# Patient Record
Sex: Female | Born: 1940 | Race: White | Hispanic: No | Smoking: Never smoker
Health system: Southern US, Community
[De-identification: ages and names within clinical notes are randomized; demographics above are authoritative.]

## PROBLEM LIST (undated history)

## (undated) DIAGNOSIS — R569 Unspecified convulsions: Secondary | ICD-10-CM

## (undated) DIAGNOSIS — R4701 Aphasia: Secondary | ICD-10-CM

## (undated) DIAGNOSIS — K219 Gastro-esophageal reflux disease without esophagitis: Secondary | ICD-10-CM

## (undated) DIAGNOSIS — I10 Essential (primary) hypertension: Secondary | ICD-10-CM

## (undated) DIAGNOSIS — I5032 Chronic diastolic (congestive) heart failure: Secondary | ICD-10-CM

## (undated) DIAGNOSIS — R7881 Bacteremia: Secondary | ICD-10-CM

## (undated) DIAGNOSIS — E785 Hyperlipidemia, unspecified: Secondary | ICD-10-CM

## (undated) DIAGNOSIS — H3500 Unspecified background retinopathy: Secondary | ICD-10-CM

## (undated) DIAGNOSIS — Z8489 Family history of other specified conditions: Secondary | ICD-10-CM

## (undated) DIAGNOSIS — K625 Hemorrhage of anus and rectum: Secondary | ICD-10-CM

## (undated) DIAGNOSIS — G40901 Epilepsy, unspecified, not intractable, with status epilepticus: Secondary | ICD-10-CM

## (undated) DIAGNOSIS — I639 Cerebral infarction, unspecified: Secondary | ICD-10-CM

## (undated) HISTORY — DX: Cerebral infarction, unspecified: I63.9

## (undated) HISTORY — PX: OTHER SURGICAL HISTORY: SHX169

## (undated) HISTORY — PX: TONSILLECTOMY: SUR1361

## (undated) HISTORY — DX: Unspecified background retinopathy: H35.00

## (undated) HISTORY — DX: Hemorrhage of anus and rectum: K62.5

---

## 1970-08-18 HISTORY — PX: ABDOMINAL HYSTERECTOMY: SHX81

## 1970-08-18 HISTORY — PX: APPENDECTOMY: SHX54

## 1998-09-13 ENCOUNTER — Encounter: Admission: RE | Admit: 1998-09-13 | Discharge: 1998-09-13 | Payer: Self-pay | Admitting: Family Medicine

## 1998-09-13 ENCOUNTER — Encounter (INDEPENDENT_AMBULATORY_CARE_PROVIDER_SITE_OTHER): Payer: Self-pay | Admitting: *Deleted

## 1998-09-14 ENCOUNTER — Encounter: Admission: RE | Admit: 1998-09-14 | Discharge: 1998-09-14 | Payer: Self-pay | Admitting: Family Medicine

## 1998-11-09 ENCOUNTER — Ambulatory Visit (HOSPITAL_COMMUNITY): Admission: RE | Admit: 1998-11-09 | Discharge: 1998-11-09 | Payer: Self-pay

## 1998-12-31 ENCOUNTER — Encounter: Admission: RE | Admit: 1998-12-31 | Discharge: 1998-12-31 | Payer: Self-pay | Admitting: Family Medicine

## 1999-01-04 ENCOUNTER — Encounter: Admission: RE | Admit: 1999-01-04 | Discharge: 1999-01-04 | Payer: Self-pay | Admitting: Family Medicine

## 1999-01-08 ENCOUNTER — Encounter: Admission: RE | Admit: 1999-01-08 | Discharge: 1999-01-08 | Payer: Self-pay | Admitting: Sports Medicine

## 1999-02-08 ENCOUNTER — Encounter: Admission: RE | Admit: 1999-02-08 | Discharge: 1999-02-08 | Payer: Self-pay | Admitting: Family Medicine

## 1999-02-15 ENCOUNTER — Encounter: Admission: RE | Admit: 1999-02-15 | Discharge: 1999-02-15 | Payer: Self-pay | Admitting: Family Medicine

## 1999-02-22 ENCOUNTER — Encounter: Admission: RE | Admit: 1999-02-22 | Discharge: 1999-02-22 | Payer: Self-pay | Admitting: Family Medicine

## 1999-03-08 ENCOUNTER — Encounter: Admission: RE | Admit: 1999-03-08 | Discharge: 1999-03-08 | Payer: Self-pay | Admitting: Family Medicine

## 1999-03-20 ENCOUNTER — Encounter: Admission: RE | Admit: 1999-03-20 | Discharge: 1999-06-18 | Payer: Self-pay | Admitting: *Deleted

## 1999-03-25 ENCOUNTER — Encounter: Admission: RE | Admit: 1999-03-25 | Discharge: 1999-03-25 | Payer: Self-pay | Admitting: Family Medicine

## 1999-04-15 ENCOUNTER — Encounter: Admission: RE | Admit: 1999-04-15 | Discharge: 1999-04-15 | Payer: Self-pay | Admitting: Family Medicine

## 1999-05-15 ENCOUNTER — Encounter: Admission: RE | Admit: 1999-05-15 | Discharge: 1999-05-15 | Payer: Self-pay | Admitting: Family Medicine

## 1999-06-13 ENCOUNTER — Encounter: Admission: RE | Admit: 1999-06-13 | Discharge: 1999-06-13 | Payer: Self-pay | Admitting: Family Medicine

## 1999-07-24 ENCOUNTER — Encounter: Admission: RE | Admit: 1999-07-24 | Discharge: 1999-07-24 | Payer: Self-pay | Admitting: Family Medicine

## 1999-08-21 ENCOUNTER — Encounter: Admission: RE | Admit: 1999-08-21 | Discharge: 1999-08-21 | Payer: Self-pay | Admitting: Family Medicine

## 1999-10-09 ENCOUNTER — Encounter: Admission: RE | Admit: 1999-10-09 | Discharge: 1999-10-09 | Payer: Self-pay | Admitting: Family Medicine

## 1999-11-13 ENCOUNTER — Encounter: Admission: RE | Admit: 1999-11-13 | Discharge: 1999-11-13 | Payer: Self-pay | Admitting: Family Medicine

## 1999-12-17 ENCOUNTER — Encounter: Admission: RE | Admit: 1999-12-17 | Discharge: 1999-12-17 | Payer: Self-pay | Admitting: Sports Medicine

## 2000-03-16 ENCOUNTER — Encounter: Admission: RE | Admit: 2000-03-16 | Discharge: 2000-03-16 | Payer: Self-pay | Admitting: Family Medicine

## 2000-05-11 ENCOUNTER — Encounter: Admission: RE | Admit: 2000-05-11 | Discharge: 2000-05-11 | Payer: Self-pay | Admitting: *Deleted

## 2000-05-14 ENCOUNTER — Encounter: Admission: RE | Admit: 2000-05-14 | Discharge: 2000-05-14 | Payer: Self-pay | Admitting: *Deleted

## 2000-06-16 ENCOUNTER — Encounter: Admission: RE | Admit: 2000-06-16 | Discharge: 2000-06-16 | Payer: Self-pay | Admitting: Family Medicine

## 2000-06-26 ENCOUNTER — Encounter: Admission: RE | Admit: 2000-06-26 | Discharge: 2000-06-26 | Payer: Self-pay | Admitting: Family Medicine

## 2000-07-02 ENCOUNTER — Encounter: Admission: RE | Admit: 2000-07-02 | Discharge: 2000-07-02 | Payer: Self-pay | Admitting: Family Medicine

## 2000-07-16 ENCOUNTER — Encounter: Admission: RE | Admit: 2000-07-16 | Discharge: 2000-07-16 | Payer: Self-pay | Admitting: Sports Medicine

## 2000-09-16 ENCOUNTER — Encounter: Admission: RE | Admit: 2000-09-16 | Discharge: 2000-09-16 | Payer: Self-pay | Admitting: Family Medicine

## 2000-12-14 ENCOUNTER — Encounter: Admission: RE | Admit: 2000-12-14 | Discharge: 2000-12-14 | Payer: Self-pay | Admitting: Family Medicine

## 2001-03-17 ENCOUNTER — Encounter: Admission: RE | Admit: 2001-03-17 | Discharge: 2001-03-17 | Payer: Self-pay | Admitting: Family Medicine

## 2001-05-03 ENCOUNTER — Encounter: Admission: RE | Admit: 2001-05-03 | Discharge: 2001-05-03 | Payer: Self-pay | Admitting: Sports Medicine

## 2001-05-03 ENCOUNTER — Encounter: Payer: Self-pay | Admitting: Sports Medicine

## 2001-06-21 ENCOUNTER — Encounter: Admission: RE | Admit: 2001-06-21 | Discharge: 2001-06-21 | Payer: Self-pay | Admitting: Family Medicine

## 2001-07-01 ENCOUNTER — Encounter: Admission: RE | Admit: 2001-07-01 | Discharge: 2001-07-01 | Payer: Self-pay | Admitting: Family Medicine

## 2001-10-13 ENCOUNTER — Encounter: Admission: RE | Admit: 2001-10-13 | Discharge: 2001-10-13 | Payer: Self-pay | Admitting: Family Medicine

## 2002-02-04 ENCOUNTER — Encounter: Admission: RE | Admit: 2002-02-04 | Discharge: 2002-02-04 | Payer: Self-pay | Admitting: Family Medicine

## 2002-04-25 ENCOUNTER — Encounter: Admission: RE | Admit: 2002-04-25 | Discharge: 2002-04-25 | Payer: Self-pay | Admitting: Family Medicine

## 2002-05-04 ENCOUNTER — Ambulatory Visit (HOSPITAL_COMMUNITY): Admission: RE | Admit: 2002-05-04 | Discharge: 2002-05-04 | Payer: Self-pay | Admitting: Family Medicine

## 2002-07-25 ENCOUNTER — Encounter: Admission: RE | Admit: 2002-07-25 | Discharge: 2002-07-25 | Payer: Self-pay | Admitting: Family Medicine

## 2002-07-28 ENCOUNTER — Encounter: Admission: RE | Admit: 2002-07-28 | Discharge: 2002-07-28 | Payer: Self-pay | Admitting: Family Medicine

## 2002-08-19 ENCOUNTER — Encounter: Admission: RE | Admit: 2002-08-19 | Discharge: 2002-08-19 | Payer: Self-pay | Admitting: Family Medicine

## 2002-09-01 ENCOUNTER — Encounter: Admission: RE | Admit: 2002-09-01 | Discharge: 2002-09-01 | Payer: Self-pay | Admitting: Family Medicine

## 2003-01-17 ENCOUNTER — Encounter: Admission: RE | Admit: 2003-01-17 | Discharge: 2003-01-17 | Payer: Self-pay | Admitting: Sports Medicine

## 2003-12-21 ENCOUNTER — Ambulatory Visit (HOSPITAL_COMMUNITY): Admission: RE | Admit: 2003-12-21 | Discharge: 2003-12-21 | Payer: Self-pay | Admitting: Sports Medicine

## 2004-01-02 ENCOUNTER — Encounter: Admission: RE | Admit: 2004-01-02 | Discharge: 2004-01-02 | Payer: Self-pay | Admitting: Sports Medicine

## 2004-01-31 ENCOUNTER — Encounter: Admission: RE | Admit: 2004-01-31 | Discharge: 2004-01-31 | Payer: Self-pay | Admitting: Family Medicine

## 2004-03-19 ENCOUNTER — Encounter: Admission: RE | Admit: 2004-03-19 | Discharge: 2004-03-19 | Payer: Self-pay | Admitting: Family Medicine

## 2004-05-15 ENCOUNTER — Ambulatory Visit: Payer: Self-pay | Admitting: Sports Medicine

## 2004-06-26 ENCOUNTER — Ambulatory Visit: Payer: Self-pay | Admitting: Family Medicine

## 2004-07-16 ENCOUNTER — Ambulatory Visit: Payer: Self-pay | Admitting: Sports Medicine

## 2004-09-11 ENCOUNTER — Ambulatory Visit: Payer: Self-pay | Admitting: Sports Medicine

## 2004-11-26 ENCOUNTER — Ambulatory Visit: Payer: Self-pay | Admitting: Sports Medicine

## 2005-01-21 ENCOUNTER — Ambulatory Visit: Payer: Self-pay | Admitting: Sports Medicine

## 2005-02-05 ENCOUNTER — Ambulatory Visit (HOSPITAL_COMMUNITY): Admission: RE | Admit: 2005-02-05 | Discharge: 2005-02-05 | Payer: Self-pay

## 2005-04-25 ENCOUNTER — Ambulatory Visit (HOSPITAL_COMMUNITY): Admission: RE | Admit: 2005-04-25 | Discharge: 2005-04-25 | Payer: Self-pay | Admitting: Internal Medicine

## 2005-05-20 ENCOUNTER — Ambulatory Visit: Payer: Self-pay | Admitting: Sports Medicine

## 2005-09-30 ENCOUNTER — Ambulatory Visit: Payer: Self-pay | Admitting: Sports Medicine

## 2005-10-02 ENCOUNTER — Ambulatory Visit: Payer: Self-pay | Admitting: Family Medicine

## 2005-10-15 ENCOUNTER — Encounter: Admission: RE | Admit: 2005-10-15 | Discharge: 2005-10-15 | Payer: Self-pay | Admitting: Gastroenterology

## 2006-05-26 ENCOUNTER — Ambulatory Visit (HOSPITAL_COMMUNITY): Admission: RE | Admit: 2006-05-26 | Discharge: 2006-05-26 | Payer: Self-pay | Admitting: Sports Medicine

## 2006-10-15 DIAGNOSIS — I10 Essential (primary) hypertension: Secondary | ICD-10-CM | POA: Insufficient documentation

## 2006-10-15 DIAGNOSIS — E1165 Type 2 diabetes mellitus with hyperglycemia: Secondary | ICD-10-CM

## 2006-10-15 DIAGNOSIS — F329 Major depressive disorder, single episode, unspecified: Secondary | ICD-10-CM | POA: Insufficient documentation

## 2006-10-15 DIAGNOSIS — E785 Hyperlipidemia, unspecified: Secondary | ICD-10-CM

## 2006-10-15 DIAGNOSIS — IMO0001 Reserved for inherently not codable concepts without codable children: Secondary | ICD-10-CM | POA: Insufficient documentation

## 2006-10-15 HISTORY — DX: Hyperlipidemia, unspecified: E78.5

## 2006-10-16 ENCOUNTER — Encounter (INDEPENDENT_AMBULATORY_CARE_PROVIDER_SITE_OTHER): Payer: Self-pay | Admitting: *Deleted

## 2007-03-17 ENCOUNTER — Encounter: Payer: Self-pay | Admitting: Family Medicine

## 2007-04-12 ENCOUNTER — Encounter: Payer: Self-pay | Admitting: Family Medicine

## 2007-04-26 ENCOUNTER — Encounter: Payer: Self-pay | Admitting: Family Medicine

## 2007-04-26 ENCOUNTER — Ambulatory Visit: Payer: Self-pay | Admitting: Family Medicine

## 2007-04-26 LAB — CONVERTED CEMR LAB
ALT: 19 units/L (ref 0–35)
AST: 22 units/L (ref 0–37)
CO2: 23 meq/L (ref 19–32)
Calcium: 9.5 mg/dL (ref 8.4–10.5)
Creatinine, Ser: 0.64 mg/dL (ref 0.40–1.20)
Potassium: 4.1 meq/L (ref 3.5–5.3)
Sodium: 137 meq/L (ref 135–145)

## 2007-04-27 ENCOUNTER — Encounter: Payer: Self-pay | Admitting: Family Medicine

## 2007-04-28 ENCOUNTER — Telehealth: Payer: Self-pay | Admitting: Family Medicine

## 2007-05-14 ENCOUNTER — Encounter: Payer: Self-pay | Admitting: Family Medicine

## 2007-05-27 ENCOUNTER — Ambulatory Visit (HOSPITAL_COMMUNITY): Admission: RE | Admit: 2007-05-27 | Discharge: 2007-05-27 | Payer: Self-pay | Admitting: Family Medicine

## 2007-09-27 ENCOUNTER — Encounter: Payer: Self-pay | Admitting: Family Medicine

## 2009-02-26 ENCOUNTER — Ambulatory Visit (HOSPITAL_COMMUNITY): Admission: RE | Admit: 2009-02-26 | Discharge: 2009-02-26 | Payer: Self-pay | Admitting: Internal Medicine

## 2009-05-11 ENCOUNTER — Encounter: Payer: Self-pay | Admitting: Family Medicine

## 2010-03-02 ENCOUNTER — Emergency Department (HOSPITAL_COMMUNITY): Admission: EM | Admit: 2010-03-02 | Discharge: 2010-03-02 | Payer: Self-pay | Admitting: Emergency Medicine

## 2010-03-29 ENCOUNTER — Ambulatory Visit (HOSPITAL_COMMUNITY): Admission: RE | Admit: 2010-03-29 | Discharge: 2010-03-29 | Payer: Self-pay | Admitting: Internal Medicine

## 2010-09-19 NOTE — Miscellaneous (Signed)
Summary: no longer pt here-no refills  Clinical Lists Changes called pt to see what she is taking & who is rxing it. she sees an endocrinologist. she went off avandia years ago when she first heard there were problems with it. she sees another md & will not be coming back here. FYI to pcp.Golden Circle RN  May 11, 2009 1:58 PM

## 2011-09-09 ENCOUNTER — Emergency Department (INDEPENDENT_AMBULATORY_CARE_PROVIDER_SITE_OTHER)
Admission: EM | Admit: 2011-09-09 | Discharge: 2011-09-09 | Disposition: A | Discharge status: Home / Self Care | Payer: Medicare Other | Source: Home / Self Care

## 2011-09-09 ENCOUNTER — Encounter (HOSPITAL_COMMUNITY): Payer: Self-pay | Admitting: Emergency Medicine

## 2011-09-09 ENCOUNTER — Emergency Department (INDEPENDENT_AMBULATORY_CARE_PROVIDER_SITE_OTHER): Payer: Medicare Other

## 2011-09-09 DIAGNOSIS — I10 Essential (primary) hypertension: Secondary | ICD-10-CM

## 2011-09-09 DIAGNOSIS — S42309A Unspecified fracture of shaft of humerus, unspecified arm, initial encounter for closed fracture: Secondary | ICD-10-CM

## 2011-09-09 DIAGNOSIS — S42353A Displaced comminuted fracture of shaft of humerus, unspecified arm, initial encounter for closed fracture: Secondary | ICD-10-CM

## 2011-09-09 HISTORY — DX: Essential (primary) hypertension: I10

## 2011-09-09 MED ORDER — OLMESARTAN MEDOXOMIL 20 MG PO TABS: 20.0000 mg | ORAL_TABLET | Freq: Every day | ORAL | Status: DC

## 2011-09-09 MED ORDER — TRAMADOL HCL 50 MG PO TABS: 50.0000 mg | ORAL_TABLET | Freq: Four times a day (QID) | ORAL | Status: AC | PRN

## 2011-09-09 NOTE — ED Notes (Signed)
Bed:[facility]05<BR> Expected date:<BR> Expected time:<BR> Means of arrival:<BR> Comments:<BR>

## 2011-09-09 NOTE — ED Provider Notes (Signed)
History     CSN: [id number]  Arrival date & time 09/09/11  1656   None     Chief Complaint  Patient presents with  . Fall  . Arm Injury    (Consider location/radiation/quality/duration/timing/severity/associated sxs/prior treatment) HPI Comments: Pt states she slipped and fell outside 2 days ago while chasing her dog. She noticed right away that she was unable to carry the dog with her Lt arm after the fall and since has bruising, swelling and mild pain. She states that most of her discomfort is at night trying to find a comfortable position. She also is unable to move her shoulder due to discomfort. She has been taking Ibuprofen for discomfort. Pt has a hx of DM and HTN. She has not taken her medications in approx 6 mos. She states that she monitors her BP at home and is not this elevated - systolic usually 409W to 140s. She also checks her blood sugars periodically and run 110 to 120s. She wants to find a new PCP, though admits she has not made an effort to do so. She denies chest pain, dyspnea, HA or dizziness.    Past Medical History  Diagnosis Date  . Diabetes mellitus   . Hypertension     History reviewed. No pertinent past surgical history.  No family history on file.  History  Substance Use Topics  . Smoking status: Never Smoker   . Smokeless tobacco: Not on file  . Alcohol Use: No    OB History    Grav Para Term Preterm Abortions TAB SAB Ect Mult Living                  Review of Systems  Musculoskeletal: Positive for joint swelling.  Skin: Negative for wound.  Neurological: Negative for dizziness, numbness and headaches.    Allergies  Codeine  Home Medications   Current Outpatient Rx  Name Route Sig Dispense Refill  . METFORMIN HCL 1000 MG PO TABS Oral Take 1,000 mg by mouth 2 (two) times daily with a meal. HASN'T TAKEN IN 6 MNTHS    . OLMESARTAN MEDOXOMIL 20 MG PO TABS Oral Take 1 tablet (20 mg total) by mouth daily. 14 tablet 0  . TRAMADOL HCL 50  MG PO TABS Oral Take 1 tablet (50 mg total) by mouth every 6 (six) hours as needed for pain. 12 tablet 0    BP 202/102  Pulse 110  Temp(Src) 97.7 F (36.5 C) (Oral)  Resp 16  SpO2 95%  Physical Exam  Nursing note and vitals reviewed. Constitutional: She appears well-developed and well-nourished. No distress.  Cardiovascular: Normal rate, regular rhythm and normal heart sounds.   Pulses:      Radial pulses are 2+ on the right side, and 2+ on the left side.  Pulmonary/Chest: Effort normal and breath sounds normal. No respiratory distress.  Musculoskeletal:       Left shoulder: She exhibits decreased range of motion, bony tenderness (TTP superior humerus and humeral head), swelling and decreased strength. She exhibits no laceration and normal pulse.  Neurological: She is alert.  Skin: Skin is warm and dry. No erythema.  Psychiatric: She has a normal mood and affect.    ED Course  Procedures (including critical care time)  Labs Reviewed - No data to display No results found.   1. Comminuted fracture of humerus   2. Hypertension       MDM  Xray reviewed by myself and radiologist.  Discussed with Dr Luiz Blare.  Advised to sling pt and will f/u in office.  Encouraged pt f/u with current PCP regarding BP and DM until she is able to obtain a new PCP as she desires.        Melody Comas, [place] 09/18/11 0830

## 2011-09-09 NOTE — ED Notes (Signed)
HERE WITH LEFT UPPER ARM BRUISING AND PAIN S/P FALL X 2 DYS AGO.PT STATES SHE WAS RUNNING AFTER DOG IN [place] WHEN SHE SLIPPED ON PATCHY ICE AND FELL STRAIGHT ON LEFT ARM.UNABLE TO FULLY EXTEND ARM.PT TRIED IBUPROFEN AND REPOSITIONING BUT NO RELIEF.WENT TO [facility] AND WAS TOLD TO COME HERE.NO HEAD OR BACK PAIN REPORTED.PT ALSO HAS HX HTN AND DIABETES AND HASN'T TAKEN MEDS IN 6 MNTHS.NO C/O H/A OR CP.BP 191/106

## 2011-09-19 NOTE — ED Provider Notes (Signed)
Medical screening examination/treatment/procedure(s) were performed by non-physician practitioner and as supervising physician I was immediately available for consultation/collaboration.  Raynald Blend, MD 09/19/11 [phone]

## 2013-05-06 ENCOUNTER — Ambulatory Visit (INDEPENDENT_AMBULATORY_CARE_PROVIDER_SITE_OTHER): Payer: Medicare Other | Admitting: Family

## 2013-05-06 ENCOUNTER — Encounter: Payer: Self-pay | Admitting: Family

## 2013-05-06 VITALS — BP 158/86 | HR 95 | Temp 98.5°F | Resp 16 | Ht <= 58 in | Wt 120.0 lb

## 2013-05-06 DIAGNOSIS — K219 Gastro-esophageal reflux disease without esophagitis: Secondary | ICD-10-CM

## 2013-05-06 DIAGNOSIS — Z23 Encounter for immunization: Secondary | ICD-10-CM

## 2013-05-06 DIAGNOSIS — I10 Essential (primary) hypertension: Secondary | ICD-10-CM

## 2013-05-06 DIAGNOSIS — F339 Major depressive disorder, recurrent, unspecified: Secondary | ICD-10-CM

## 2013-05-06 DIAGNOSIS — E785 Hyperlipidemia, unspecified: Secondary | ICD-10-CM

## 2013-05-06 DIAGNOSIS — E119 Type 2 diabetes mellitus without complications: Secondary | ICD-10-CM

## 2013-05-06 HISTORY — DX: Gastro-esophageal reflux disease without esophagitis: K21.9

## 2013-05-06 LAB — HEMOGLOBIN A1C: Mean Plasma Glucose: 260 mg/dL — ABNORMAL HIGH (ref <117)

## 2013-05-06 LAB — BASIC METABOLIC PANEL
BUN: 7 mg/dL (ref 6–23)
CO2: 30 mEq/L (ref 19–32)
Glucose, Bld: 192 mg/dL — ABNORMAL HIGH (ref 70–99)
Sodium: 139 mEq/L (ref 135–145)

## 2013-05-06 MED ORDER — AMLODIPINE BESYLATE 5 MG PO TABS: 5.0000 mg | ORAL_TABLET | Freq: Every day | ORAL | Status: DC

## 2013-05-06 NOTE — Assessment & Plan Note (Signed)
BP high here today and she reports that bp is "always high." Will add amlodipine to hyzaar.

## 2013-05-06 NOTE — Assessment & Plan Note (Signed)
Pt reports depression is currently well controlled and denies significant pmhx of depression.

## 2013-05-06 NOTE — Progress Notes (Signed)
Subjective:    Patient ID: April [name], female    DOB: 06/12/1941, 72 y.o.   MRN: [id number]  HPI  April [name] is a 72 yr old female who presents today to establish care. Her previous provider Dr. Bascom Levels, retired.  1) DM2- Currently maintained on metformin. Reports that she was diagnosed about 15 years ago. Reports that this is well controlled.    2) HTN- She is maintained on hyzaar. She denies cp/sob or swellings. She reports that her blood pressure "runs high."    3) GERD- reports that she uses prilosec otc prn.  4) Depression- reports that she had some issue after retiring, but that this is not an issue.    5) Hyperlipidemia- non fasting, has never been on statin.   Review of Systems  Constitutional: Negative for unexpected weight change.  HENT: Negative for congestion.        Reports that she has some trouble hearing a word or two in the movies- declines audiology referral at this time  Eyes: Negative for visual disturbance.  Respiratory: Negative for shortness of breath.   Cardiovascular: Negative for chest pain.  Gastrointestinal: Negative for nausea and vomiting.  Genitourinary: Negative for dysuria and frequency.  Musculoskeletal: Negative for myalgias and arthralgias.  Skin: Negative for rash.  Neurological: Negative for headaches.  Hematological: Negative for adenopathy.  Psychiatric/Behavioral:       See HPI  Denies anxiety   Past Medical History  Diagnosis Date  . Diabetes mellitus   . Hypertension     History   Social History  . Marital Status: Widowed    Spouse Name: N/A    Number of Children: N/A  . Years of Education: N/A   Occupational History  . Not on file.   Social History Main Topics  . Smoking status: Never Smoker   . Smokeless tobacco: Never Used  . Alcohol Use: No  . Drug Use: No  . Sexual Activity: Not on file   Other Topics Concern  . Not on file   Social History Narrative   Widowed, husband died in 01/21/2002.   1 son age  33- lives in [place]   Enjoys reading, playing with her dogs, walking, movies (likes Technical brewer)   1 year of college   Retired from [employer], [employer]          Past Surgical History  Procedure Laterality Date  . Appendectomy  1972  . Abdominal hysterectomy  1972    Family History  Problem Relation Age of Onset  . Arthritis Maternal Grandmother   . Heart disease Maternal Grandmother 80    Allergies  Allergen Reactions  . Codeine Nausea And Vomiting  . Sulfa Antibiotics Nausea And Vomiting    No current outpatient prescriptions on file prior to visit.   No current facility-administered medications on file prior to visit.    BP 158/86  Pulse 95  Temp(Src) 98.5 F (36.9 C) (Oral)  Resp 16  Ht 4\' 10"  (1.473 m)  Wt 120 lb 0.6 oz (54.45 kg)  BMI 25.1 kg/m2  SpO2 98%  LMP 08/18/1970        Objective:   Physical Exam  Constitutional: She is oriented to person, place, and time. She appears well-developed and well-nourished. No distress.  HENT:  Head: Normocephalic and atraumatic.  Cardiovascular: Normal rate and regular rhythm.   No murmur heard. Pulmonary/Chest: Effort normal and breath sounds normal. No respiratory distress. She has no wheezes. She has no rales. She exhibits  no tenderness.  Musculoskeletal: She exhibits no edema.  Lymphadenopathy:    She has no cervical adenopathy.  Neurological: She is alert and oriented to person, place, and time.  Psychiatric: She has a normal mood and affect. Her behavior is normal. Judgment and thought content normal.          Assessment & Plan:

## 2013-05-06 NOTE — Assessment & Plan Note (Signed)
Not on statin, plan flp next visit.

## 2013-05-06 NOTE — Assessment & Plan Note (Signed)
GERD symptoms stable on PRN prilosec.

## 2013-05-06 NOTE — Patient Instructions (Addendum)
Please complete your lab work prior to leaving. Start amlodipine to help with your blood pressure. Follow up in 1 month for a fasting wellness visit. Welcome to [facility]!

## 2013-05-06 NOTE — Assessment & Plan Note (Signed)
Will refer for dm eye exam, obtain A1C, urine microalbumin, add ASA for cardiac prevention, continue metformin.

## 2013-05-07 LAB — MICROALBUMIN / CREATININE URINE RATIO
Creatinine, Urine: 176.9 mg/dL
Microalb Creat Ratio: 69.1 mg/g — ABNORMAL HIGH (ref 0.0–30.0)
Microalb, Ur: 12.22 mg/dL — ABNORMAL HIGH (ref 0.00–1.89)

## 2013-05-08 ENCOUNTER — Telehealth: Payer: Self-pay | Admitting: Family

## 2013-05-08 DIAGNOSIS — E876 Hypokalemia: Secondary | ICD-10-CM

## 2013-05-08 MED ORDER — SITAGLIPTIN PHOSPHATE 100 MG PO TABS: 100.0000 mg | ORAL_TABLET | Freq: Every day | ORAL | Status: DC

## 2013-05-08 MED ORDER — METFORMIN HCL 500 MG PO TABS: 1000.0000 mg | ORAL_TABLET | Freq: Two times a day (BID) | ORAL | Status: DC

## 2013-05-08 MED ORDER — POTASSIUM CHLORIDE CRYS ER 20 MEQ PO TBCR: 20.0000 meq | EXTENDED_RELEASE_TABLET | Freq: Every day | ORAL | Status: DC

## 2013-05-08 NOTE — Telephone Encounter (Signed)
Please call pt and let her know that her diabetes is very poorly controlled.  I would like for her to increase her metformin from 500 mg bid to 1000mg  bid.  Also, I would like her to start [place] once daily. Potassium is low.  I would like her to add kdur once daily. Repeat bmet in 1 week, dx is hypokalemia.

## 2013-05-09 ENCOUNTER — Other Ambulatory Visit: Payer: Self-pay | Admitting: Family Medicine

## 2013-05-09 NOTE — Telephone Encounter (Signed)
Januvia is 100mg .

## 2013-05-09 NOTE — Telephone Encounter (Signed)
What mg of Januvia?

## 2013-05-10 NOTE — Telephone Encounter (Signed)
Notified pt and she voices understanding. Lab order placed.

## 2013-05-18 ENCOUNTER — Encounter: Payer: Self-pay | Admitting: Family

## 2013-05-18 LAB — BASIC METABOLIC PANEL
CO2: 29 mEq/L (ref 19–32)
Calcium: 9.4 mg/dL (ref 8.4–10.5)
Potassium: 4 mEq/L (ref 3.5–5.3)

## 2013-05-31 ENCOUNTER — Ambulatory Visit (INDEPENDENT_AMBULATORY_CARE_PROVIDER_SITE_OTHER): Payer: Medicare Other | Admitting: Family

## 2013-05-31 ENCOUNTER — Ambulatory Visit: Payer: Medicare Other | Admitting: Family

## 2013-05-31 ENCOUNTER — Encounter: Payer: Self-pay | Admitting: Family

## 2013-05-31 VITALS — BP 142/88 | HR 99 | Temp 97.9°F | Resp 16 | Ht <= 58 in | Wt 120.0 lb

## 2013-05-31 DIAGNOSIS — E785 Hyperlipidemia, unspecified: Secondary | ICD-10-CM

## 2013-05-31 DIAGNOSIS — I10 Essential (primary) hypertension: Secondary | ICD-10-CM

## 2013-05-31 DIAGNOSIS — E119 Type 2 diabetes mellitus without complications: Secondary | ICD-10-CM

## 2013-05-31 LAB — HEPATIC FUNCTION PANEL
ALT: 10 U/L (ref 0–35)
Indirect Bilirubin: 0.3 mg/dL (ref 0.0–0.9)

## 2013-05-31 LAB — LIPID PANEL
Cholesterol: 155 mg/dL (ref 0–200)
Total CHOL/HDL Ratio: 4.2 Ratio
Triglycerides: 335 mg/dL — ABNORMAL HIGH (ref <150)

## 2013-05-31 MED ORDER — AMLODIPINE BESYLATE 10 MG PO TABS: 10.0000 mg | ORAL_TABLET | Freq: Every day | ORAL | Status: DC

## 2013-05-31 NOTE — Patient Instructions (Addendum)
Please schedule a wellness visit at the front desk after 12/19. Increase amlodipine form 5 mg to 10 mg.   Follow up in 1 month for a nurse visit to check your blood pressure.

## 2013-05-31 NOTE — Assessment & Plan Note (Signed)
She is fasting today.  Obtain lipid panel and lft.

## 2013-05-31 NOTE — Assessment & Plan Note (Signed)
Tolerating [place]. Continue same along with metformin. Plan A1C next visit.  Advised pt to start checking sugars and check at least once a day.

## 2013-05-31 NOTE — Assessment & Plan Note (Signed)
Improving but still above goal of 130/80. Increase amlodipine from 5 to 10mg .

## 2013-05-31 NOTE — Progress Notes (Signed)
Subjective:    Patient ID: April [name], female    DOB: 23-Jul-1941, 72 y.o.   MRN: [id number]  HPI  April [name] is a 72 yr old female who presents today for follow up of her HTN. Last visit amlodipine was added to her hyzaar.  Tolerating amlodipine without difficulty.     DM2- Started januvia- not checking sugars. Tolerating without difficulty.   Hyperlipidemia- reports that she is fasting today. Would like cholesterol checked.   Review of Systems  Respiratory: Negative for shortness of breath.   Cardiovascular: Negative for chest pain and leg swelling.   Past Medical History  Diagnosis Date  . Diabetes mellitus   . Hypertension     History   Social History  . Marital Status: Widowed    Spouse Name: N/A    Number of Children: N/A  . Years of Education: N/A   Occupational History  . Not on file.   Social History Main Topics  . Smoking status: Never Smoker   . Smokeless tobacco: Never Used  . Alcohol Use: No  . Drug Use: No  . Sexual Activity: Not on file   Other Topics Concern  . Not on file   Social History Narrative   Widowed, husband died in 01-20-2002.   1 son age 36- lives in [place]   Enjoys reading, playing with her dogs, walking, movies (likes Technical brewer)   1 year of college   Retired from [employer], [employer]          Past Surgical History  Procedure Laterality Date  . Appendectomy  1972  . Abdominal hysterectomy  1972    Family History  Problem Relation Age of Onset  . Arthritis Maternal Grandmother   . Heart disease Maternal Grandmother 80    Allergies  Allergen Reactions  . Codeine Nausea And Vomiting  . Sulfa Antibiotics Nausea And Vomiting    Current Outpatient Prescriptions on File Prior to Visit  Medication Sig Dispense Refill  . aspirin EC 81 MG tablet Take 81 mg by mouth daily.      Marland Kitchen losartan-hydrochlorothiazide (HYZAAR) 50-12.5 MG per tablet Take 1 tablet by mouth daily.      . metFORMIN (GLUCOPHAGE) 500 MG tablet  Take 2 tablets (1,000 mg total) by mouth 2 (two) times daily with a meal.  120 tablet  2  . omeprazole (PRILOSEC OTC) 20 MG tablet Take 20 mg by mouth as needed.      . potassium chloride SA (K-DUR,KLOR-CON) 20 MEQ tablet Take 1 tablet (20 mEq total) by mouth daily.  30 tablet  3  . sitaGLIPtin (JANUVIA) 100 MG tablet Take 1 tablet (100 mg total) by mouth daily.  30 tablet  2   No current facility-administered medications on file prior to visit.    BP 142/88  Pulse 99  Temp(Src) 97.9 F (36.6 C) (Oral)  Resp 16  Ht 4\' 10"  (1.473 m)  Wt 120 lb (54.432 kg)  BMI 25.09 kg/m2  SpO2 99%  LMP 08/18/1970       Objective:   Physical Exam  Constitutional: She is oriented to person, place, and time. She appears well-developed and well-nourished. No distress.  HENT:  Head: Normocephalic and atraumatic.  Cardiovascular: Normal rate and regular rhythm.   No murmur heard. Pulmonary/Chest: Effort normal and breath sounds normal. No respiratory distress. She has no wheezes. She has no rales. She exhibits no tenderness.  Musculoskeletal: She exhibits no edema.  Neurological: She is alert and oriented to  person, place, and time.  Psychiatric: She has a normal mood and affect. Her behavior is normal. Judgment and thought content normal.          Assessment & Plan:   BP Readings from Last 3 Encounters:  05/31/13 142/88  05/06/13 158/86  09/09/11 202/102

## 2013-06-02 ENCOUNTER — Telehealth: Payer: Self-pay | Admitting: Family

## 2013-06-02 DIAGNOSIS — E781 Pure hyperglyceridemia: Secondary | ICD-10-CM

## 2013-06-02 NOTE — Telephone Encounter (Signed)
Triglycerides are high.  Avoid concentrated sweets, white fluffy carbs. Add fish oil 2000mg  twice daily. Repeat flp in 3 months, dx hypertriglyceridemia.

## 2013-06-03 NOTE — Telephone Encounter (Signed)
Left message on voicemail to return my call.  

## 2013-06-03 NOTE — Telephone Encounter (Signed)
Notified pt and she voices understanding. Lab order entered. 

## 2013-06-06 ENCOUNTER — Encounter: Payer: Self-pay | Admitting: Family

## 2013-06-06 DIAGNOSIS — H3500 Unspecified background retinopathy: Secondary | ICD-10-CM

## 2013-06-06 HISTORY — DX: Unspecified background retinopathy: H35.00

## 2013-07-01 ENCOUNTER — Ambulatory Visit: Payer: Medicare Other

## 2013-07-27 ENCOUNTER — Telehealth: Payer: Self-pay | Admitting: Family

## 2013-07-27 MED ORDER — LOSARTAN POTASSIUM-HCTZ 50-12.5 MG PO TABS: 1.0000 | ORAL_TABLET | Freq: Every day | ORAL | Status: DC

## 2013-07-27 NOTE — Telephone Encounter (Signed)
Losartan hctz 50-12.5 mg tab take 1 tablet by mouth every day qty 30

## 2013-07-27 NOTE — Telephone Encounter (Signed)
Reill sent to pharmacy.

## 2013-08-08 ENCOUNTER — Ambulatory Visit: Payer: Medicare Other | Admitting: Family

## 2013-08-15 ENCOUNTER — Other Ambulatory Visit: Payer: Self-pay | Admitting: Family

## 2013-08-15 DIAGNOSIS — I10 Essential (primary) hypertension: Secondary | ICD-10-CM

## 2014-02-28 ENCOUNTER — Telehealth: Payer: Self-pay

## 2014-02-28 DIAGNOSIS — E785 Hyperlipidemia, unspecified: Secondary | ICD-10-CM

## 2014-02-28 DIAGNOSIS — E119 Type 2 diabetes mellitus without complications: Secondary | ICD-10-CM

## 2014-02-28 NOTE — Telephone Encounter (Signed)
Diabetic Bundle- Left a detailed message on patients vm asking her to give [place] a call.  Pt needs an appt with melissa and to get ldl and a1c checked.  Labs ordered

## 2014-04-17 ENCOUNTER — Telehealth: Payer: Self-pay | Admitting: Family

## 2014-04-17 NOTE — Telephone Encounter (Signed)
Tried to contact patient to schedule cpe.  Looks like last cpe was 05/06/2013.  Phone rang no answer or answering service.

## 2014-08-18 HISTORY — PX: EYE SURGERY: SHX253

## 2014-10-21 ENCOUNTER — Inpatient Hospital Stay (HOSPITAL_COMMUNITY)
Admission: EM | Admit: 2014-10-21 | Discharge: 2014-10-25 | DRG: 066 | Disposition: A | Discharge status: Skilled Nursing Facility | Payer: Medicare HMO | Attending: Internal Medicine | Admitting: Internal Medicine

## 2014-10-21 ENCOUNTER — Emergency Department (HOSPITAL_COMMUNITY): Payer: Medicare HMO

## 2014-10-21 ENCOUNTER — Encounter (HOSPITAL_COMMUNITY): Payer: Self-pay | Admitting: Physical Medicine and Rehabilitation

## 2014-10-21 ENCOUNTER — Inpatient Hospital Stay (HOSPITAL_COMMUNITY): Payer: Medicare HMO

## 2014-10-21 DIAGNOSIS — K219 Gastro-esophageal reflux disease without esophagitis: Secondary | ICD-10-CM | POA: Diagnosis present

## 2014-10-21 DIAGNOSIS — E1165 Type 2 diabetes mellitus with hyperglycemia: Secondary | ICD-10-CM | POA: Diagnosis present

## 2014-10-21 DIAGNOSIS — I639 Cerebral infarction, unspecified: Secondary | ICD-10-CM | POA: Diagnosis not present

## 2014-10-21 DIAGNOSIS — E785 Hyperlipidemia, unspecified: Secondary | ICD-10-CM | POA: Insufficient documentation

## 2014-10-21 DIAGNOSIS — Z7951 Long term (current) use of inhaled steroids: Secondary | ICD-10-CM | POA: Diagnosis not present

## 2014-10-21 DIAGNOSIS — J40 Bronchitis, not specified as acute or chronic: Secondary | ICD-10-CM | POA: Diagnosis present

## 2014-10-21 DIAGNOSIS — I10 Essential (primary) hypertension: Secondary | ICD-10-CM | POA: Diagnosis present

## 2014-10-21 DIAGNOSIS — R7881 Bacteremia: Secondary | ICD-10-CM | POA: Diagnosis not present

## 2014-10-21 DIAGNOSIS — I6340 Cerebral infarction due to embolism of unspecified cerebral artery: Principal | ICD-10-CM | POA: Diagnosis present

## 2014-10-21 DIAGNOSIS — F802 Mixed receptive-expressive language disorder: Secondary | ICD-10-CM | POA: Diagnosis present

## 2014-10-21 DIAGNOSIS — R4701 Aphasia: Secondary | ICD-10-CM | POA: Diagnosis present

## 2014-10-21 DIAGNOSIS — R1310 Dysphagia, unspecified: Secondary | ICD-10-CM | POA: Diagnosis present

## 2014-10-21 DIAGNOSIS — R339 Retention of urine, unspecified: Secondary | ICD-10-CM | POA: Diagnosis present

## 2014-10-21 DIAGNOSIS — E876 Hypokalemia: Secondary | ICD-10-CM | POA: Diagnosis present

## 2014-10-21 DIAGNOSIS — I739 Peripheral vascular disease, unspecified: Secondary | ICD-10-CM | POA: Diagnosis present

## 2014-10-21 DIAGNOSIS — R569 Unspecified convulsions: Secondary | ICD-10-CM | POA: Insufficient documentation

## 2014-10-21 DIAGNOSIS — Z7982 Long term (current) use of aspirin: Secondary | ICD-10-CM

## 2014-10-21 DIAGNOSIS — IMO0001 Reserved for inherently not codable concepts without codable children: Secondary | ICD-10-CM | POA: Diagnosis present

## 2014-10-21 DIAGNOSIS — E11319 Type 2 diabetes mellitus with unspecified diabetic retinopathy without macular edema: Secondary | ICD-10-CM | POA: Diagnosis present

## 2014-10-21 DIAGNOSIS — R509 Fever, unspecified: Secondary | ICD-10-CM

## 2014-10-21 DIAGNOSIS — R4182 Altered mental status, unspecified: Secondary | ICD-10-CM

## 2014-10-21 DIAGNOSIS — G40901 Epilepsy, unspecified, not intractable, with status epilepticus: Secondary | ICD-10-CM | POA: Diagnosis present

## 2014-10-21 DIAGNOSIS — G9340 Encephalopathy, unspecified: Secondary | ICD-10-CM | POA: Insufficient documentation

## 2014-10-21 DIAGNOSIS — I340 Nonrheumatic mitral (valve) insufficiency: Secondary | ICD-10-CM | POA: Diagnosis not present

## 2014-10-21 DIAGNOSIS — I6330 Cerebral infarction due to thrombosis of unspecified cerebral artery: Secondary | ICD-10-CM | POA: Insufficient documentation

## 2014-10-21 DIAGNOSIS — I38 Endocarditis, valve unspecified: Secondary | ICD-10-CM

## 2014-10-21 HISTORY — DX: Cerebral infarction, unspecified: I63.9

## 2014-10-21 LAB — COMPREHENSIVE METABOLIC PANEL
ALK PHOS: 68 U/L (ref 39–117)
ALT: 14 U/L (ref 0–35)
AST: 17 U/L (ref 0–37)
Albumin: 3.7 g/dL (ref 3.5–5.2)
Anion gap: 13 (ref 5–15)
BUN: 14 mg/dL (ref 6–23)
CALCIUM: 9.5 mg/dL (ref 8.4–10.5)
CO2: 34 mmol/L — ABNORMAL HIGH (ref 19–32)
Chloride: 90 mmol/L — ABNORMAL LOW (ref 96–112)
Creatinine, Ser: 0.64 mg/dL (ref 0.50–1.10)
GFR, EST NON AFRICAN AMERICAN: 86 mL/min — AB (ref >90)
Glucose, Bld: 364 mg/dL — ABNORMAL HIGH (ref 70–99)
POTASSIUM: 2.8 mmol/L — AB (ref 3.5–5.1)
Sodium: 137 mmol/L (ref 135–145)
Total Bilirubin: 1 mg/dL (ref 0.3–1.2)
Total Protein: 7.9 g/dL (ref 6.0–8.3)

## 2014-10-21 LAB — PROTIME-INR
INR: 1.03 (ref 0.00–1.49)
PROTHROMBIN TIME: 13.6 s (ref 11.6–15.2)

## 2014-10-21 LAB — CBC WITH DIFFERENTIAL/PLATELET
BASOS ABS: 0 10*3/uL (ref 0.0–0.1)
BASOS PCT: 0 % (ref 0–1)
EOS ABS: 0 10*3/uL (ref 0.0–0.7)
Eosinophils Relative: 0 % (ref 0–5)
HCT: 44.1 % (ref 36.0–46.0)
Hemoglobin: 15.6 g/dL — ABNORMAL HIGH (ref 12.0–15.0)
Lymphocytes Relative: 29 % (ref 12–46)
Lymphs Abs: 3.7 10*3/uL (ref 0.7–4.0)
MCH: 32 pg (ref 26.0–34.0)
MCHC: 35.4 g/dL (ref 30.0–36.0)
MCV: 90.4 fL (ref 78.0–100.0)
MONO ABS: 1.5 10*3/uL — AB (ref 0.1–1.0)
MONOS PCT: 12 % (ref 3–12)
NEUTROS PCT: 59 % (ref 43–77)
Neutro Abs: 7.2 10*3/uL (ref 1.7–7.7)
Platelets: 439 10*3/uL — ABNORMAL HIGH (ref 150–400)
RBC: 4.88 MIL/uL (ref 3.87–5.11)
RDW: 13.1 % (ref 11.5–15.5)
WBC: 12.4 10*3/uL — AB (ref 4.0–10.5)

## 2014-10-21 LAB — CBG MONITORING, ED: Glucose-Capillary: 327 mg/dL — ABNORMAL HIGH (ref 70–99)

## 2014-10-21 LAB — APTT: aPTT: 29 seconds (ref 24–37)

## 2014-10-21 MED ORDER — INSULIN ASPART 100 UNIT/ML ~~LOC~~ SOLN
0.0000 [IU] | SUBCUTANEOUS | Status: DC
  Administered 2014-10-21: 9 [IU] via SUBCUTANEOUS
  Administered 2014-10-22 (×2): 2 [IU] via SUBCUTANEOUS
  Administered 2014-10-22: 7 [IU] via SUBCUTANEOUS
  Administered 2014-10-22: 2 [IU] via SUBCUTANEOUS
  Filled 2014-10-21: qty 1

## 2014-10-21 MED ORDER — STROKE: EARLY STAGES OF RECOVERY BOOK
Freq: Once | Status: AC
  Administered 2014-10-22: 05:00:00
  Filled 2014-10-21: qty 1

## 2014-10-21 MED ORDER — ASPIRIN 325 MG PO TABS
325.0000 mg | ORAL_TABLET | Freq: Every day | ORAL | Status: DC
  Administered 2014-10-23 – 2014-10-25 (×3): 325 mg via ORAL
  Filled 2014-10-21 (×3): qty 1

## 2014-10-21 MED ORDER — LABETALOL HCL 5 MG/ML IV SOLN
10.0000 mg | INTRAVENOUS | Status: DC | PRN
  Administered 2014-10-21: 20 mg via INTRAVENOUS
  Administered 2014-10-23: 10 mg via INTRAVENOUS
  Filled 2014-10-21 (×3): qty 4

## 2014-10-21 MED ORDER — HEPARIN SODIUM (PORCINE) 5000 UNIT/ML IJ SOLN
5000.0000 [IU] | Freq: Three times a day (TID) | INTRAMUSCULAR | Status: DC
  Administered 2014-10-21 – 2014-10-25 (×12): 5000 [IU] via SUBCUTANEOUS
  Filled 2014-10-21 (×12): qty 1

## 2014-10-21 MED ORDER — ASPIRIN 300 MG RE SUPP
300.0000 mg | Freq: Every day | RECTAL | Status: DC
Start: 2014-10-21 — End: 2014-10-25
  Administered 2014-10-21 – 2014-10-22 (×2): 300 mg via RECTAL
  Filled 2014-10-21 (×2): qty 1

## 2014-10-21 NOTE — ED Notes (Signed)
CBG: 327 

## 2014-10-21 NOTE — ED Provider Notes (Signed)
CSN: [id number]     Arrival date & time 10/21/14  1614 History   First MD Initiated Contact with Patient 10/21/14 1657     Chief Complaint  Patient presents with  . Cerebrovascular Accident     (Consider location/radiation/quality/duration/timing/severity/associated sxs/prior Treatment) HPI Comments: Patient presents to the ER for evaluation of altered mental status. Patient is brought to the emergency department by a neighbor. Neighbor reports that she became suspicious when the patient's car had not been removed for several days. Neighbor went and checked on her today and found that she could not speak. Patient cannot get any words out upon arrival to the ER, is nonverbal. Level V Caveat due to inability to speak.   Past Medical History  Diagnosis Date  . Diabetes mellitus   . Hypertension   . Retinopathy 06/06/2013    Per eye exam 05/31/13 eye exam, Moderate retinopathy    Past Surgical History  Procedure Laterality Date  . Appendectomy  1972  . Abdominal hysterectomy  1972   Family History  Problem Relation Age of Onset  . Arthritis Maternal Grandmother   . Heart disease Maternal Grandmother 80   History  Substance Use Topics  . Smoking status: Never Smoker   . Smokeless tobacco: Never Used  . Alcohol Use: No   OB History    No data available     Review of Systems  Unable to perform ROS: Patient nonverbal      Allergies  Codeine and Sulfa antibiotics  Home Medications   Prior to Admission medications   Medication Sig Start Date End Date Taking? Authorizing Provider  amLODipine (NORVASC) 10 MG tablet TAKE 1 TABLET (10 MG TOTAL) BY MOUTH DAILY. 08/15/13  Yes Brunetta Jeans, PA-C  aspirin EC 81 MG tablet Take 81 mg by mouth daily.    Historical Provider, MD  fish oil-omega-3 fatty acids 1000 MG capsule Take 2 g by mouth 2 (two) times daily.    Historical Provider, MD  losartan-hydrochlorothiazide (HYZAAR) 50-12.5 MG per tablet Take 1 tablet by mouth daily.  07/27/13  Yes Debbrah Alar, NP  metFORMIN (GLUCOPHAGE) 500 MG tablet Take 2 tablets (1,000 mg total) by mouth 2 (two) times daily with a meal. 05/08/13  Yes Debbrah Alar, NP  omeprazole (PRILOSEC OTC) 20 MG tablet Take 20 mg by mouth as needed.    Historical Provider, MD  potassium chloride SA (K-DUR,KLOR-CON) 20 MEQ tablet Take 1 tablet (20 mEq total) by mouth daily. 05/08/13  Yes Debbrah Alar, NP  sitaGLIPtin (JANUVIA) 100 MG tablet Take 1 tablet (100 mg total) by mouth daily. 05/08/13  Yes Debbrah Alar, NP   BP 205/86 mmHg  Pulse 88  Temp(Src) 99.2 F (37.3 C) (Oral)  Resp 15  SpO2 98%  LMP 08/18/1970 Physical Exam  Constitutional: She appears well-developed and well-nourished. No distress.  HENT:  Head: Normocephalic and atraumatic.  Right Ear: Hearing normal.  Left Ear: Hearing normal.  Nose: Nose normal.  Mouth/Throat: Oropharynx is clear and moist and mucous membranes are normal.  Eyes: Conjunctivae and EOM are normal. Pupils are equal, round, and reactive to light.  Neck: Normal range of motion. Neck supple.  Cardiovascular: Regular rhythm, S1 normal and S2 normal.  Exam reveals no gallop and no friction rub.   No murmur heard. Pulmonary/Chest: Effort normal and breath sounds normal. No respiratory distress. She exhibits no tenderness.  Abdominal: Soft. Normal appearance and bowel sounds are normal. There is no hepatosplenomegaly. There is no tenderness. There is no  rebound, no guarding, no tenderness at McBurney's point and negative Murphy's sign. No hernia.  Musculoskeletal: Normal range of motion.  Neurological: She is alert. She has normal strength. No cranial nerve deficit or sensory deficit. Coordination normal. GCS eye subscore is 4. GCS verbal subscore is 2. GCS motor subscore is 6.  Formal neurologic exam is difficult because patient has expressive and receptive aphasia, is having difficulty following commands. She is moving all 4 extremities without  difficulty.  Skin: Skin is warm, dry and intact. No rash noted. No cyanosis.  Psychiatric: She has a normal mood and affect. Her speech is normal and behavior is normal. Thought content normal.  Nursing note and vitals reviewed.   ED Course  Procedures (including critical care time) Labs Review Labs Reviewed  CBC WITH DIFFERENTIAL/PLATELET - Abnormal; Notable for the following:    WBC 12.4 (*)    Hemoglobin 15.6 (*)    Platelets 439 (*)    Monocytes Absolute 1.5 (*)    All other components within normal limits  COMPREHENSIVE METABOLIC PANEL - Abnormal; Notable for the following:    Potassium 2.8 (*)    Chloride 90 (*)    CO2 34 (*)    Glucose, Bld 364 (*)    GFR calc non Af Amer 86 (*)    All other components within normal limits  CBG MONITORING, ED - Abnormal; Notable for the following:    Glucose-Capillary 327 (*)    All other components within normal limits  APTT  PROTIME-INR  URINALYSIS, ROUTINE W REFLEX MICROSCOPIC    Imaging Review Ct Head Wo Contrast  10/21/2014   CLINICAL DATA:  Acute aphasia, confusion and altered mental status.  EXAM: CT HEAD WITHOUT CONTRAST  TECHNIQUE: Contiguous axial images were obtained from the base of the skull through the vertex without intravenous contrast.  COMPARISON:  03/03/2010 MRI and 03/02/2010 CT  FINDINGS: Moderate to severe chronic small-vessel white matter ischemic changes are again identified.  No acute intracranial abnormalities are identified, including mass lesion or mass effect, hydrocephalus, extra-axial fluid collection, midline shift, hemorrhage, or acute infarction.  The visualized bony calvarium is unremarkable.  IMPRESSION: No evidence of acute intracranial abnormality.  Moderate to severe chronic small-vessel white matter ischemic changes.   Electronically Signed   By: Margarette Canada M.D.   On: 10/21/2014 19:53     EKG Interpretation None        Date: 10/21/2014  Rate: 88  Rhythm: normal sinus rhythm  QRS Axis: normal   Intervals: normal  ST/T Wave abnormalities: nonspecific ST/T changes  Conduction Disutrbances:none  Narrative Interpretation:   Old EKG Reviewed: none available    MDM   Final diagnoses:  Cerebral infarction due to unspecified mechanism    Patient presents to the ER for evaluation of mental status changes. Examination reveals complete expressive aphasia in this patient. Timing of onset of symptoms is unknown, she has not been seen in nearly a week. She cannot provide any further information because she cannot speak. She can answer yes and no questions, but has difficulty following commands, likely has some receptive aphasia as well. No focal findings on examination. CT scan of head did not show any acute abnormality. Patient has been hypertensive here in the ER, she has improved somewhat to the 180s over 90s, will treat if she starts to increase again into the 710G systolic range as previous.    Orpah Greek, MD 10/21/14 2006

## 2014-10-21 NOTE — ED Notes (Signed)
CT called to check when pt will go to CT, states pt next for CT.

## 2014-10-21 NOTE — H&P (Signed)
[facility] History and Physical  ALIA PARSLEY HEN:[id number] DOB: [birth year] DOA: 10/21/2014  Referring physician: EDP PCP: Nance Pear., NP   Chief Complaint: Aphasia   HPI: April [name] is a 74 y.o. female h/o HTN, DM2, patient presents to ED for eval of AMS.  Patient is brought in by neighbor who noted patient was acting differently today.  Patient appeared to have difficulty walking, difficulty with speech and garbled speech.  Car hadnt been moved in 3 days neighbor notes.  Review of Systems: Systems reviewed.  As above, otherwise negative  Past Medical History  Diagnosis Date  . Diabetes mellitus   . Hypertension   . Retinopathy 06/06/2013    Per eye exam 05/31/13 eye exam, Moderate retinopathy    Past Surgical History  Procedure Laterality Date  . Appendectomy  1972  . Abdominal hysterectomy  1972   Social History:  reports that she has never smoked. She has never used smokeless tobacco. She reports that she does not drink alcohol or use illicit drugs.  Allergies  Allergen Reactions  . Codeine Nausea And Vomiting  . Sulfa Antibiotics Nausea And Vomiting    Family History  Problem Relation Age of Onset  . Arthritis Maternal Grandmother   . Heart disease Maternal Grandmother 80     Prior to Admission medications   Medication Sig Start Date End Date Taking? Authorizing Provider  amLODipine (NORVASC) 10 MG tablet TAKE 1 TABLET (10 MG TOTAL) BY MOUTH DAILY. 08/15/13  Yes Brunetta Jeans, PA-C  aspirin EC 81 MG tablet Take 81 mg by mouth daily.    Historical Provider, MD  fish oil-omega-3 fatty acids 1000 MG capsule Take 2 g by mouth 2 (two) times daily.    Historical Provider, MD  losartan-hydrochlorothiazide (HYZAAR) 50-12.5 MG per tablet Take 1 tablet by mouth daily. 07/27/13  Yes Debbrah Alar, NP  metFORMIN (GLUCOPHAGE) 500 MG tablet Take 2 tablets (1,000 mg total) by mouth 2 (two) times daily with a meal. 05/08/13  Yes Debbrah Alar, NP  omeprazole (PRILOSEC OTC) 20 MG tablet Take 20 mg by mouth as needed.    Historical Provider, MD  potassium chloride SA (K-DUR,KLOR-CON) 20 MEQ tablet Take 1 tablet (20 mEq total) by mouth daily. 05/08/13  Yes Debbrah Alar, NP  sitaGLIPtin (JANUVIA) 100 MG tablet Take 1 tablet (100 mg total) by mouth daily. 05/08/13  Yes Debbrah Alar, NP   Physical Exam: Filed Vitals:   10/21/14 2030  BP: 190/88  Pulse: 84  Temp:   Resp: 16    BP 190/88 mmHg  Pulse 84  Temp(Src) 99.2 F (37.3 C) (Oral)  Resp 16  SpO2 94%  LMP 08/18/1970  General Appearance:    Alert, oriented, no distress, appears stated age  Head:    Normocephalic, atraumatic  Eyes:    PERRL, EOMI, sclera non-icteric        Nose:   Nares without drainage or epistaxis. Mucosa, turbinates normal  Throat:   Moist mucous membranes. Oropharynx without erythema or exudate.  Neck:   Supple. No carotid bruits.  No thyromegaly.  No lymphadenopathy.   Back:     No CVA tenderness, no spinal tenderness  Lungs:     Clear to auscultation bilaterally, without wheezes, rhonchi or rales  Chest wall:    No tenderness to palpitation  Heart:    Regular rate and rhythm without murmurs, gallops, rubs  Abdomen:     Soft, non-tender, nondistended, normal bowel sounds, no organomegaly  Genitalia:  deferred  Rectal:    deferred  Extremities:   No clubbing, cyanosis or edema.  Pulses:   2+ and symmetric all extremities  Skin:   Skin color, texture, turgor normal, no rashes or lesions  Lymph nodes:   Cervical, supraclavicular, and axillary nodes normal  Neurologic:   Patient appears to have Expressive and receptive aphasia both.  Intermittently follows simple commands, not following multistep commands.    Labs on Admission:  Basic Metabolic Panel:  Recent Labs Lab 10/21/14 1625  NA 137  K 2.8*  CL 90*  CO2 34*  GLUCOSE 364*  BUN 14  CREATININE 0.64  CALCIUM 9.5   Liver Function Tests:  Recent Labs Lab  10/21/14 1625  AST 17  ALT 14  ALKPHOS 68  BILITOT 1.0  PROT 7.9  ALBUMIN 3.7   No results for input(s): LIPASE, AMYLASE in the last 168 hours. No results for input(s): AMMONIA in the last 168 hours. CBC:  Recent Labs Lab 10/21/14 1625  WBC 12.4*  NEUTROABS 7.2  HGB 15.6*  HCT 44.1  MCV 90.4  PLT 439*   Cardiac Enzymes: No results for input(s): CKTOTAL, CKMB, CKMBINDEX, TROPONINI in the last 168 hours.  BNP (last 3 results) No results for input(s): PROBNP in the last 8760 hours. CBG:  Recent Labs Lab 10/21/14 1654  GLUCAP 327*    Radiological Exams on Admission: Ct Head Wo Contrast  10/21/2014   CLINICAL DATA:  Acute aphasia, confusion and altered mental status.  EXAM: CT HEAD WITHOUT CONTRAST  TECHNIQUE: Contiguous axial images were obtained from the base of the skull through the vertex without intravenous contrast.  COMPARISON:  03/03/2010 MRI and 03/02/2010 CT  FINDINGS: Moderate to severe chronic small-vessel white matter ischemic changes are again identified.  No acute intracranial abnormalities are identified, including mass lesion or mass effect, hydrocephalus, extra-axial fluid collection, midline shift, hemorrhage, or acute infarction.  The visualized bony calvarium is unremarkable.  IMPRESSION: No evidence of acute intracranial abnormality.  Moderate to severe chronic small-vessel white matter ischemic changes.   Electronically Signed   By: Margarette Canada M.D.   On: 10/21/2014 19:53    EKG: Independently reviewed.  Assessment/Plan Principal Problem:   Aphasia Active Problems:   Uncontrolled diabetes mellitus type 2 without complications   HYPERTENSION, BENIGN SYSTEMIC   1. Aphasia - ischemic stroke vs PRES 1. MRI brain being done right now 2. Stroke pathway and workup ordered 3. ASA ordered 4. NPO as she failed swallow 5. PT/OT/SLP 6. Start IVF tomorrow AM if she remains NPO, but will hold off for the moment due to very high BP 7. See Dr. Hazle Quant  note 2. HTN - Labetalol PRN to treat BP > 180 is ordered, awaiting MRI, if not acute stroke then will treat more aggressively, if it is an acute stroke then will hold off on treating unless it gets into the 200s. 3. DM2 - low dose SSI Q4H    Code Status: Full Code  Family Communication: Neighbor at bedside Disposition Plan: Admit to inpatient   Time spent: 70 min  Erynn Vaca M. [facility] Pager [phone]  If 7AM-7PM, please contact the day team taking care of the patient [email or web address] Password [email or web address] 10/21/2014, 9:07 PM

## 2014-10-21 NOTE — ED Notes (Signed)
Pt presents to department for evaluation of possible CVA. Last seen normal unknown. Neighbor went to check on her today, noticed dogs were outside and car has been in parking spot x1 week, states patient is usually very active and this isn't like her. Reports she went to house this afternoon and noticed she was slurring words and unable to form sentences. Pt is alert, but confused upon arrival to ED, speech garbled and difficult to understand.

## 2014-10-21 NOTE — Consult Note (Addendum)
Stroke Consult    Chief Complaint: altered mental status HPI: April [name] is an 74 y.o. female history of HTN, DM presenting for evaluation of altered mental status. Patient brought in by neighbor who noted patient was acting differently today. Having trouble getting her words out, speech didn't always make sense and she appeared to be having difficulty walking. She notes that her car had not been moved in 3 days.   CT head imaging reviewed. Shows no acute process. Patient with leukocytosis of 12.4 in ED. BP elevated with SBP in the 180s adn 190s.   Date last known well: unclear Time last known well: unclear tPA Given: no, outside tPA window  Past Medical History  Diagnosis Date  . Diabetes mellitus   . Hypertension   . Retinopathy 06/06/2013    Per eye exam 05/31/13 eye exam, Moderate retinopathy     Past Surgical History  Procedure Laterality Date  . Appendectomy  1972  . Abdominal hysterectomy  1972    Family History  Problem Relation Age of Onset  . Arthritis Maternal Grandmother   . Heart disease Maternal Grandmother 80   Social History:  reports that she has never smoked. She has never used smokeless tobacco. She reports that she does not drink alcohol or use illicit drugs.  Allergies:  Allergies  Allergen Reactions  . Codeine Nausea And Vomiting  . Sulfa Antibiotics Nausea And Vomiting     (Not in a hospital admission)  ROS: Out of a complete 14 system review, the patient complains of only the following symptoms, and all other reviewed systems are negative. Unable to obtain   Physical Examination: Filed Vitals:   10/21/14 2000  BP: 205/86  Pulse: 88  Temp:   Resp: 15   Physical Exam  Constitutional: He appears well-developed and well-nourished.  Psych: Affect appropriate to situation Eyes: No scleral injection HENT: No OP obstrucion Head: Normocephalic.  Cardiovascular: Normal rate and regular rhythm.  Respiratory: Effort normal and breath  sounds normal.  GI: Soft. Bowel sounds are normal. No distension. There is no tenderness.  Skin: WDI  Neurologic Examination: Mental Status: Alert, makes eye contact, oriented x 0. Expressive > receptive aphasia. Unable to repeat.  Intermittently follows simple commands (close eyes). Not following multi-step commands Cranial Nerves: II: unable to visualize fundi, visual fields grossly normal, pupils equal, round, reactive to light  III,IV, VI: ptosis not present, extra-ocular motions intact bilaterally V,VII: face grossly symmetric, facial light touch sensation normal bilaterally VIII: hearing normal bilaterally IX,X: gag reflex present XI: unable to test XII: unable to test Motor: Unable to formally test due to mental status but appears to move all extremities symmetrically and against light resistance Tone and bulk:normal tone throughout; no atrophy noted Sensory: Pinprick and light touch intact throughout, bilaterally Deep Tendon Reflexes: 2+ and symmetric throughout Plantars: Right: downgoing   Left: downgoing Cerebellar: Unable to test Gait: unable to test  Laboratory Studies:   Basic Metabolic Panel:  Recent Labs Lab 10/21/14 1625  NA 137  K 2.8*  CL 90*  CO2 34*  GLUCOSE 364*  BUN 14  CREATININE 0.64  CALCIUM 9.5    Liver Function Tests:  Recent Labs Lab 10/21/14 1625  AST 17  ALT 14  ALKPHOS 68  BILITOT 1.0  PROT 7.9  ALBUMIN 3.7   No results for input(s): LIPASE, AMYLASE in the last 168 hours. No results for input(s): AMMONIA in the last 168 hours.  CBC:  Recent Labs Lab 10/21/14 1625  WBC 12.4*  NEUTROABS 7.2  HGB 15.6*  HCT 44.1  MCV 90.4  PLT 439*    Cardiac Enzymes: No results for input(s): CKTOTAL, CKMB, CKMBINDEX, TROPONINI in the last 168 hours.  BNP: Invalid input(s): POCBNP  CBG:  Recent Labs Lab 10/21/14 6378  HYIFOY 774*    Microbiology: No results found for this or any previous visit.  Coagulation  Studies:  Recent Labs  10/21/14 1625  LABPROT 13.6  INR 1.03    Urinalysis: No results for input(s): COLORURINE, LABSPEC, PHURINE, GLUCOSEU, HGBUR, BILIRUBINUR, KETONESUR, PROTEINUR, UROBILINOGEN, NITRITE, LEUKOCYTESUR in the last 168 hours.  Invalid input(s): APPERANCEUR  Lipid Panel:     Component Value Date/Time   CHOL 155 05/31/2013 1014   TRIG 335* 05/31/2013 1014   HDL 37* 05/31/2013 1014   CHOLHDL 4.2 05/31/2013 1014   VLDL 67* 05/31/2013 1014   LDLCALC 51 05/31/2013 1014    HgbA1C:  Lab Results  Component Value Date   HGBA1C 10.7* 05/06/2013    Urine Drug Screen:  No results found for: LABOPIA, COCAINSCRNUR, LABBENZ, AMPHETMU, THCU, LABBARB  Alcohol Level: No results for input(s): ETH in the last 168 hours.   Imaging: Ct Head Wo Contrast  10/21/2014   CLINICAL DATA:  Acute aphasia, confusion and altered mental status.  EXAM: CT HEAD WITHOUT CONTRAST  TECHNIQUE: Contiguous axial images were obtained from the base of the skull through the vertex without intravenous contrast.  COMPARISON:  03/03/2010 MRI and 03/02/2010 CT  FINDINGS: Moderate to severe chronic small-vessel white matter ischemic changes are again identified.  No acute intracranial abnormalities are identified, including mass lesion or mass effect, hydrocephalus, extra-axial fluid collection, midline shift, hemorrhage, or acute infarction.  The visualized bony calvarium is unremarkable.  IMPRESSION: No evidence of acute intracranial abnormality.  Moderate to severe chronic small-vessel white matter ischemic changes.   Electronically Signed   By: Margarette Canada M.D.   On: 10/21/2014 19:53    Assessment: 74 y.o. female hx of DM, HTN presenting with altered mental status. CT head unremarkable.  Suspect distal L MCA infarct. With elevated BP differential would also include hypertensive encephalopathy/PRES.   Stroke Risk Factors - HTN, DM  Plan: 1. HgbA1c, fasting lipid panel 2. MRI, MRA  of the brain without  contrast 3. PT consult, OT consult, Speech consult 4. Echocardiogram 5. Carotid dopplers 6. Prophylactic therapy-increase ASA to 325mg .  7. Risk factor modification 8. Telemetry monitoring 9. Frequent neuro checks 10. NPO until RN stroke swallow screen 11. If MRI brain negative for acute stroke would favor more aggressive treatment of elevated blood pressure 12. Check UA   Jim Like, DO [facility] [phone]  If 7pm- 7am, please page neurology on call as listed in AMION. 10/21/2014, 8:11 PM

## 2014-10-22 ENCOUNTER — Inpatient Hospital Stay (HOSPITAL_COMMUNITY): Payer: Medicare HMO

## 2014-10-22 ENCOUNTER — Encounter (HOSPITAL_COMMUNITY): Payer: Self-pay | Admitting: Neurology

## 2014-10-22 DIAGNOSIS — I639 Cerebral infarction, unspecified: Secondary | ICD-10-CM

## 2014-10-22 DIAGNOSIS — I6330 Cerebral infarction due to thrombosis of unspecified cerebral artery: Secondary | ICD-10-CM | POA: Insufficient documentation

## 2014-10-22 LAB — URINE MICROSCOPIC-ADD ON

## 2014-10-22 LAB — URINALYSIS, ROUTINE W REFLEX MICROSCOPIC
Hgb urine dipstick: NEGATIVE
KETONES UR: 40 mg/dL — AB
Leukocytes, UA: NEGATIVE
Nitrite: NEGATIVE
PH: 6 (ref 5.0–8.0)
Protein, ur: 30 mg/dL — AB
SPECIFIC GRAVITY, URINE: 1.026 (ref 1.005–1.030)
Urobilinogen, UA: 1 mg/dL (ref 0.0–1.0)

## 2014-10-22 LAB — COMPREHENSIVE METABOLIC PANEL
ALBUMIN: 3.4 g/dL — AB (ref 3.5–5.2)
ALT: 13 U/L (ref 0–35)
AST: 18 U/L (ref 0–37)
Alkaline Phosphatase: 67 U/L (ref 39–117)
Anion gap: 9 (ref 5–15)
BILIRUBIN TOTAL: 0.9 mg/dL (ref 0.3–1.2)
BUN: 14 mg/dL (ref 6–23)
CHLORIDE: 94 mmol/L — AB (ref 96–112)
CO2: 36 mmol/L — AB (ref 19–32)
Calcium: 8.9 mg/dL (ref 8.4–10.5)
Creatinine, Ser: 0.58 mg/dL (ref 0.50–1.10)
GFR, EST NON AFRICAN AMERICAN: 89 mL/min — AB (ref >90)
Glucose, Bld: 177 mg/dL — ABNORMAL HIGH (ref 70–99)
Potassium: 2.6 mmol/L — CL (ref 3.5–5.1)
SODIUM: 139 mmol/L (ref 135–145)
TOTAL PROTEIN: 7.2 g/dL (ref 6.0–8.3)

## 2014-10-22 LAB — RAPID URINE DRUG SCREEN, HOSP PERFORMED
Amphetamines: NOT DETECTED
BARBITURATES: NOT DETECTED
Benzodiazepines: NOT DETECTED
COCAINE: NOT DETECTED
Opiates: NOT DETECTED
TETRAHYDROCANNABINOL: NOT DETECTED

## 2014-10-22 LAB — TSH: TSH: 1.533 u[IU]/mL (ref 0.350–4.500)

## 2014-10-22 LAB — GLUCOSE, CAPILLARY
GLUCOSE-CAPILLARY: 176 mg/dL — AB (ref 70–99)
GLUCOSE-CAPILLARY: 173 mg/dL — AB (ref 70–99)
Glucose-Capillary: 164 mg/dL — ABNORMAL HIGH (ref 70–99)
Glucose-Capillary: 342 mg/dL — ABNORMAL HIGH (ref 70–99)
Glucose-Capillary: 203 mg/dL — ABNORMAL HIGH (ref 70–99)

## 2014-10-22 LAB — LIPID PANEL
CHOL/HDL RATIO: 6 ratio
Cholesterol: 169 mg/dL (ref 0–200)
HDL: 28 mg/dL — AB (ref >39)
LDL Cholesterol: 105 mg/dL — ABNORMAL HIGH (ref 0–99)
Triglycerides: 178 mg/dL — ABNORMAL HIGH (ref <150)
VLDL: 36 mg/dL (ref 0–40)

## 2014-10-22 LAB — TROPONIN I
Troponin I: <0.03 ng/mL (ref <0.031)
Troponin I: <0.03 ng/mL (ref <0.031)

## 2014-10-22 LAB — LACTIC ACID, PLASMA: LACTIC ACID, VENOUS: 0.9 mmol/L (ref 0.5–2.0)

## 2014-10-22 LAB — MAGNESIUM: Magnesium: 1.9 mg/dL (ref 1.5–2.5)

## 2014-10-22 MED ORDER — SODIUM CHLORIDE 0.9 % IV SOLN
INTRAVENOUS | Status: DC
  Administered 2014-10-22 – 2014-10-23 (×3): via INTRAVENOUS
  Filled 2014-10-22 (×7): qty 1000

## 2014-10-22 MED ORDER — ATORVASTATIN CALCIUM 10 MG PO TABS
20.0000 mg | ORAL_TABLET | Freq: Every day | ORAL | Status: DC
  Administered 2014-10-23 – 2014-10-25 (×3): 20 mg via ORAL
  Filled 2014-10-22 (×4): qty 2

## 2014-10-22 MED ORDER — PNEUMOCOCCAL VAC POLYVALENT 25 MCG/0.5ML IJ INJ
0.5000 mL | INJECTION | INTRAMUSCULAR | Status: AC
  Administered 2014-10-25: 0.5 mL via INTRAMUSCULAR
  Filled 2014-10-22: qty 0.5

## 2014-10-22 MED ORDER — SODIUM CHLORIDE 0.9 % IV SOLN
INTRAVENOUS | Status: DC
  Administered 2014-10-22: 11:00:00 via INTRAVENOUS

## 2014-10-22 MED ORDER — INSULIN ASPART 100 UNIT/ML ~~LOC~~ SOLN: 0.0000 [IU] | Freq: Every day | SUBCUTANEOUS | Status: DC

## 2014-10-22 MED ORDER — INSULIN ASPART 100 UNIT/ML ~~LOC~~ SOLN
0.0000 [IU] | Freq: Three times a day (TID) | SUBCUTANEOUS | Status: DC
  Administered 2014-10-23: 3 [IU] via SUBCUTANEOUS
  Administered 2014-10-23: 5 [IU] via SUBCUTANEOUS
  Administered 2014-10-23: 7 [IU] via SUBCUTANEOUS
  Administered 2014-10-24 (×2): 3 [IU] via SUBCUTANEOUS
  Administered 2014-10-25 (×2): 2 [IU] via SUBCUTANEOUS
  Administered 2014-10-25: 3 [IU] via SUBCUTANEOUS

## 2014-10-22 MED ORDER — INFLUENZA VAC SPLIT QUAD 0.5 ML IM SUSY
0.5000 mL | PREFILLED_SYRINGE | INTRAMUSCULAR | Status: DC
Start: 2014-10-23 — End: 2014-10-25
  Filled 2014-10-22: qty 0.5

## 2014-10-22 MED ORDER — INSULIN ASPART 100 UNIT/ML ~~LOC~~ SOLN
0.0000 [IU] | Freq: Every day | SUBCUTANEOUS | Status: DC
  Administered 2014-10-22: 2 [IU] via SUBCUTANEOUS
  Administered 2014-10-23: 3 [IU] via SUBCUTANEOUS

## 2014-10-22 MED ORDER — INSULIN ASPART 100 UNIT/ML ~~LOC~~ SOLN: 0.0000 [IU] | Freq: Three times a day (TID) | SUBCUTANEOUS | Status: DC

## 2014-10-22 NOTE — Progress Notes (Signed)
[facility] PROGRESS NOTE  April [name] RFF:[id number] DOB: Nov 25, 1940 DOA: 10/21/2014 PCP: April [name]., NP  Assessment/Plan: Principal Problem:   Aphasia Active Problems:   Uncontrolled diabetes mellitus type 2 without complications   HYPERTENSION, BENIGN SYSTEMIC   Cerebral thrombosis with cerebral infarction    Acute CVA MRI/MRA of the brain shows 5 mm focus of restricted diffusion within the inferior right thalamus as above, highly suspicious for a small acute ischemic infarct. ,Remote lacunar infarcts within the right thalamus and pons. Carotid Doppler 1-39% ICA stenosis. Vertebral artery flow is antegrade.  EEG pending PT/OT/speech pending 2-D echo pending Discussed with neurology Dr.Sethi, encephalopathy and physical exam suggestive of other acute processes Rule out infectious etiology Cycle cardiac enzymes LDL 105, HDL 28, triglycerides 178   Toxic encephalopathy? Related to dehydration Replete potassium Check UA, chest x-ray, blood culture, lactic acid Vitamin B-12, TSH, repeat CMP today  Diabetes mellitus Hemoglobin A1c pending, continue SSI No home medications listed    Code Status: full Family Communication: family updated about patient's clinical progress Disposition Plan:  As above    Brief narrative: April [name] is an 74 y.o. female history of HTN, DM presenting for evaluation of altered mental status. Patient brought in by neighbor who noted patient was acting differently today. Having trouble getting her words out, speech didn't always make sense and she appeared to be having difficulty walking. She notes that her car had not been moved in 3 days.   Consultants:  Neurology  Procedures:  None  Antibiotics: None  HPI/Subjective: Confused, disoriented, awake, dysarthria  Objective: Filed Vitals:   10/22/14 0400 10/22/14 0600 10/22/14 0800 10/22/14 1018  BP: 137/62 140/61 119/58 136/67  Pulse: 64 81 84 81  Temp:  99.5 F (37.5 C) 99.5 F (37.5 C) 99.5 F (37.5 C) 99.1 F (37.3 C)  TempSrc: Oral Oral Oral Oral  Resp: 16 17 20 20   Height:      Weight:      SpO2: 95% 97% 96% 94%   No intake or output data in the 24 hours ending 10/22/14 1154  Exam:  General: Patient confused and unable to speak in full sentences Lungs: Clear to auscultation bilaterally without wheezes or crackles Cardiovascular: Regular rate and rhythm without murmur gallop or rub normal S1 and S2 Abdomen: Nontender, nondistended, soft, bowel sounds positive, no rebound, no ascites, no appreciable mass Extremities: No significant cyanosis, clubbing, or edema bilateral lower extremities      Data Reviewed: Basic Metabolic Panel:  Recent Labs Lab 10/21/14 1625  NA 137  K 2.8*  CL 90*  CO2 34*  GLUCOSE 364*  BUN 14  CREATININE 0.64  CALCIUM 9.5    Liver Function Tests:  Recent Labs Lab 10/21/14 1625  AST 17  ALT 14  ALKPHOS 68  BILITOT 1.0  PROT 7.9  ALBUMIN 3.7   No results for input(s): LIPASE, AMYLASE in the last 168 hours. No results for input(s): AMMONIA in the last 168 hours.  CBC:  Recent Labs Lab 10/21/14 1625  WBC 12.4*  NEUTROABS 7.2  HGB 15.6*  HCT 44.1  MCV 90.4  PLT 439*    Cardiac Enzymes: No results for input(s): CKTOTAL, CKMB, CKMBINDEX, TROPONINI in the last 168 hours. BNP (last 3 results) No results for input(s): BNP in the last 8760 hours.  ProBNP (last 3 results) No results for input(s): PROBNP in the last 8760 hours.    CBG:  Recent Labs Lab 10/21/14 1654 10/22/14 0424 10/22/14 3570  GLUCAP 327* 176* 164*    No results found for this or any previous visit (from the past 240 hour(s)).   Studies: Ct Head Wo Contrast  10/21/2014   CLINICAL DATA:  Acute aphasia, confusion and altered mental status.  EXAM: CT HEAD WITHOUT CONTRAST  TECHNIQUE: Contiguous axial images were obtained from the base of the skull through the vertex without intravenous contrast.   COMPARISON:  03/03/2010 MRI and 03/02/2010 CT  FINDINGS: Moderate to severe chronic small-vessel white matter ischemic changes are again identified.  No acute intracranial abnormalities are identified, including mass lesion or mass effect, hydrocephalus, extra-axial fluid collection, midline shift, hemorrhage, or acute infarction.  The visualized bony calvarium is unremarkable.  IMPRESSION: No evidence of acute intracranial abnormality.  Moderate to severe chronic small-vessel white matter ischemic changes.   Electronically Signed   By: Margarette Canada M.D.   On: 10/21/2014 19:53   Mr Brain Wo Contrast  10/21/2014   CLINICAL DATA:  Initial evaluation for acute altered mental status. History of hypertension, diabetes.  EXAM: MRI HEAD WITHOUT CONTRAST  MRA HEAD WITHOUT CONTRAST  TECHNIQUE: Multiplanar, multiecho pulse sequences of the brain and surrounding structures were obtained without intravenous contrast. Angiographic images of the head were obtained using MRA technique without contrast.  COMPARISON:  Prior head CT from earlier the same day.  FINDINGS: MRI HEAD FINDINGS  Diffuse prominence of the CSF containing spaces is compatible with generalized cerebral atrophy. Patchy and confluent T2/FLAIR hyperintensity within the periventricular and deep white matter both cerebral hemispheres noted, most compatible with moderate chronic small vessel ischemic disease.  No mass lesion or midline shift. Ventricular prominence roller global parenchymal volume loss present without hydrocephalus. No extra-axial fluid collection.  No findings to suggest posterior reversible encephalopathy syndrome.  Small remote lacunar infarct present within the right pons. Additional small remote lacunar infarcts within the right thalamus.  There is a small 5 mm focus of high signal intensity on DWI signal in the inferolateral right thalamus, extending towards the right middle cerebral peduncle, suspicious for a small acute ischemic infarct  (series 4, image 20). No findings on T2 weighted sequence or ADC map to suggest that this is T2 shine through. No associated hemorrhage or significant mass effect. No other acute ischemic infarct.  Craniocervical junction within normal limits. Pituitary gland normal. No acute abnormality seen about the orbits. Paranasal sinuses clear. Mastoid no mastoid effusion.  Bone marrow signal intensity normal. Mild degenerative changes noted within the upper cervical spine. Scalp soft tissues within normal limits.  MRA HEAD FINDINGS  ANTERIOR CIRCULATION:  Visualized distal cervical segments of the internal carotid arteries are widely patent with antegrade flow. The petrous segments are widely patent. There is mild multi focal atherosclerotic irregularity within the cavernous carotid arteries bilaterally without flow-limiting stenosis. These changes are slightly worse on the left. Supraclinoid segments widely patent. A1 segments, anterior communicating artery common anterior cerebral artery is well opacified.  M1 segments widely patent without occlusion or stenosis. MCA bifurcation is normal. Atheromatous irregularity present within the distal MCA branches bilaterally.  POSTERIOR CIRCULATION:  Vertebral arteries somewhat diminutive but patent bilaterally to the vertebrobasilar junction. Posterior inferior cerebellar arteries are patent proximal Ing, not well evaluated distally. Mild multi focal atherosclerotic irregularity present throughout the basilar artery with superimposed mild stenosis within the mid basilar artery (series 505, image 12). Basilar artery itself is diminutive. Anterior inferior cerebral arteries are patent proximally. Superior cerebellar arteries patent. There is fetal origin of the posterior cerebral arteries  bilaterally with widely patent posterior communicating arteries. Mild distal branch irregularity present within the PCA arteries bilaterally. Distal right PCA not well visualized, and may be  partially occluded.  No aneurysm or vascular malformation.  IMPRESSION: MRI HEAD IMPRESSION:  1. 5 mm focus of restricted diffusion within the inferior right thalamus as above, highly suspicious for a small acute ischemic infarct. No associated hemorrhage or mass effect. 2. No other acute intracranial process. 3. Remote lacunar infarcts within the right thalamus and pons. 4. Generalized cerebral atrophy with moderate chronic microvascular ischemic disease.  MRA HEAD IMPRESSION:  1. No proximal branch occlusion or hemodynamically significant stenosis within the intracranial circulation. 2. Fetal origin of the PCAs bilaterally with diminutive vertebrobasilar system. There is mild atherosclerotic stenosis within the mid basilar artery. 3. Distal branch atheromatous irregularity within the MCA and PCA branches bilaterally.   Electronically Signed   By: Jeannine Boga M.D.   On: 10/21/2014 22:48   Mr Jodene Nam Head/brain Wo Cm  10/21/2014   CLINICAL DATA:  Initial evaluation for acute altered mental status. History of hypertension, diabetes.  EXAM: MRI HEAD WITHOUT CONTRAST  MRA HEAD WITHOUT CONTRAST  TECHNIQUE: Multiplanar, multiecho pulse sequences of the brain and surrounding structures were obtained without intravenous contrast. Angiographic images of the head were obtained using MRA technique without contrast.  COMPARISON:  Prior head CT from earlier the same day.  FINDINGS: MRI HEAD FINDINGS  Diffuse prominence of the CSF containing spaces is compatible with generalized cerebral atrophy. Patchy and confluent T2/FLAIR hyperintensity within the periventricular and deep white matter both cerebral hemispheres noted, most compatible with moderate chronic small vessel ischemic disease.  No mass lesion or midline shift. Ventricular prominence roller global parenchymal volume loss present without hydrocephalus. No extra-axial fluid collection.  No findings to suggest posterior reversible encephalopathy syndrome.  Small  remote lacunar infarct present within the right pons. Additional small remote lacunar infarcts within the right thalamus.  There is a small 5 mm focus of high signal intensity on DWI signal in the inferolateral right thalamus, extending towards the right middle cerebral peduncle, suspicious for a small acute ischemic infarct (series 4, image 20). No findings on T2 weighted sequence or ADC map to suggest that this is T2 shine through. No associated hemorrhage or significant mass effect. No other acute ischemic infarct.  Craniocervical junction within normal limits. Pituitary gland normal. No acute abnormality seen about the orbits. Paranasal sinuses clear. Mastoid no mastoid effusion.  Bone marrow signal intensity normal. Mild degenerative changes noted within the upper cervical spine. Scalp soft tissues within normal limits.  MRA HEAD FINDINGS  ANTERIOR CIRCULATION:  Visualized distal cervical segments of the internal carotid arteries are widely patent with antegrade flow. The petrous segments are widely patent. There is mild multi focal atherosclerotic irregularity within the cavernous carotid arteries bilaterally without flow-limiting stenosis. These changes are slightly worse on the left. Supraclinoid segments widely patent. A1 segments, anterior communicating artery common anterior cerebral artery is well opacified.  M1 segments widely patent without occlusion or stenosis. MCA bifurcation is normal. Atheromatous irregularity present within the distal MCA branches bilaterally.  POSTERIOR CIRCULATION:  Vertebral arteries somewhat diminutive but patent bilaterally to the vertebrobasilar junction. Posterior inferior cerebellar arteries are patent proximal Ing, not well evaluated distally. Mild multi focal atherosclerotic irregularity present throughout the basilar artery with superimposed mild stenosis within the mid basilar artery (series 505, image 12). Basilar artery itself is diminutive. Anterior inferior  cerebral arteries are patent proximally. Superior cerebellar arteries  patent. There is fetal origin of the posterior cerebral arteries bilaterally with widely patent posterior communicating arteries. Mild distal branch irregularity present within the PCA arteries bilaterally. Distal right PCA not well visualized, and may be partially occluded.  No aneurysm or vascular malformation.  IMPRESSION: MRI HEAD IMPRESSION:  1. 5 mm focus of restricted diffusion within the inferior right thalamus as above, highly suspicious for a small acute ischemic infarct. No associated hemorrhage or mass effect. 2. No other acute intracranial process. 3. Remote lacunar infarcts within the right thalamus and pons. 4. Generalized cerebral atrophy with moderate chronic microvascular ischemic disease.  MRA HEAD IMPRESSION:  1. No proximal branch occlusion or hemodynamically significant stenosis within the intracranial circulation. 2. Fetal origin of the PCAs bilaterally with diminutive vertebrobasilar system. There is mild atherosclerotic stenosis within the mid basilar artery. 3. Distal branch atheromatous irregularity within the MCA and PCA branches bilaterally.   Electronically Signed   By: Jeannine Boga M.D.   On: 10/21/2014 22:48    Scheduled Meds: . aspirin  300 mg Rectal Daily   Or  . aspirin  325 mg Oral Daily  . heparin  5,000 Units Subcutaneous 3 times per day  . [START ON 10/23/2014] Influenza vac split quadrivalent PF  0.5 mL Intramuscular Tomorrow-1000  . insulin aspart  0-9 Units Subcutaneous 6 times per day  . [START ON 10/23/2014] pneumococcal 23 valent vaccine  0.5 mL Intramuscular Tomorrow-1000   Continuous Infusions: . 0.9 % sodium chloride with kcl      Principal Problem:   Aphasia Active Problems:   Uncontrolled diabetes mellitus type 2 without complications   HYPERTENSION, BENIGN SYSTEMIC   Cerebral thrombosis with cerebral infarction    Time spent: 40 minutes   [facility] Pager [phone]. If 7PM-7AM, please contact night-coverage at [email or web address], password [facility] 10/22/2014, 11:54 AM  LOS: 1 day

## 2014-10-22 NOTE — Progress Notes (Signed)
Arrived on unit from ED via stretcher.  No valuables at bedside, sent home with Mariann Laster [phone].  Neighbor will contact family tomorrow, unable to reach today.  Patient cooperative, tele applied, skin intact, expressive aphasia.  NPO, assured order for bedside eval per ST.  MRI completed prior to admit to 4N10.

## 2014-10-22 NOTE — Progress Notes (Signed)
April [name] called Critical K+ at 2.6. Patient already on NS c 60K @ 75. MD notified, no new orders at this time.

## 2014-10-22 NOTE — Evaluation (Signed)
Physical Therapy Evaluation Patient Details Name: April [name] MRN: [id number] DOB: [birth year] Today's Date: 10/22/2014   History of Present Illness  April [name] is an 74 y.o. female history of HTN, DM presenting for evaluation of altered mental status. Patient brought in by neighbor who noted patient was acting differently today. Having trouble getting her words out, speech didn't always make sense and she appeared to be having difficulty walking.  Clinical Impression  Pt admitted with above diagnosis. Pt currently with functional limitations due to the deficits listed below (see PT Problem List).  Pt will benefit from skilled PT to increase their independence and safety with mobility to allow discharge to the venue listed below.       Follow Up Recommendations CIR    Equipment Recommendations  Other (comment) (TBD)    Recommendations for Other Services       Precautions / Restrictions Precautions Precautions: Fall      Mobility  Bed Mobility Overal bed mobility: Needs Assistance Bed Mobility: Supine to Sit     Supine to sit: Min assist     General bed mobility comments: assist for movement initiation and sequencing due to receptive language deficits  Transfers Overall transfer level: Needs assistance Equipment used: None Transfers: Sit to/from [facility] Sit to Stand: Min assist Stand pivot transfers: Min assist       General transfer comment: assist for sequencing, technique and safety due to receptive language deficits  Ambulation/Gait Ambulation/Gait assistance: Min assist Ambulation Distance (Feet): 10 Feet Assistive device: None Gait Pattern/deviations: Step-through pattern;Ataxic Gait velocity: mildly decreased   General Gait Details: Distance limited by IV needing to remain plugged in (dead battery).  Stairs            Wheelchair Mobility    Modified Rankin (Stroke Patients Only) Modified Rankin (Stroke Patients  Only) Pre-Morbid Rankin Score: No symptoms Modified Rankin: Moderately severe disability     Balance Overall balance assessment: Needs assistance Sitting-balance support: No upper extremity supported;Feet unsupported Sitting balance-Leahy Scale: Good     Standing balance support: No upper extremity supported;During functional activity Standing balance-Leahy Scale: Fair                               Pertinent Vitals/Pain Pain Assessment: No/denies pain    Home Living Family/patient expects to be discharged to:: Assisted living Living Arrangements: Alone             Home Equipment: None      Prior Function Level of Independence: Independent               Hand Dominance        Extremity/Trunk Assessment   Upper Extremity Assessment: Defer to OT evaluation           Lower Extremity Assessment: Overall WFL for tasks assessed      Cervical / Trunk Assessment: Normal  Communication   Communication: Expressive difficulties;Receptive difficulties  Cognition Arousal/Alertness: Awake/alert Behavior During Therapy: Flat affect Overall Cognitive Status: Difficult to assess                      General Comments      Exercises        Assessment/Plan    PT Assessment Patient needs continued PT services  PT Diagnosis Abnormality of gait;Altered mental status   PT Problem List Decreased activity tolerance;Decreased balance;Decreased mobility;Decreased knowledge of precautions;Decreased safety awareness;Decreased knowledge  of use of DME;Decreased cognition  PT Treatment Interventions DME instruction;Gait training;Stair training;Functional mobility training;Therapeutic activities;Therapeutic exercise;Patient/family education;Cognitive remediation;Balance training;Neuromuscular re-education   PT Goals (Current goals can be found in the Care Plan section) Acute Rehab PT Goals Patient Stated Goal: unable to state PT Goal Formulation:  Patient unable to participate in goal setting Time For Goal Achievement: 11/05/14 Potential to Achieve Goals: Good    Frequency Min 4X/week   Barriers to discharge Decreased caregiver support      Co-evaluation               End of Session Equipment Utilized During Treatment: Gait belt Activity Tolerance: Patient tolerated treatment well Patient left: in chair;with call bell/phone within reach;with nursing/sitter in room Nurse Communication: Mobility status         Time: 7371-0626 PT Time Calculation (min) (ACUTE ONLY): 16 min   Charges:   PT Evaluation $Initial PT Evaluation Tier I: 1 Procedure     PT G CodesLorriane [name] 10/22/2014, 12:56 PM

## 2014-10-22 NOTE — Evaluation (Signed)
Clinical/Bedside Swallow Evaluation Patient Details  Name: RUDINE RIEGER MRN: [id number] Date of Birth: 13-Feb-1941  Today's Date: 10/22/2014 Time: SLP Start Time (ACUTE ONLY): 1400 SLP Stop Time (ACUTE ONLY): 1420 SLP Time Calculation (min) (ACUTE ONLY): 20 min  Past Medical History:  Past Medical History  Diagnosis Date  . Diabetes mellitus   . Hypertension   . Retinopathy 06/06/2013    Per eye exam 05/31/13 eye exam, Moderate retinopathy    Past Surgical History:  Past Surgical History  Procedure Laterality Date  . Appendectomy  1972  . Abdominal hysterectomy  1972   HPI:  74 y.o. female hx of DM, HTN presenting with altered mental status. MRI shows 5 mm focus of restricted diffusion within the inferior right thalamus as above, highly suspicious for a small acute ischemic infarct. Family reports history of pt choking on meats and sometimes having to cough them up. They do not know if she has seen an MD regarding this issue.   Assessment / Plan / Recommendation Clinical Impression  Pt demonstrates normal ability to masticate and swallow without evidence of aspiration. She does need assist for feeding due to visual deficits and discoordination of the right hand. Will initiate dys 3 mechanical soft diet and thin liquids with full supervision for assist with self feeding, given history of possible esophageal dysphagia. Will f/u tomorrow for cognitive linguistic assessment    Aspiration Risk  Mild    Diet Recommendation Regular;Thin liquid   Liquid Administration via: Cup;Straw Medication Administration: Whole meds with liquid Supervision: Patient able to self feed;Full supervision/cueing for compensatory strategies Compensations: Slow rate;Small sips/bites Postural Changes and/or Swallow Maneuvers: Seated upright 90 degrees    Other  Recommendations Oral Care Recommendations: Oral care BID   Follow Up Recommendations  Inpatient Rehab    Frequency and Duration min 2x/week   2 weeks   Pertinent Vitals/Pain NA    SLP Swallow Goals     Swallow Study Prior Functional Status       General HPI: 74 y.o. female hx of DM, HTN presenting with altered mental status. MRI shows 5 mm focus of restricted diffusion within the inferior right thalamus as above, highly suspicious for a small acute ischemic infarct. Type of Study: Bedside swallow evaluation Diet Prior to this Study: Dysphagia 1 (puree) Temperature Spikes Noted: No Respiratory Status: Room air History of Recent Intubation: No Behavior/Cognition: Alert;Requires cueing;Doesn't follow directions Oral Cavity - Dentition: Edentulous;Dentures, not available Self-Feeding Abilities: Able to feed self;Needs assist Patient Positioning: Upright in bed Baseline Vocal Quality: Clear Volitional Cough: Strong Volitional Swallow: Able to elicit    Oral/Motor/Sensory Function Overall Oral Motor/Sensory Function: Appears within functional limits for tasks assessed (cannot follow commands even with visual cues)   Ice Chips     Thin Liquid Thin Liquid: Within functional limits Presentation: Self Fed;Straw;Cup    Nectar Thick Nectar Thick Liquid: Not tested   Honey Thick Honey Thick Liquid: Not tested   Puree Puree: Within functional limits   Solid   GO    Solid: Within functional limits      Herbie Baltimore, MA CCC-SLP [phone]  Akeylah Hendel, Katherene Ponto 10/22/2014,2:37 PM

## 2014-10-22 NOTE — Progress Notes (Signed)
VASCULAR LAB PRELIMINARY  PRELIMINARY  PRELIMINARY  PRELIMINARY  Carotid Dopplers completed.    Preliminary report:  1-39% ICA stenosis.  Vertebral artery flow is antegrade.   Nevia Henkin, RVT 10/22/2014, 11:16 AM

## 2014-10-22 NOTE — Progress Notes (Signed)
Patient is unable to void, bladder scan is 350cc. MD notified and order recd to place [facility].

## 2014-10-22 NOTE — Progress Notes (Signed)
STROKE TEAM PROGRESS NOTE   HISTORY April [name] is a 74 y.o. female history of HTN, DM presenting for evaluation of altered mental status. Patient brought in by neighbor who noted patient was acting differently today. Having trouble getting her words out, speech didn't always make sense and she appeared to be having difficulty walking. She noted that her car had not been moved in 3 days.   CT head imaging reviewed. Shows no acute process. Patient with leukocytosis of 12.4 in ED. BP elevated with SBP in the 180s adn 190s.   Date last known well: unclear Time last known well: unclear tPA Given: no, outside tPA window   SUBJECTIVE (INTERVAL HISTORY) 2 family members are present. They report that the patient is still confused. She is usually very mentally sharp.   OBJECTIVE Temp:  [98 F (36.7 C)-99.7 F (37.6 C)] 99.1 F (37.3 C) (03/06 1018) Pulse Rate:  [63-91] 81 (03/06 1018) Cardiac Rhythm:  [-]  Resp:  [13-23] 20 (03/06 1018) BP: (109-211)/(50-100) 136/67 mmHg (03/06 1018) SpO2:  [94 %-98 %] 94 % (03/06 1018) Weight:  [48.217 kg (106 lb 4.8 oz)] 48.217 kg (106 lb 4.8 oz) (03/05 2218)   Recent Labs Lab 10/21/14 1654 10/22/14 0424 10/22/14 0810 10/22/14 1148  GLUCAP 327* 176* 164* 173*    Recent Labs Lab 10/21/14 1625  NA 137  K 2.8*  CL 90*  CO2 34*  GLUCOSE 364*  BUN 14  CREATININE 0.64  CALCIUM 9.5    Recent Labs Lab 10/21/14 1625  AST 17  ALT 14  ALKPHOS 68  BILITOT 1.0  PROT 7.9  ALBUMIN 3.7    Recent Labs Lab 10/21/14 1625  WBC 12.4*  NEUTROABS 7.2  HGB 15.6*  HCT 44.1  MCV 90.4  PLT 439*   No results for input(s): CKTOTAL, CKMB, CKMBINDEX, TROPONINI in the last 168 hours.  Recent Labs  10/21/14 1625  LABPROT 13.6  INR 1.03   No results for input(s): COLORURINE, LABSPEC, PHURINE, GLUCOSEU, HGBUR, BILIRUBINUR, KETONESUR, PROTEINUR, UROBILINOGEN, NITRITE, LEUKOCYTESUR in the last 72 hours.  Invalid input(s): APPERANCEUR      Component Value Date/Time   CHOL 169 10/22/2014 0549   TRIG 178* 10/22/2014 0549   HDL 28* 10/22/2014 0549   CHOLHDL 6.0 10/22/2014 0549   VLDL 36 10/22/2014 0549   LDLCALC 105* 10/22/2014 0549   Lab Results  Component Value Date   HGBA1C 10.7* 05/06/2013   No results found for: LABOPIA, COCAINSCRNUR, LABBENZ, AMPHETMU, THCU, LABBARB  No results for input(s): ETH in the last 168 hours.  Ct Head Wo Contrast 10/21/2014    No evidence of acute intracranial abnormality.  Moderate to severe chronic small-vessel white matter ischemic changes.      Mr Brain Wo Contrast 10/21/2014     MRI HEAD    1. 5 mm focus of restricted diffusion within the inferior right thalamus as above, highly suspicious for a small acute ischemic infarct. No associated hemorrhage or mass effect.  2. No other acute intracranial process.  3. Remote lacunar infarcts within the right thalamus and pons.  4. Generalized cerebral atrophy with moderate chronic microvascular ischemic disease.    MRA HEAD   1. No proximal branch occlusion or hemodynamically significant stenosis within the intracranial circulation.  2. Fetal origin of the PCAs bilaterally with diminutive vertebrobasilar system. There is mild atherosclerotic stenosis within the mid basilar artery.  3. Distal branch atheromatous irregularity within the MCA and PCA branches bilaterally.  PHYSICAL EXAM Frail elderly Caucasian lady not in distress. . Afebrile. Head is nontraumatic. Neck is supple without bruit.    Cardiac exam no murmur or gallop. Lungs are clear to auscultation. Distal pulses are well felt. Neurological Exam : Awake alert disoriented speech is nonfluent but clear. Follows only simple midline and one-step commands. Diminished attention, registration and recall. Blinks to threat bilaterally. Fundi could not be visualized. Face is symmetric without weakness. Tongue is midline. Motor system exam reveals no focal weakness and is also extremity  well against gravity. Deep tendon pulses are symmetric. Plantars are downgoing. Gait was not tested. ASSESSMENT/PLAN [name] is a 74 y.o. female with a history of diabetes mellitus and hypertension presenting with altered mental status, speech difficulties, and gait disturbance. She did not receive IV t-PA due to late presentation.  Stroke:  Non-dominant  infarct of the  inferior right thalamus likely secondary to small vessel disease.   Resultant  Altered mental status,confusion, delirium  MRI  As above  MRA  As above  Carotid Doppler  1-39% ICA stenosis. Vertebral artery flow is antegrade.  2D Echo  pending  LDL 105  HgbA1c pending  Subcutaneous heparin for VTE prophylaxis  Diet regular with thin liquids  no antithrombotic prior to admission, now on aspirin 325 mg orally every day  Ongoing aggressive stroke risk factor management  Therapy recommendations: CIR  Disposition:  pending  Hypertension  Home meds:   none  Stable  Hyperlipidemia  Home meds:  none   LDL 105, goal < 70  Now on Lipitor 20 mg daily  Continue statin at discharge  Diabetes  HgbA1c pending, goal < 7.0  Uncontrolled  Other Stroke Risk Factors  Advanced age   Other Active Problems  Hypokalemia 2.8  Low grade fever  Altered mental status out of proportion to patient's infarct.  Mild leukocytosis  Other Pertinent History   PLAN  Check UA and culture  Check chest x-ray  Supplement potassium  Check EEG  Social worker consult  [facility] day # [place] PA-C [facility] Pager [phone] 10/22/2014, 2:36 PM I have personally examined this patient, reviewed notes, independently viewed imaging studies, participated in medical decision making and plan of care. I have made any additions or clarifications directly to the above note. Agree with note above. Limited with sudden onset of confusion, altered .mental status and  speech difficulties but without focal findings. MRI scan does  show  a tiny right thalamic/middle cerebral peduncle lacune which cannot explain her profound confusion. We will look for other causes of confusion and continue ongoing stroke workup.  Antony Contras, MD Medical Director [facility] Pager: [phone] 10/22/2014 5:18 PM     To contact Stroke Continuity provider, please refer to [email or web address] After hours, contact General Neurology

## 2014-10-23 ENCOUNTER — Inpatient Hospital Stay (HOSPITAL_COMMUNITY): Payer: Medicare HMO

## 2014-10-23 DIAGNOSIS — G9340 Encephalopathy, unspecified: Secondary | ICD-10-CM | POA: Insufficient documentation

## 2014-10-23 DIAGNOSIS — G3189 Other specified degenerative diseases of nervous system: Secondary | ICD-10-CM

## 2014-10-23 DIAGNOSIS — I639 Cerebral infarction, unspecified: Secondary | ICD-10-CM | POA: Insufficient documentation

## 2014-10-23 DIAGNOSIS — I998 Other disorder of circulatory system: Secondary | ICD-10-CM

## 2014-10-23 DIAGNOSIS — R7881 Bacteremia: Secondary | ICD-10-CM | POA: Insufficient documentation

## 2014-10-23 DIAGNOSIS — R4701 Aphasia: Secondary | ICD-10-CM

## 2014-10-23 DIAGNOSIS — R569 Unspecified convulsions: Secondary | ICD-10-CM | POA: Insufficient documentation

## 2014-10-23 DIAGNOSIS — I6330 Cerebral infarction due to thrombosis of unspecified cerebral artery: Secondary | ICD-10-CM

## 2014-10-23 LAB — URINE CULTURE
Colony Count: NO GROWTH
Culture: NO GROWTH

## 2014-10-23 LAB — CBC
HCT: 39.1 % (ref 36.0–46.0)
Hemoglobin: 13.7 g/dL (ref 12.0–15.0)
MCH: 31.9 pg (ref 26.0–34.0)
MCHC: 35 g/dL (ref 30.0–36.0)
MCV: 91.1 fL (ref 78.0–100.0)
PLATELETS: 406 10*3/uL — AB (ref 150–400)
RBC: 4.29 MIL/uL (ref 3.87–5.11)
RDW: 13.4 % (ref 11.5–15.5)
WBC: 7.8 10*3/uL (ref 4.0–10.5)

## 2014-10-23 LAB — COMPREHENSIVE METABOLIC PANEL
ALBUMIN: 2.8 g/dL — AB (ref 3.5–5.2)
ALT: 14 U/L (ref 0–35)
AST: 17 U/L (ref 0–37)
Alkaline Phosphatase: 61 U/L (ref 39–117)
Anion gap: 7 (ref 5–15)
BILIRUBIN TOTAL: 0.7 mg/dL (ref 0.3–1.2)
BUN: 9 mg/dL (ref 6–23)
CHLORIDE: 101 mmol/L (ref 96–112)
CO2: 32 mmol/L (ref 19–32)
CREATININE: 0.46 mg/dL — AB (ref 0.50–1.10)
Calcium: 8.4 mg/dL (ref 8.4–10.5)
GFR calc Af Amer: >90 mL/min (ref >90)
GFR calc non Af Amer: >90 mL/min (ref >90)
Glucose, Bld: 236 mg/dL — ABNORMAL HIGH (ref 70–99)
Potassium: 2.9 mmol/L — ABNORMAL LOW (ref 3.5–5.1)
SODIUM: 140 mmol/L (ref 135–145)
Total Protein: 6.2 g/dL (ref 6.0–8.3)

## 2014-10-23 LAB — TROPONIN I: Troponin I: <0.03 ng/mL (ref <0.031)

## 2014-10-23 LAB — GLUCOSE, CAPILLARY
GLUCOSE-CAPILLARY: 207 mg/dL — AB (ref 70–99)
GLUCOSE-CAPILLARY: 264 mg/dL — AB (ref 70–99)
Glucose-Capillary: 320 mg/dL — ABNORMAL HIGH (ref 70–99)
Glucose-Capillary: 294 mg/dL — ABNORMAL HIGH (ref 70–99)

## 2014-10-23 LAB — HEMOGLOBIN A1C
Hgb A1c MFr Bld: 10.1 % — ABNORMAL HIGH (ref 4.8–5.6)
Mean Plasma Glucose: 243 mg/dL

## 2014-10-23 LAB — VITAMIN B12: Vitamin B-12: 605 pg/mL (ref 211–911)

## 2014-10-23 MED ORDER — POTASSIUM CHLORIDE CRYS ER 20 MEQ PO TBCR: 40.0000 meq | EXTENDED_RELEASE_TABLET | Freq: Once | ORAL | Status: DC

## 2014-10-23 MED ORDER — SODIUM CHLORIDE 0.9 % IV SOLN
INTRAVENOUS | Status: DC
  Filled 2014-10-23 (×7): qty 1000

## 2014-10-23 MED ORDER — LEVETIRACETAM 250 MG PO TABS
250.0000 mg | ORAL_TABLET | Freq: Two times a day (BID) | ORAL | Status: DC
  Administered 2014-10-23 – 2014-10-24 (×2): 250 mg via ORAL
  Filled 2014-10-23: qty 1

## 2014-10-23 MED ORDER — INSULIN DETEMIR 100 UNIT/ML ~~LOC~~ SOLN
10.0000 [IU] | Freq: Every day | SUBCUTANEOUS | Status: DC
  Filled 2014-10-23: qty 0.1

## 2014-10-23 MED ORDER — LEVETIRACETAM IN NACL 500 MG/100ML IV SOLN
500.0000 mg | Freq: Once | INTRAVENOUS | Status: AC
  Administered 2014-10-23: 500 mg via INTRAVENOUS
  Filled 2014-10-23: qty 100

## 2014-10-23 MED ORDER — MAGNESIUM SULFATE IN D5W 10-5 MG/ML-% IV SOLN
1.0000 g | Freq: Once | INTRAVENOUS | Status: AC
  Administered 2014-10-23: 1 g via INTRAVENOUS
  Filled 2014-10-23: qty 100

## 2014-10-23 MED ORDER — VANCOMYCIN HCL IN DEXTROSE 1-5 GM/200ML-% IV SOLN
1000.0000 mg | INTRAVENOUS | Status: DC
  Administered 2014-10-23: 1000 mg via INTRAVENOUS
  Filled 2014-10-23 (×2): qty 200

## 2014-10-23 MED ORDER — POTASSIUM CHLORIDE CRYS ER 20 MEQ PO TBCR
40.0000 meq | EXTENDED_RELEASE_TABLET | Freq: Two times a day (BID) | ORAL | Status: DC
  Administered 2014-10-23 – 2014-10-25 (×4): 40 meq via ORAL
  Filled 2014-10-23 (×5): qty 2

## 2014-10-23 MED ORDER — METOPROLOL TARTRATE 12.5 MG HALF TABLET
12.5000 mg | ORAL_TABLET | Freq: Two times a day (BID) | ORAL | Status: DC
  Administered 2014-10-23 (×2): 12.5 mg via ORAL
  Filled 2014-10-23 (×2): qty 1

## 2014-10-23 MED ORDER — POTASSIUM CHLORIDE 10 MEQ/100ML IV SOLN
10.0000 meq | INTRAVENOUS | Status: AC
  Administered 2014-10-23 (×4): 10 meq via INTRAVENOUS
  Filled 2014-10-23 (×2): qty 100

## 2014-10-23 MED ORDER — INSULIN DETEMIR 100 UNIT/ML ~~LOC~~ SOLN
15.0000 [IU] | Freq: Every day | SUBCUTANEOUS | Status: DC
  Administered 2014-10-23: 15 [IU] via SUBCUTANEOUS
  Filled 2014-10-23 (×2): qty 0.15

## 2014-10-23 MED ORDER — AMLODIPINE BESYLATE 5 MG PO TABS
5.0000 mg | ORAL_TABLET | Freq: Every day | ORAL | Status: DC
  Administered 2014-10-23: 5 mg via ORAL
  Filled 2014-10-23: qty 1

## 2014-10-23 NOTE — Progress Notes (Signed)
   Patient is scheduled for TEE 10/24/14 with Dr. Meda Coffee to rule out vegetation/ LAA thrombus in the setting of acute CVA. I explained purpose as well as procedural details including possible risk. All questions and concerns were addressed. The patient is in agreement. RN will obtain written consent. She will be NPO at midnight. TEE/consent orders have been placed.   SIMMONS, BRITTAINY 10/23/2014

## 2014-10-23 NOTE — Progress Notes (Signed)
CRITICAL VALUE ALERT  Critical value received:  Blood Cultures, Gram + cocci in clusters  Date of notification:  10/23/14  Time of notification:  8984  Critical value read back:Yes.    Nurse who received alert:  Shara Blazing  MD notified (1st page):  Abrol   Time of first page:  36  MD notified (2nd page):  Time of second page:  Responding MD:    Time MD responded:

## 2014-10-23 NOTE — Progress Notes (Signed)
Echocardiogram 2D Echocardiogram has been performed.  Darlina Sicilian M 10/23/2014, 2:42 PM

## 2014-10-23 NOTE — Progress Notes (Signed)
STROKE TEAM PROGRESS NOTE   HISTORY April [name] is a 74 y.o. female history of HTN, DM presenting for evaluation of altered mental status. Patient brought in by neighbor who noted patient was acting differently today. Having trouble getting her words out, speech didn't always make sense and she appeared to be having difficulty walking. She noted that her car had not been moved in 3 days. LKW unknown.  CT head imaging reviewed. Shows no acute process. Patient with leukocytosis of 12.4 in ED. BP elevated with SBP in the 180s adn 190s.   tPA Given: no, outside tPA window   SUBJECTIVE (INTERVAL HISTORY) Her daughter is at the bedside. EEG show underlying seizure activity. She is still confused with difficulty understanding and getting her words out.    OBJECTIVE Temp:  [97.3 F (36.3 C)-99.1 F (37.3 C)] 99.1 F (37.3 C) (03/07 1008) Pulse Rate:  [80-93] 93 (03/07 1008) Cardiac Rhythm:  [-]  Resp:  [14-20] 18 (03/07 1008) BP: (137-196)/(64-92) 181/84 mmHg (03/07 1008) SpO2:  [92 %-100 %] 100 % (03/07 1008)   Recent Labs Lab 10/22/14 1148 10/22/14 1649 10/22/14 2154 10/23/14 0651 10/23/14 1034  GLUCAP 173* 342* 203* 207* 320*    Recent Labs Lab 10/21/14 1625 10/22/14 1337 10/23/14 0737  NA 137 139 140  K 2.8* 2.6* 2.9*  CL 90* 94* 101  CO2 34* 36* 32  GLUCOSE 364* 177* 236*  BUN 14 14 9   CREATININE 0.64 0.58 0.46*  CALCIUM 9.5 8.9 8.4  MG  --  1.9  --     Recent Labs Lab 10/21/14 1625 10/22/14 1337 10/23/14 0737  AST 17 18 17   ALT 14 13 14   ALKPHOS 68 67 61  BILITOT 1.0 0.9 0.7  PROT 7.9 7.2 6.2  ALBUMIN 3.7 3.4* 2.8*    Recent Labs Lab 10/21/14 1625 10/23/14 0737  WBC 12.4* 7.8  NEUTROABS 7.2  --   HGB 15.6* 13.7  HCT 44.1 39.1  MCV 90.4 91.1  PLT 439* 406*    Recent Labs Lab 10/22/14 1337 10/22/14 1810 10/23/14 0059  TROPONINI <0.03 <0.03 <0.03    Recent Labs  10/21/14 1625  LABPROT 13.6  INR 1.03    Recent Labs   10/22/14 1311  COLORURINE AMBER*  LABSPEC 1.026  PHURINE 6.0  GLUCOSEU >1000*  HGBUR NEGATIVE  BILIRUBINUR SMALL*  KETONESUR 40*  PROTEINUR 30*  UROBILINOGEN 1.0  NITRITE NEGATIVE  LEUKOCYTESUR NEGATIVE       Component Value Date/Time   CHOL 169 10/22/2014 0549   TRIG 178* 10/22/2014 0549   HDL 28* 10/22/2014 0549   CHOLHDL 6.0 10/22/2014 0549   VLDL 36 10/22/2014 0549   LDLCALC 105* 10/22/2014 0549   Lab Results  Component Value Date   HGBA1C 10.1* 10/22/2014      Component Value Date/Time   LABOPIA NONE DETECTED 10/22/2014 1311   COCAINSCRNUR NONE DETECTED 10/22/2014 1311   LABBENZ NONE DETECTED 10/22/2014 1311   AMPHETMU NONE DETECTED 10/22/2014 1311   THCU NONE DETECTED 10/22/2014 1311   LABBARB NONE DETECTED 10/22/2014 1311    No results for input(s): ETH in the last 168 hours.  Ct Head Wo Contrast 10/21/2014    No evidence of acute intracranial abnormality.  Moderate to severe chronic small-vessel white matter ischemic changes.     MRI HEAD    10/21/2014    1. 5 mm focus of restricted diffusion within the inferior right thalamus as above, highly suspicious for a small acute ischemic infarct. No  associated hemorrhage or mass effect.  2. No other acute intracranial process.  3. Remote lacunar infarcts within the right thalamus and pons.  4. Generalized cerebral atrophy with moderate chronic microvascular ischemic disease.    MRA HEAD   10/21/2014    1. No proximal branch occlusion or hemodynamically significant stenosis within the intracranial circulation.  2. Fetal origin of the PCAs bilaterally with diminutive vertebrobasilar system. There is mild atherosclerotic stenosis within the mid basilar artery.  3. Distal branch atheromatous irregularity within the MCA and PCA branches bilaterally.     Carotid Doppler  There is 1-39% bilateral ICA stenosis. Vertebral artery flow is antegrade.     PHYSICAL EXAM Frail elderly Caucasian lady not in distress. .  Afebrile. Head is nontraumatic. Neck is supple without bruit.    Cardiac exam no murmur or gallop. Lungs are clear to auscultation. Distal pulses are well felt. Neurological Exam : Awake alert disoriented speech is nonfluent but clear. Able to name herself and her family member at the bedside Follows only simple midline and one-step commands. Diminished attention, registration and recall. Blinks to threat bilaterally. Fundi could not be visualized. Face is symmetric without weakness. Tongue is midline. Motor system exam reveals no focal weakness and is also extremity well against gravity. Deep tendon pulses are symmetric. Plantars are downgoing. Gait was not tested.  ASSESSMENT/PLAN Ms. April [name] is a 74 y.o. female with a history of diabetes mellitus and hypertension presenting with altered mental status, speech difficulties, and gait disturbance. She did not receive IV t-PA due to late presentation.  Gram positive sepsis  Low grade fever  Mild leukocytosis  UA unrevealing. Final culture pending   chest x-ray with bronchitis  TEE scheduled for tomorrow to rule out endocarditis  Seizure  Altered mental status out of proportion to patient's infarct. Confusion likely secondary to unwitnessed seizure. Should improve once medication starts  EEG with 3 episodes of seizure  Loaded with Keppra 500 mg IV followed by 250 mg bid  Stroke:  Incidental non-dominant  infarct of the  inferior right thalamus likely secondary to small vessel disease. It cannot explain her presenting confusion.  MRI  tiny right thalamic/middle cerebral peduncle lacune which cannot explain her profound confusion.  Resultant  Altered mental status,confusion, delirium  Carotid Doppler  1-39% ICA stenosis. Vertebral artery flow is antegrade.  2D Echo  pending   TEE scheduled for tomorrow  UDS neg  Subcutaneous heparin for VTE prophylaxis  DIET DYS 3 with thin liquids  no antithrombotic prior to  admission, now on aspirin 325 mg orally every day  Ongoing aggressive stroke risk factor management  Therapy recommendations: CIR  Disposition:  pending   Hypertension  Home meds:   none  Stable  Hyperlipidemia  Home meds:  none   LDL 105, goal < 70  Now on Lipitor 20 mg daily  Continue statin at discharge  Diabetes  HgbA1c 10.1, goal < 7.0  Uncontrolled  Other Stroke Risk Factors  Advanced age  Other Active Problems  Hypokalemia 2.8. Replace K  Urinary retention, cath placed  Social worker consult  Hospital day # [place] for Pager information 10/23/2014 1:10 PM  I have personally examined this patient, reviewed notes, independently viewed imaging studies, participated in medical decision making and plan of care. I have made any additions or clarifications directly to the above note. Agree with note above. I had a long discussion with the patient's family  member at the bedside and explained the finding of EEG showing radiographic seizures suggests that it is possible the patient's initial presentation may have been related to postictal confusion versus nonconvulsive status as the MRI scan shows a very tiny infarct which is unlikely to explain her profound change in mental status. We will treat her with IV Keppra 500 mg 1 followed by 250 mg twice daily. I expect ongoing neurological improvement over the next day or so.  Antony Contras, MD Medical Director [facility] Pager: [phone] 10/23/2014 3:21 PM   To contact Stroke Continuity provider, please refer to [email or web address] After hours, contact General Neurology

## 2014-10-23 NOTE — Consult Note (Signed)
  [facility]  Total days of antibiotics 1        Day 1 vanco               Reason for Consult: bacteremia    Referring Physician: abrol  Principal Problem:   Aphasia Active Problems:   Uncontrolled diabetes mellitus type 2 without complications   HYPERTENSION, BENIGN SYSTEMIC   Cerebral thrombosis with cerebral infarction   Convulsion    HPI: April [name] is a 73 y.o. female with past med hx of DM, HTN, with no prior hx of being hospitalized who was found to have word finding difficulties possibly for > 1 day brought into the ED for evaluation. She lives by herself, has 2 dogs. Neighbors reported that she didn't move her car for 3 days. She denies any fever, chills, but still has word finding difficulties. She was admitted on 3/05 for stroke work up.s he had mild leukocytosis of 12K, initial Head Ct was nonrevealing. She did undergo MRI revealed  5 mm focus of restricted diffusion within the inferior right thalamus as above, highly suspicious for a small acute ischemic infarct.  Remote lacunar infarcts within the right thalamus and pons.   Generalized cerebral atrophy with moderate chronic microvascular ischemic disease. 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 She has been placed on anti-epileptics this afternoon, and she has started to have improvement with both expressive and receptive speech. She remains afebrile. 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  Past Medical History  Diagnosis Date  . Diabetes mellitus   . Hypertension   . Retinopathy 06/06/2013    Per eye exam 05/31/13 eye exam, Moderate retinopathy     Allergies:  Allergies  Allergen Reactions  . Codeine Nausea And Vomiting  . Sulfa Antibiotics Nausea And Vomiting    MEDICATIONS: . amLODipine  5 mg Oral Daily  . aspirin  300 mg Rectal Daily   Or  . aspirin  325 mg Oral Daily  . atorvastatin   20 mg Oral q1800  . heparin  5,000 Units Subcutaneous 3 times per day  . Influenza vac split quadrivalent PF  0.5 mL Intramuscular Tomorrow-1000  . insulin aspart  0-5 Units Subcutaneous QHS  . insulin aspart  0-9 Units Subcutaneous TID WC  . insulin detemir  15 Units Subcutaneous Daily  . levETIRAcetam  250 mg Oral BID  . metoprolol tartrate  12.5 mg Oral BID  . pneumococcal 23 valent vaccine  0.5 mL Intramuscular Tomorrow-1000  . potassium chloride  40 mEq Oral BID  . vancomycin  1,000 mg Intravenous Q24H    History  Substance Use Topics  . Smoking status: Never Smoker   . Smokeless tobacco: Never Used  . Alcohol Use: No    Family History  Problem Relation Age of Onset  . Arthritis Maternal Grandmother   . Heart disease Maternal Grandmother 80     Review of Systems  Constitutional: Negative for fever, chills, diaphoresis, activity change, appetite change, fatigue and unexpected weight change.  HENT: Negative for congestion, sore throat, rhinorrhea, sneezing, trouble swallowing and sinus pressure.  Eyes: Negative for photophobia and visual disturbance.  Respiratory: Negative for cough, chest tightness, shortness of breath, wheezing and stridor.  Cardiovascular: Negative for chest pain, palpitations and leg swelling.  Gastrointestinal: Negative for nausea, vomiting, abdominal pain, diarrhea, constipation, blood in stool, abdominal distention and anal bleeding.    Genitourinary: Negative for dysuria, hematuria, flank pain and difficulty urinating.  Musculoskeletal: Negative for myalgias, back pain, joint swelling, arthralgias and gait problem.  Skin: Negative for color change, pallor, rash and wound.  Neurological: difficulty with word finding Hematological: Negative for adenopathy. Does not bruise/bleed easily.  Psychiatric/Behavioral: Negative for behavioral problems, confusion, sleep disturbance, dysphoric mood, decreased concentration and agitation.     OBJECTIVE: Temp:   [98.1 F (36.7 C)-99.1 F (37.3 C)] 98.8 F (37.1 C) (03/07 1333) Pulse Rate:  [80-93] 86 (03/07 1333) Resp:  [14-20] 18 (03/07 1333) BP: (137-196)/(64-88) 174/83 mmHg (03/07 1333) SpO2:  [92 %-100 %] 96 % (03/07 1333) Physical Exam  Constitutional:  oriented to person, place, and time. appears well-developed and well-nourished. No distress.  HENT:  Mouth/Throat: Oropharynx is clear and moist. No oropharyngeal exudate.  Cardiovascular: Normal rate, regular rhythm and normal heart sounds. Exam reveals no gallop and no friction rub.  No murmur heard.  Pulmonary/Chest: Effort normal and breath sounds normal. No respiratory distress.  has no wheezes.  Abdominal: Soft. Bowel sounds are normal.  exhibits no distension. There is no tenderness.  Lymphadenopathy: no cervical adenopathy.  Neurological: alert and oriented to person, place. She repeats herself in an attempt to find correct words to answer questions. No focal weakness. CN2-12 intact. 5/5 motor in all extremities. Skin: Skin is warm and dry. No rash noted. No erythema.  Psychiatric: a normal mood and affect.  behavior is normal.    LABS: Results for orders placed or performed during the hospital encounter of 10/21/14 (from the past 48 hour(s))  Glucose, capillary     Status: Abnormal   Collection Time: 10/22/14  4:24 AM  Result Value Ref Range   Glucose-Capillary 176 (H) 70 - 99 mg/dL   Comment 1 Notify RN    Comment 2 Documented in Char   Hemoglobin A1c     Status: Abnormal   Collection Time: 10/22/14  5:49 AM  Result Value Ref Range   Hgb A1c MFr Bld 10.1 (H) 4.8 - 5.6 %    Comment: (NOTE)         Pre-diabetes: 5.7 - 6.4         Diabetes: >6.4         Glycemic control for adults with diabetes: <7.0    Mean Plasma Glucose 243 mg/dL    Comment: (NOTE) Performed At: [facility] [place], [place] [id number] Lindon Romp MD WU:[id number]   Lipid panel     Status: Abnormal   Collection Time:  10/22/14  5:49 AM  Result Value Ref Range   Cholesterol 169 0 - 200 mg/dL   Triglycerides 178 (H) <150 mg/dL   HDL 28 (L) >39 mg/dL   Total CHOL/HDL Ratio 6.0 RATIO   VLDL 36 0 - 40 mg/dL   LDL Cholesterol 105 (H) 0 - 99 mg/dL    Comment:        Total Cholesterol/HDL:CHD Risk Coronary Heart Disease Risk Table                     Men   Women  1/2 Average Risk   3.4   3.3  Average Risk       5.0   4.4  2 X Average Risk   9.6   7.1  3 X Average Risk  23.4   11.0        Use the calculated Patient Ratio above and the CHD Risk Table to determine  the patient's CHD Risk.        ATP III CLASSIFICATION (LDL):  <100     mg/dL   Optimal  100-129  mg/dL   Near or Above                    Optimal  130-159  mg/dL   Borderline  160-189  mg/dL   High  >190     mg/dL   Very High   Glucose, capillary     Status: Abnormal   Collection Time: 10/22/14  8:10 AM  Result Value Ref Range   Glucose-Capillary 164 (H) 70 - 99 mg/dL  Glucose, capillary     Status: Abnormal   Collection Time: 10/22/14 11:48 AM  Result Value Ref Range   Glucose-Capillary 173 (H) 70 - 99 mg/dL  Urinalysis, Routine w reflex microscopic     Status: Abnormal   Collection Time: 10/22/14  1:11 PM  Result Value Ref Range   Color, Urine AMBER (A) YELLOW    Comment: BIOCHEMICALS MAY BE AFFECTED BY COLOR   APPearance CLEAR CLEAR   Specific Gravity, Urine 1.026 1.005 - 1.030   pH 6.0 5.0 - 8.0   Glucose, UA >1000 (A) NEGATIVE mg/dL   Hgb urine dipstick NEGATIVE NEGATIVE   Bilirubin Urine SMALL (A) NEGATIVE   Ketones, ur 40 (A) NEGATIVE mg/dL   Protein, ur 30 (A) NEGATIVE mg/dL   Urobilinogen, UA 1.0 0.0 - 1.0 mg/dL   Nitrite NEGATIVE NEGATIVE   Leukocytes, UA NEGATIVE NEGATIVE  Urine rapid drug screen (hosp performed)     Status: None   Collection Time: 10/22/14  1:11 PM  Result Value Ref Range   Opiates NONE DETECTED NONE DETECTED   Cocaine NONE DETECTED NONE DETECTED   Benzodiazepines NONE DETECTED NONE DETECTED    Amphetamines NONE DETECTED NONE DETECTED   Tetrahydrocannabinol NONE DETECTED NONE DETECTED   Barbiturates NONE DETECTED NONE DETECTED    Comment:        DRUG SCREEN FOR MEDICAL PURPOSES ONLY.  IF CONFIRMATION IS NEEDED FOR ANY PURPOSE, NOTIFY LAB WITHIN 5 DAYS.        LOWEST DETECTABLE LIMITS FOR URINE DRUG SCREEN Drug Class       Cutoff (ng/mL) Amphetamine      1000 Barbiturate      200 Benzodiazepine   200 Tricyclics       300 Opiates          300 Cocaine          300 THC              50   Urine microscopic-add on     Status: Abnormal   Collection Time: 10/22/14  1:11 PM  Result Value Ref Range   WBC, UA 0-2 <3 WBC/hpf   RBC / HPF 0-2 <3 RBC/hpf   Casts HYALINE CASTS (A) NEGATIVE  Vitamin B12     Status: None   Collection Time: 10/22/14  1:37 PM  Result Value Ref Range   Vitamin B-12 605 211 - 911 pg/mL    Comment: Performed at [facility]  TSH     Status: None   Collection Time: 10/22/14  1:37 PM  Result Value Ref Range   TSH 1.533 0.350 - 4.500 uIU/mL  Magnesium     Status: None   Collection Time: 10/22/14  1:37 PM  Result Value Ref Range   Magnesium 1.9 1.5 - 2.5 mg/dL  Troponin I     Status:   None   Collection Time: 10/22/14  1:37 PM  Result Value Ref Range   Troponin I <0.03 <0.031 ng/mL    Comment:        NO INDICATION OF MYOCARDIAL INJURY.   Comprehensive metabolic panel     Status: Abnormal   Collection Time: 10/22/14  1:37 PM  Result Value Ref Range   Sodium 139 135 - 145 mmol/L   Potassium 2.6 (LL) 3.5 - 5.1 mmol/L    Comment: REPEATED TO VERIFY CRITICAL RESULT CALLED TO, READ BACK BY AND VERIFIED WITH: C BURKEEN,RN 1456 10/22/14 D BRADLEY    Chloride 94 (L) 96 - 112 mmol/L   CO2 36 (H) 19 - 32 mmol/L   Glucose, Bld 177 (H) 70 - 99 mg/dL   BUN 14 6 - 23 mg/dL   Creatinine, Ser 0.58 0.50 - 1.10 mg/dL   Calcium 8.9 8.4 - 10.5 mg/dL   Total Protein 7.2 6.0 - 8.3 g/dL   Albumin 3.4 (L) 3.5 - 5.2 g/dL   AST 18 0 - 37 U/L   ALT 13 0 - 35  U/L   Alkaline Phosphatase 67 39 - 117 U/L   Total Bilirubin 0.9 0.3 - 1.2 mg/dL   GFR calc non Af Amer 89 (L) >90 mL/min   GFR calc Af Amer >90 >90 mL/min    Comment: (NOTE) The eGFR has been calculated using the CKD EPI equation. This calculation has not been validated in all clinical situations. eGFR's persistently <90 mL/min signify possible Chronic Kidney Disease.    Anion gap 9 5 - 15  Culture, blood (routine x 2)     Status: None (Preliminary result)   Collection Time: 10/22/14  1:47 PM  Result Value Ref Range   Specimen Description BLOOD LEFT ARM    Special Requests BOTTLES DRAWN AEROBIC ONLY 3 CC    Culture      GRAM POSITIVE COCCI IN CLUSTERS Note: Gram Stain Report Called to,Read Back By and Verified With: JAMIE WHITE 10/23/14 1307 BY SMITHERSJ Performed at [facility]    Report Status PENDING   Lactic acid, plasma     Status: None   Collection Time: 10/22/14  1:47 PM  Result Value Ref Range   Lactic Acid, Venous 0.9 0.5 - 2.0 mmol/L  Glucose, capillary     Status: Abnormal   Collection Time: 10/22/14  4:49 PM  Result Value Ref Range   Glucose-Capillary 342 (H) 70 - 99 mg/dL  Troponin I     Status: None   Collection Time: 10/22/14  6:10 PM  Result Value Ref Range   Troponin I <0.03 <0.031 ng/mL    Comment:        NO INDICATION OF MYOCARDIAL INJURY.   Glucose, capillary     Status: Abnormal   Collection Time: 10/22/14  9:54 PM  Result Value Ref Range   Glucose-Capillary 203 (H) 70 - 99 mg/dL   Comment 1 Notify RN    Comment 2 Documented in Char   Troponin I     Status: None   Collection Time: 10/23/14 12:59 AM  Result Value Ref Range   Troponin I <0.03 <0.031 ng/mL    Comment:        NO INDICATION OF MYOCARDIAL INJURY.   Glucose, capillary     Status: Abnormal   Collection Time: 10/23/14  6:51 AM  Result Value Ref Range   Glucose-Capillary 207 (H) 70 - 99 mg/dL   Comment 1 Notify RN      Comment 2 Documented in Char   CBC     Status: Abnormal     Collection Time: 10/23/14  7:37 AM  Result Value Ref Range   WBC 7.8 4.0 - 10.5 K/uL   RBC 4.29 3.87 - 5.11 MIL/uL   Hemoglobin 13.7 12.0 - 15.0 g/dL   HCT 39.1 36.0 - 46.0 %   MCV 91.1 78.0 - 100.0 fL   MCH 31.9 26.0 - 34.0 pg   MCHC 35.0 30.0 - 36.0 g/dL   RDW 13.4 11.5 - 15.5 %   Platelets 406 (H) 150 - 400 K/uL  Comprehensive metabolic panel     Status: Abnormal   Collection Time: 10/23/14  7:37 AM  Result Value Ref Range   Sodium 140 135 - 145 mmol/L   Potassium 2.9 (L) 3.5 - 5.1 mmol/L   Chloride 101 96 - 112 mmol/L   CO2 32 19 - 32 mmol/L   Glucose, Bld 236 (H) 70 - 99 mg/dL   BUN 9 6 - 23 mg/dL   Creatinine, Ser 0.46 (L) 0.50 - 1.10 mg/dL   Calcium 8.4 8.4 - 10.5 mg/dL   Total Protein 6.2 6.0 - 8.3 g/dL   Albumin 2.8 (L) 3.5 - 5.2 g/dL   AST 17 0 - 37 U/L   ALT 14 0 - 35 U/L   Alkaline Phosphatase 61 39 - 117 U/L   Total Bilirubin 0.7 0.3 - 1.2 mg/dL   GFR calc non Af Amer >90 >90 mL/min   GFR calc Af Amer >90 >90 mL/min    Comment: (NOTE) The eGFR has been calculated using the CKD EPI equation. This calculation has not been validated in all clinical situations. eGFR's persistently <90 mL/min signify possible Chronic Kidney Disease.    Anion gap 7 5 - 15  Glucose, capillary     Status: Abnormal   Collection Time: 10/23/14 10:34 AM  Result Value Ref Range   Glucose-Capillary 320 (H) 70 - 99 mg/dL   Comment 1 Notify RN   Glucose, capillary     Status: Abnormal   Collection Time: 10/23/14  4:06 PM  Result Value Ref Range   Glucose-Capillary 294 (H) 70 - 99 mg/dL    MICRO: 3/6 urine cx ngtd 3/6 blood cx 1 of 2 sets gpcc IMAGING: Dg Chest 2 View  10/22/2014   CLINICAL DATA:  Initial evaluation Fever and altered mental status.H/o diabetes and HTN ppm  EXAM: CHEST  2 VIEW  COMPARISON:  None.  FINDINGS: Mild cardiac enlargement. Uncoiling of the aorta. Vascular pattern normal. Mild bilateral bronchitic change. Mild diffuse interstitial prominence. No  consolidation or effusion.  IMPRESSION: Bronchitic change with diffuse interstitial prominence most suggestive of chronic mild interstitial lung disease. Atypical bilateral pneumonia considered less likely.   Electronically Signed   By: Raymond  Rubner M.D.   On: 10/22/2014 18:15   Ct Head Wo Contrast  10/21/2014   CLINICAL DATA:  Acute aphasia, confusion and altered mental status.  EXAM: CT HEAD WITHOUT CONTRAST  TECHNIQUE: Contiguous axial images were obtained from the base of the skull through the vertex without intravenous contrast.  COMPARISON:  03/03/2010 MRI and 03/02/2010 CT  FINDINGS: Moderate to severe chronic small-vessel white matter ischemic changes are again identified.  No acute intracranial abnormalities are identified, including mass lesion or mass effect, hydrocephalus, extra-axial fluid collection, midline shift, hemorrhage, or acute infarction.  The visualized bony calvarium is unremarkable.  IMPRESSION: No evidence of acute intracranial abnormality.  Moderate to severe chronic small-vessel white   matter ischemic changes.   Electronically Signed   By: Margarette Canada M.D.   On: 10/21/2014 19:53   Mr Brain Wo Contrast  10/21/2014   CLINICAL DATA:  Initial evaluation for acute altered mental status. History of hypertension, diabetes.  EXAM: MRI HEAD WITHOUT CONTRAST  MRA HEAD WITHOUT CONTRAST  TECHNIQUE: Multiplanar, multiecho pulse sequences of the brain and surrounding structures were obtained without intravenous contrast. Angiographic images of the head were obtained using MRA technique without contrast.  COMPARISON:  Prior head CT from earlier the same day.  FINDINGS: MRI HEAD FINDINGS  Diffuse prominence of the CSF containing spaces is compatible with generalized cerebral atrophy. Patchy and confluent T2/FLAIR hyperintensity within the periventricular and deep white matter both cerebral hemispheres noted, most compatible with moderate chronic small vessel ischemic disease.  No mass lesion or  midline shift. Ventricular prominence roller global parenchymal volume loss present without hydrocephalus. No extra-axial fluid collection.  No findings to suggest posterior reversible encephalopathy syndrome.  Small remote lacunar infarct present within the right pons. Additional small remote lacunar infarcts within the right thalamus.  There is a small 5 mm focus of high signal intensity on DWI signal in the inferolateral right thalamus, extending towards the right middle cerebral peduncle, suspicious for a small acute ischemic infarct (series 4, image 20). No findings on T2 weighted sequence or ADC map to suggest that this is T2 shine through. No associated hemorrhage or significant mass effect. No other acute ischemic infarct.  Craniocervical junction within normal limits. Pituitary gland normal. No acute abnormality seen about the orbits. Paranasal sinuses clear. Mastoid no mastoid effusion.  Bone marrow signal intensity normal. Mild degenerative changes noted within the upper cervical spine. Scalp soft tissues within normal limits.  MRA HEAD FINDINGS  ANTERIOR CIRCULATION:  Visualized distal cervical segments of the internal carotid arteries are widely patent with antegrade flow. The petrous segments are widely patent. There is mild multi focal atherosclerotic irregularity within the cavernous carotid arteries bilaterally without flow-limiting stenosis. These changes are slightly worse on the left. Supraclinoid segments widely patent. A1 segments, anterior communicating artery common anterior cerebral artery is well opacified.  M1 segments widely patent without occlusion or stenosis. MCA bifurcation is normal. Atheromatous irregularity present within the distal MCA branches bilaterally.  POSTERIOR CIRCULATION:  Vertebral arteries somewhat diminutive but patent bilaterally to the vertebrobasilar junction. Posterior inferior cerebellar arteries are patent proximal Ing, not well evaluated distally. Mild multi  focal atherosclerotic irregularity present throughout the basilar artery with superimposed mild stenosis within the mid basilar artery (series 505, image 12). Basilar artery itself is diminutive. Anterior inferior cerebral arteries are patent proximally. Superior cerebellar arteries patent. There is fetal origin of the posterior cerebral arteries bilaterally with widely patent posterior communicating arteries. Mild distal branch irregularity present within the PCA arteries bilaterally. Distal right PCA not well visualized, and may be partially occluded.  No aneurysm or vascular malformation.  IMPRESSION: MRI HEAD IMPRESSION:  1. 5 mm focus of restricted diffusion within the inferior right thalamus as above, highly suspicious for a small acute ischemic infarct. No associated hemorrhage or mass effect. 2. No other acute intracranial process. 3. Remote lacunar infarcts within the right thalamus and pons. 4. Generalized cerebral atrophy with moderate chronic microvascular ischemic disease.  MRA HEAD IMPRESSION:  1. No proximal branch occlusion or hemodynamically significant stenosis within the intracranial circulation. 2. Fetal origin of the PCAs bilaterally with diminutive vertebrobasilar system. There is mild atherosclerotic stenosis within the mid basilar artery. 3.  Distal branch atheromatous irregularity within the MCA and PCA branches bilaterally.   Electronically Signed   By: Jeannine Boga M.D.   On: 10/21/2014 22:48   Mr Jodene Nam Head/brain Wo Cm  10/21/2014   CLINICAL DATA:  Initial evaluation for acute altered mental status. History of hypertension, diabetes.  EXAM: MRI HEAD WITHOUT CONTRAST  MRA HEAD WITHOUT CONTRAST  TECHNIQUE: Multiplanar, multiecho pulse sequences of the brain and surrounding structures were obtained without intravenous contrast. Angiographic images of the head were obtained using MRA technique without contrast.  COMPARISON:  Prior head CT from earlier the same day.  FINDINGS: MRI HEAD  FINDINGS  Diffuse prominence of the CSF containing spaces is compatible with generalized cerebral atrophy. Patchy and confluent T2/FLAIR hyperintensity within the periventricular and deep white matter both cerebral hemispheres noted, most compatible with moderate chronic small vessel ischemic disease.  No mass lesion or midline shift. Ventricular prominence roller global parenchymal volume loss present without hydrocephalus. No extra-axial fluid collection.  No findings to suggest posterior reversible encephalopathy syndrome.  Small remote lacunar infarct present within the right pons. Additional small remote lacunar infarcts within the right thalamus.  There is a small 5 mm focus of high signal intensity on DWI signal in the inferolateral right thalamus, extending towards the right middle cerebral peduncle, suspicious for a small acute ischemic infarct (series 4, image 20). No findings on T2 weighted sequence or ADC map to suggest that this is T2 shine through. No associated hemorrhage or significant mass effect. No other acute ischemic infarct.  Craniocervical junction within normal limits. Pituitary gland normal. No acute abnormality seen about the orbits. Paranasal sinuses clear. Mastoid no mastoid effusion.  Bone marrow signal intensity normal. Mild degenerative changes noted within the upper cervical spine. Scalp soft tissues within normal limits.  MRA HEAD FINDINGS  ANTERIOR CIRCULATION:  Visualized distal cervical segments of the internal carotid arteries are widely patent with antegrade flow. The petrous segments are widely patent. There is mild multi focal atherosclerotic irregularity within the cavernous carotid arteries bilaterally without flow-limiting stenosis. These changes are slightly worse on the left. Supraclinoid segments widely patent. A1 segments, anterior communicating artery common anterior cerebral artery is well opacified.  M1 segments widely patent without occlusion or stenosis. MCA  bifurcation is normal. Atheromatous irregularity present within the distal MCA branches bilaterally.  POSTERIOR CIRCULATION:  Vertebral arteries somewhat diminutive but patent bilaterally to the vertebrobasilar junction. Posterior inferior cerebellar arteries are patent proximal Ing, not well evaluated distally. Mild multi focal atherosclerotic irregularity present throughout the basilar artery with superimposed mild stenosis within the mid basilar artery (series 505, image 12). Basilar artery itself is diminutive. Anterior inferior cerebral arteries are patent proximally. Superior cerebellar arteries patent. There is fetal origin of the posterior cerebral arteries bilaterally with widely patent posterior communicating arteries. Mild distal branch irregularity present within the PCA arteries bilaterally. Distal right PCA not well visualized, and may be partially occluded.  No aneurysm or vascular malformation.  IMPRESSION: MRI HEAD IMPRESSION:  1. 5 mm focus of restricted diffusion within the inferior right thalamus as above, highly suspicious for a small acute ischemic infarct. No associated hemorrhage or mass effect. 2. No other acute intracranial process. 3. Remote lacunar infarcts within the right thalamus and pons. 4. Generalized cerebral atrophy with moderate chronic microvascular ischemic disease.  MRA HEAD IMPRESSION:  1. No proximal branch occlusion or hemodynamically significant stenosis within the intracranial circulation. 2. Fetal origin of the PCAs bilaterally with diminutive vertebrobasilar system. There is  mild atherosclerotic stenosis within the mid basilar artery. 3. Distal branch atheromatous irregularity within the MCA and PCA branches bilaterally.   Electronically Signed   By: Jeannine Boga M.D.   On: 10/21/2014 22:48    Assessment/Plan:  74yo F who presents with expressive aphasia, altered mental status found to have possilbe embolic stroke as well as occipital strokes, now started on  anti-epileptics with some improvement. Work up for infection showing 1 of 2 sets blood cx having gram positive cocci in clusters.   - continue with vancomycin - await id of blood cx pathogen - favoring contaminant but will wait til further information returns  - agree with getting TEE to see if she is having embolic stroke - AMS appears improving with anti-epileptic for seizure management.  Elzie Rings [place] [facility] [phone]

## 2014-10-23 NOTE — Progress Notes (Signed)
SLP Cancellation Note  Patient Details Name: April [name] MRN: [id number] DOB: 01-31-1941   Cancelled treatment:       Reason Eval/Treat Not Completed: Other (comment). Pts family requested SLP hold cognitive linguistic eval until seizure medication given as they are hopeful for improvement.    Jakie Debow, Katherene Ponto 10/23/2014, 2:15 PM

## 2014-10-23 NOTE — Progress Notes (Signed)
EEG Completed; Results Pending  

## 2014-10-23 NOTE — Progress Notes (Signed)
Noted therapy recommendations for inpatient rehab and rehab consult has already been ordered. We will await completion of rehab consult and an admission coordinator will follow up at that time.   Thanks.  Nanetta Batty, PT Rehabilitation Admissions Coordinator [phone]

## 2014-10-23 NOTE — Consult Note (Signed)
Physical Medicine and Rehabilitation Consult  Reason for Consult: RUE weakness, gait problems and confusion Referring Physician: Dr. Allyson Sabal   HPI: April [name] is a 74 y.o. female with history of DM type2 with retinopathy, HTN who was admitted on 10/21/14  Past being found by neighbor who noted that paitent was acting differently and had difficulty speaking. Patient noted to have leucocytosis with elevated BP in ED as well as hypokalemia with K-2.8.  MRI/MRA brain done revealing 5 mm restricted diffusion right thalamus suspicious for ischemic infarct and generalized cerebral atrophy with moderate SVD, fetal origin of B-PCA with diminutive VBS and no significant stenosis. Carotid dopplers without significant ICA stenosis. 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 Swallow evaluation done yesterday and patient started on dysphagia 3 diet due to family reports of possible esophageal dysphagia.Patient has had low grade fever as well as persistent hypokalemia.   PT evaluation done and patient needs cues for sequencing and safety due to language deficits and has ataxic gait with difficulty walking. CIR recommended by MD and Rehab team.    Review of Systems  Constitutional: Positive for fever.  Neurological: Positive for tremors, sensory change and speech change.      Past Medical History  Diagnosis Date  . Diabetes mellitus   . Hypertension   . Retinopathy 06/06/2013    Per eye exam 05/31/13 eye exam, Moderate retinopathy     Past Surgical History  Procedure Laterality Date  . Appendectomy  1972  . Abdominal hysterectomy  1972    Family History  Problem Relation Age of Onset  . Arthritis Maternal Grandmother   . Heart disease Maternal Grandmother 80    Social History:  Lives alone. reports that she has never smoked. She has never used smokeless tobacco. She reports that she does not drink alcohol or use illicit drugs.     Allergies  Allergen Reactions  . Codeine Nausea And Vomiting  . Sulfa Antibiotics Nausea And Vomiting    No prescriptions prior to admission    Home: Home Living Family/patient expects to be discharged to:: Assisted living Living Arrangements: Alone Home Equipment: None  Functional History: Prior Function Level of Independence: Independent Functional Status:  Mobility: Bed Mobility Overal bed mobility: Needs Assistance Bed Mobility: Supine to Sit Supine to sit: Min assist General bed mobility comments: assist for movement initiation and sequencing due to receptive language deficits Transfers Overall transfer level: Needs assistance Equipment used: None Transfers: Sit to/from Stand, Stand Pivot Transfers Sit to Stand: Min assist Stand pivot transfers: Min assist General transfer comment: assist for sequencing, technique and safety due to receptive language deficits Ambulation/Gait Ambulation/Gait assistance: Min assist Ambulation Distance (Feet): 10 Feet Assistive device: None Gait Pattern/deviations: Step-through pattern, Ataxic Gait velocity: mildly decreased General Gait Details: Distance limited by IV needing to remain plugged in (dead battery).    ADL:    Cognition: Cognition Overall Cognitive Status: Difficult to assess Orientation Level: Oriented to person, Oriented to place, Oriented to time Cognition Arousal/Alertness: Awake/alert Behavior During Therapy: Flat affect Overall Cognitive Status: Difficult to assess Difficult to assess due to: Impaired communication  Blood pressure 196/88, pulse 85, temperature 99 F (37.2 C), temperature source Oral, resp. rate 14, height 4\' 11"  (1.499 m), weight 48.217 kg (106 lb 4.8 oz), last menstrual period 08/18/1970, SpO2 93 %. Physical Exam  Cardiovascular: Normal rate.   Respiratory: No respiratory distress.  GI: She exhibits no distension.  Neurological: She has normal reflexes. No cranial nerve deficit.  Coordination normal.  Confused. Slow to process and communicate. ?visual acuity.  Moves all 4's but inconsistent. Slower with right arm. Poor balance and leg movement.     Results for orders placed or performed during the hospital encounter of 10/21/14 (from the past 24 hour(s))  Glucose, capillary     Status: Abnormal   Collection Time: 10/22/14 11:48 AM  Result Value Ref Range   Glucose-Capillary 173 (H) 70 - 99 mg/dL  Urinalysis, Routine w reflex microscopic     Status: Abnormal   Collection Time: 10/22/14  1:11 PM  Result Value Ref Range   Color, Urine AMBER (A) YELLOW   APPearance CLEAR CLEAR   Specific Gravity, Urine 1.026 1.005 - 1.030   pH 6.0 5.0 - 8.0   Glucose, UA >1000 (A) NEGATIVE mg/dL   Hgb urine dipstick NEGATIVE NEGATIVE   Bilirubin Urine SMALL (A) NEGATIVE   Ketones, ur 40 (A) NEGATIVE mg/dL   Protein, ur 30 (A) NEGATIVE mg/dL   Urobilinogen, UA 1.0 0.0 - 1.0 mg/dL   Nitrite NEGATIVE NEGATIVE   Leukocytes, UA NEGATIVE NEGATIVE  Urine rapid drug screen (hosp performed)     Status: None   Collection Time: 10/22/14  1:11 PM  Result Value Ref Range   Opiates NONE DETECTED NONE DETECTED   Cocaine NONE DETECTED NONE DETECTED   Benzodiazepines NONE DETECTED NONE DETECTED   Amphetamines NONE DETECTED NONE DETECTED   Tetrahydrocannabinol NONE DETECTED NONE DETECTED   Barbiturates NONE DETECTED NONE DETECTED  Urine microscopic-add on     Status: Abnormal   Collection Time: 10/22/14  1:11 PM  Result Value Ref Range   WBC, UA 0-2 <3 WBC/hpf   RBC / HPF 0-2 <3 RBC/hpf   Casts HYALINE CASTS (A) NEGATIVE  TSH     Status: None   Collection Time: 10/22/14  1:37 PM  Result Value Ref Range   TSH 1.533 0.350 - 4.500 uIU/mL  Magnesium     Status: None   Collection Time: 10/22/14  1:37 PM  Result Value Ref Range   Magnesium 1.9 1.5 - 2.5 mg/dL  Troponin I     Status: None   Collection Time: 10/22/14  1:37 PM  Result Value Ref Range   Troponin I <0.03 <0.031 ng/mL    Comprehensive metabolic panel     Status: Abnormal   Collection Time: 10/22/14  1:37 PM  Result Value Ref Range   Sodium 139 135 - 145 mmol/L   Potassium 2.6 (LL) 3.5 - 5.1 mmol/L   Chloride 94 (L) 96 - 112 mmol/L   CO2 36 (H) 19 - 32 mmol/L   Glucose, Bld 177 (H) 70 - 99 mg/dL   BUN 14 6 - 23 mg/dL   Creatinine, Ser 0.58 0.50 - 1.10 mg/dL   Calcium 8.9 8.4 - 10.5 mg/dL   Total Protein 7.2 6.0 - 8.3 g/dL   Albumin 3.4 (L) 3.5 - 5.2 g/dL   AST 18 0 - 37 U/L   ALT 13 0 - 35 U/L   Alkaline Phosphatase 67 39 - 117 U/L   Total Bilirubin 0.9 0.3 - 1.2 mg/dL   GFR calc non Af Amer 89 (L) >90 mL/min   GFR calc Af Amer >90 >90 mL/min   Anion gap 9 5 - 15  Lactic acid, plasma     Status: None   Collection Time: 10/22/14  1:47 PM  Result Value Ref Range   Lactic Acid,  Venous 0.9 0.5 - 2.0 mmol/L  Glucose, capillary     Status: Abnormal   Collection Time: 10/22/14  4:49 PM  Result Value Ref Range   Glucose-Capillary 342 (H) 70 - 99 mg/dL  Troponin I     Status: None   Collection Time: 10/22/14  6:10 PM  Result Value Ref Range   Troponin I <0.03 <0.031 ng/mL  Glucose, capillary     Status: Abnormal   Collection Time: 10/22/14  9:54 PM  Result Value Ref Range   Glucose-Capillary 203 (H) 70 - 99 mg/dL   Comment 1 Notify RN    Comment 2 Documented in Char   Troponin I     Status: None   Collection Time: 10/23/14 12:59 AM  Result Value Ref Range   Troponin I <0.03 <0.031 ng/mL  Glucose, capillary     Status: Abnormal   Collection Time: 10/23/14  6:51 AM  Result Value Ref Range   Glucose-Capillary 207 (H) 70 - 99 mg/dL   Comment 1 Notify RN    Comment 2 Documented in Char    Dg Chest 2 View  10/22/2014   CLINICAL DATA:  Initial evaluation Fever and altered mental status.H/o diabetes and HTN ppm  EXAM: CHEST  2 VIEW  COMPARISON:  None.  FINDINGS: Mild cardiac enlargement. Uncoiling of the aorta. Vascular pattern normal. Mild bilateral bronchitic change. Mild diffuse interstitial  prominence. No consolidation or effusion.  IMPRESSION: Bronchitic change with diffuse interstitial prominence most suggestive of chronic mild interstitial lung disease. Atypical bilateral pneumonia considered less likely.   Electronically Signed   By: Skipper Cliche M.D.   On: 10/22/2014 18:15   Ct Head Wo Contrast  10/21/2014   CLINICAL DATA:  Acute aphasia, confusion and altered mental status.  EXAM: CT HEAD WITHOUT CONTRAST  TECHNIQUE: Contiguous axial images were obtained from the base of the skull through the vertex without intravenous contrast.  COMPARISON:  03/03/2010 MRI and 03/02/2010 CT  FINDINGS: Moderate to severe chronic small-vessel white matter ischemic changes are again identified.  No acute intracranial abnormalities are identified, including mass lesion or mass effect, hydrocephalus, extra-axial fluid collection, midline shift, hemorrhage, or acute infarction.  The visualized bony calvarium is unremarkable.  IMPRESSION: No evidence of acute intracranial abnormality.  Moderate to severe chronic small-vessel white matter ischemic changes.   Electronically Signed   By: Margarette Canada M.D.   On: 10/21/2014 19:53   Mr Brain Wo Contrast  10/21/2014   CLINICAL DATA:  Initial evaluation for acute altered mental status. History of hypertension, diabetes.  EXAM: MRI HEAD WITHOUT CONTRAST  MRA HEAD WITHOUT CONTRAST  TECHNIQUE: Multiplanar, multiecho pulse sequences of the brain and surrounding structures were obtained without intravenous contrast. Angiographic images of the head were obtained using MRA technique without contrast.  COMPARISON:  Prior head CT from earlier the same day.  FINDINGS: MRI HEAD FINDINGS  Diffuse prominence of the CSF containing spaces is compatible with generalized cerebral atrophy. Patchy and confluent T2/FLAIR hyperintensity within the periventricular and deep white matter both cerebral hemispheres noted, most compatible with moderate chronic small vessel ischemic disease.  No  mass lesion or midline shift. Ventricular prominence roller global parenchymal volume loss present without hydrocephalus. No extra-axial fluid collection.  No findings to suggest posterior reversible encephalopathy syndrome.  Small remote lacunar infarct present within the right pons. Additional small remote lacunar infarcts within the right thalamus.  There is a small 5 mm focus of high signal intensity on DWI signal in the  inferolateral right thalamus, extending towards the right middle cerebral peduncle, suspicious for a small acute ischemic infarct (series 4, image 20). No findings on T2 weighted sequence or ADC map to suggest that this is T2 shine through. No associated hemorrhage or significant mass effect. No other acute ischemic infarct.  Craniocervical junction within normal limits. Pituitary gland normal. No acute abnormality seen about the orbits. Paranasal sinuses clear. Mastoid no mastoid effusion.  Bone marrow signal intensity normal. Mild degenerative changes noted within the upper cervical spine. Scalp soft tissues within normal limits.  MRA HEAD FINDINGS  ANTERIOR CIRCULATION:  Visualized distal cervical segments of the internal carotid arteries are widely patent with antegrade flow. The petrous segments are widely patent. There is mild multi focal atherosclerotic irregularity within the cavernous carotid arteries bilaterally without flow-limiting stenosis. These changes are slightly worse on the left. Supraclinoid segments widely patent. A1 segments, anterior communicating artery common anterior cerebral artery is well opacified.  M1 segments widely patent without occlusion or stenosis. MCA bifurcation is normal. Atheromatous irregularity present within the distal MCA branches bilaterally.  POSTERIOR CIRCULATION:  Vertebral arteries somewhat diminutive but patent bilaterally to the vertebrobasilar junction. Posterior inferior cerebellar arteries are patent proximal Ing, not well evaluated distally.  Mild multi focal atherosclerotic irregularity present throughout the basilar artery with superimposed mild stenosis within the mid basilar artery (series 505, image 12). Basilar artery itself is diminutive. Anterior inferior cerebral arteries are patent proximally. Superior cerebellar arteries patent. There is fetal origin of the posterior cerebral arteries bilaterally with widely patent posterior communicating arteries. Mild distal branch irregularity present within the PCA arteries bilaterally. Distal right PCA not well visualized, and may be partially occluded.  No aneurysm or vascular malformation.  IMPRESSION: MRI HEAD IMPRESSION:  1. 5 mm focus of restricted diffusion within the inferior right thalamus as above, highly suspicious for a small acute ischemic infarct. No associated hemorrhage or mass effect. 2. No other acute intracranial process. 3. Remote lacunar infarcts within the right thalamus and pons. 4. Generalized cerebral atrophy with moderate chronic microvascular ischemic disease.  MRA HEAD IMPRESSION:  1. No proximal branch occlusion or hemodynamically significant stenosis within the intracranial circulation. 2. Fetal origin of the PCAs bilaterally with diminutive vertebrobasilar system. There is mild atherosclerotic stenosis within the mid basilar artery. 3. Distal branch atheromatous irregularity within the MCA and PCA branches bilaterally.   Electronically Signed   By: Jeannine Boga M.D.   On: 10/21/2014 22:48   Mr Jodene Nam Head/brain Wo Cm  10/21/2014   CLINICAL DATA:  Initial evaluation for acute altered mental status. History of hypertension, diabetes.  EXAM: MRI HEAD WITHOUT CONTRAST  MRA HEAD WITHOUT CONTRAST  TECHNIQUE: Multiplanar, multiecho pulse sequences of the brain and surrounding structures were obtained without intravenous contrast. Angiographic images of the head were obtained using MRA technique without contrast.  COMPARISON:  Prior head CT from earlier the same day.   FINDINGS: MRI HEAD FINDINGS  Diffuse prominence of the CSF containing spaces is compatible with generalized cerebral atrophy. Patchy and confluent T2/FLAIR hyperintensity within the periventricular and deep white matter both cerebral hemispheres noted, most compatible with moderate chronic small vessel ischemic disease.  No mass lesion or midline shift. Ventricular prominence roller global parenchymal volume loss present without hydrocephalus. No extra-axial fluid collection.  No findings to suggest posterior reversible encephalopathy syndrome.  Small remote lacunar infarct present within the right pons. Additional small remote lacunar infarcts within the right thalamus.  There is a small 5 mm focus  of high signal intensity on DWI signal in the inferolateral right thalamus, extending towards the right middle cerebral peduncle, suspicious for a small acute ischemic infarct (series 4, image 20). No findings on T2 weighted sequence or ADC map to suggest that this is T2 shine through. No associated hemorrhage or significant mass effect. No other acute ischemic infarct.  Craniocervical junction within normal limits. Pituitary gland normal. No acute abnormality seen about the orbits. Paranasal sinuses clear. Mastoid no mastoid effusion.  Bone marrow signal intensity normal. Mild degenerative changes noted within the upper cervical spine. Scalp soft tissues within normal limits.  MRA HEAD FINDINGS  ANTERIOR CIRCULATION:  Visualized distal cervical segments of the internal carotid arteries are widely patent with antegrade flow. The petrous segments are widely patent. There is mild multi focal atherosclerotic irregularity within the cavernous carotid arteries bilaterally without flow-limiting stenosis. These changes are slightly worse on the left. Supraclinoid segments widely patent. A1 segments, anterior communicating artery common anterior cerebral artery is well opacified.  M1 segments widely patent without occlusion or  stenosis. MCA bifurcation is normal. Atheromatous irregularity present within the distal MCA branches bilaterally.  POSTERIOR CIRCULATION:  Vertebral arteries somewhat diminutive but patent bilaterally to the vertebrobasilar junction. Posterior inferior cerebellar arteries are patent proximal Ing, not well evaluated distally. Mild multi focal atherosclerotic irregularity present throughout the basilar artery with superimposed mild stenosis within the mid basilar artery (series 505, image 12). Basilar artery itself is diminutive. Anterior inferior cerebral arteries are patent proximally. Superior cerebellar arteries patent. There is fetal origin of the posterior cerebral arteries bilaterally with widely patent posterior communicating arteries. Mild distal branch irregularity present within the PCA arteries bilaterally. Distal right PCA not well visualized, and may be partially occluded.  No aneurysm or vascular malformation.  IMPRESSION: MRI HEAD IMPRESSION:  1. 5 mm focus of restricted diffusion within the inferior right thalamus as above, highly suspicious for a small acute ischemic infarct. No associated hemorrhage or mass effect. 2. No other acute intracranial process. 3. Remote lacunar infarcts within the right thalamus and pons. 4. Generalized cerebral atrophy with moderate chronic microvascular ischemic disease.  MRA HEAD IMPRESSION:  1. No proximal branch occlusion or hemodynamically significant stenosis within the intracranial circulation. 2. Fetal origin of the PCAs bilaterally with diminutive vertebrobasilar system. There is mild atherosclerotic stenosis within the mid basilar artery. 3. Distal branch atheromatous irregularity within the MCA and PCA branches bilaterally.   Electronically Signed   By: Jeannine Boga M.D.   On: 10/21/2014 22:48    Assessment/Plan: Diagnosis: right thalamic infarct. AMS 1. Does the need for close, 24 hr/day medical supervision in concert with the patient's rehab  needs make it unreasonable for this patient to be served in a less intensive setting? Yes 2. Co-Morbidities requiring supervision/potential complications: dm2, htn, depression 3. Due to bladder management, bowel management, safety, skin/wound care, disease management, medication administration, pain management and patient education, does the patient require 24 hr/day rehab nursing? Yes 4. Does the patient require coordinated care of a physician, rehab nurse, PT (1-2 hrs/day, 5 days/week), OT (1-2 hrs/day, 5 days/week) and SLP (1-2 hrs/day, 5 days/week) to address physical and functional deficits in the context of the above medical diagnosis(es)? Yes Addressing deficits in the following areas: balance, endurance, locomotion, strength, transferring, bowel/bladder control, bathing, dressing, feeding, grooming, toileting, cognition, swallowing and psychosocial support 5. Can the patient actively participate in an intensive therapy program of at least 3 hrs of therapy per day at least 5 days per  week? Yes 6. The potential for patient to make measurable gains while on inpatient rehab is good 7. Anticipated functional outcomes upon discharge from inpatient rehab are supervision  with PT, supervision with OT, supervision with SLP. 8. Estimated rehab length of stay to reach the above functional goals is: 8-10 days 9. Does the patient have adequate social supports and living environment to accommodate these discharge functional goals? Yes 10. Anticipated D/C setting: Home 11. Anticipated post D/C treatments: [facility] 12. Overall Rehab/Functional Prognosis: excellent  RECOMMENDATIONS: This patient's condition is appropriate for continued rehabilitative care in the following setting: CIR Patient has agreed to participate in recommended program. N/A Note that insurance prior authorization may be required for reimbursement for recommended care.  Comment: Has good family supports. [facility] are willing to provide  assistance as needed after discharge.  Meredith Staggers, MD, [facility] 10/23/2014     10/23/2014

## 2014-10-23 NOTE — Progress Notes (Signed)
Foley was placed yesterday 3/6 for retention. It has been leaking since last night and has leaked throughout the day and again tonight on 3/7. Pt is able to tell when she needs to go. Urine in bag has been pink tinged with small blood clots since this morning. On call MD was made aware. Received and completed orders to remove foley cath. Will continue to monitor output and urine color.

## 2014-10-23 NOTE — Progress Notes (Signed)
ANTIBIOTIC CONSULT NOTE - INITIAL  Pharmacy Consult for Vancomycin Indication:  GPC sepsis  Allergies  Allergen Reactions  . Codeine Nausea And Vomiting  . Sulfa Antibiotics Nausea And Vomiting    Patient Measurements: Height: 4\' 11"  (149.9 cm) Weight: 106 lb 4.8 oz (48.217 kg) IBW/kg (Calculated) : 43.2  Vital Signs: Temp: 98.8 F (37.1 C) (03/07 1333) Temp Source: Oral (03/07 1333) BP: 174/83 mmHg (03/07 1333) Pulse Rate: 86 (03/07 1333) Intake/Output from previous day: 03/06 0701 - 03/07 0700 In: 240 [P.O.:240] Out: -  Intake/Output from this shift:    Labs:  Recent Labs  10/21/14 1625 10/22/14 1337 10/23/14 0737  WBC 12.4*  --  7.8  HGB 15.6*  --  13.7  PLT 439*  --  406*  CREATININE 0.64 0.58 0.46*   Estimated Creatinine Clearance: 42.7 mL/min (by C-G formula based on Cr of 0.46). No results for input(s): VANCOTROUGH, VANCOPEAK, VANCORANDOM, GENTTROUGH, GENTPEAK, GENTRANDOM, TOBRATROUGH, TOBRAPEAK, TOBRARND, AMIKACINPEAK, AMIKACINTROU, AMIKACIN in the last 72 hours.   Microbiology: Recent Results (from the past 720 hour(s))  Culture, blood (routine x 2)     Status: None (Preliminary result)   Collection Time: 10/22/14  1:47 PM  Result Value Ref Range Status   Specimen Description BLOOD LEFT ARM  Final   Special Requests BOTTLES DRAWN AEROBIC ONLY 3 CC  Final   Culture   Final    GRAM POSITIVE COCCI IN CLUSTERS Note: Gram Stain Report Called to,Read Back By and Verified With: JAMIE WHITE 10/23/14 1307 BY SMITHERSJ Performed at [facility]    Report Status PENDING  Incomplete    Medical History: Past Medical History  Diagnosis Date  . Diabetes mellitus   . Hypertension   . Retinopathy 06/06/2013    Per eye exam 05/31/13 eye exam, Moderate retinopathy     Assessment: 74 year old female admitted with CVA now with GPC in blood cultures to begin vancomycin  Goal of Therapy:  Vancomycin trough level 15-20 mcg/ml  Plan:  Vancomycin 1 gram  iv Q 24 hours Follow up progress, culture results  Thank you. Anette Guarneri, PharmD [phone]  10/23/2014,1:35 PM

## 2014-10-23 NOTE — Procedures (Signed)
ELECTROENCEPHALOGRAM REPORT   Patient: April [name]       Room #: 4N10 EEG No. ID: [id number] Age: 74 y.o.        Sex: female Referring Physician: Allyson Sabal Report Date:  10/23/2014        Interpreting Physician: Alexis Goodell  History: [name] is an 74 y.o. female with altered mental status  Medications:  Scheduled: . amLODipine  5 mg Oral Daily  . aspirin  300 mg Rectal Daily   Or  . aspirin  325 mg Oral Daily  . atorvastatin  20 mg Oral q1800  . heparin  5,000 Units Subcutaneous 3 times per day  . Influenza vac split quadrivalent PF  0.5 mL Intramuscular Tomorrow-1000  . insulin aspart  0-5 Units Subcutaneous QHS  . insulin aspart  0-9 Units Subcutaneous TID WC  . insulin detemir  15 Units Subcutaneous Daily  . levETIRAcetam  500 mg Intravenous Once   Followed by  . levETIRAcetam  250 mg Oral BID  . metoprolol tartrate  12.5 mg Oral BID  . pneumococcal 23 valent vaccine  0.5 mL Intramuscular Tomorrow-1000  . potassium chloride  40 mEq Oral BID    Conditions of Recording:  This is a 16 channel EEG carried out with the patient in the awake state.  Description:  The waking background activity consists of a low voltage, symmetrical, fairly well organized, 9 Hz alpha activity, seen from the parieto-occipital and posterior temporal regions.  Low voltage fast activity, poorly organized, is seen anteriorly and is at times superimposed on more posterior regions.  A mixture of theta and alpha rhythms are seen from the central and temporal regions. The patient does not drowse or sleep. There are three occasions during the recording when there is a buildup of occipital spike and slow wave activity from the left occipital region.  It achieves a frequency of 3-4 Hz with some spread noted to the right hemisphere as well.  These episodes last from 100 to 155 seconds with normal awake background activity returning after these periods.  There is no change in the clinical appearance of  the patient during these episodes.   Hyperventilation and intermittent photic stimulation were not performed.   IMPRESSION: This is an abnormal electroencephalogram secondary to three electrographic seizures noted during the recording without clinical change.  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     Alexis Goodell, MD [facility] [phone] 10/23/2014, 12:41 PM

## 2014-10-23 NOTE — Progress Notes (Addendum)
[facility] PROGRESS NOTE  April [name] OAC:[id number] DOB: 18-Sep-1940 DOA: 10/21/2014 PCP: Nance Pear., NP  Assessment/Plan: Principal Problem:   Aphasia Active Problems:   Uncontrolled diabetes mellitus type 2 without complications   HYPERTENSION, BENIGN SYSTEMIC   Cerebral thrombosis with cerebral infarction    Acute CVA MRI/MRA of the brain shows 5 mm focus of restricted diffusion within the inferior right thalamus as above, highly suspicious for a small acute ischemic infarct. ,Remote lacunar infarcts within the right thalamus and pons. Carotid Doppler 1-39% ICA stenosis. Vertebral artery flow is antegrade.  EEG results pending PT/OT CIR/speech regular diet and thin liquids 2-D echo pending, carotid Doppler 1-39% ICA stenosis. Vertebral artery flow is antegrade. Discussed with neurology Dr.Sethi, encephalopathy and physical exam suggestive of other acute processes Chest x-ray, UA, UDS negative, TSH, lactic acid, vitamin B-12 within normal limits Cycle cardiac enzymes negative LDL 105, HDL 28, triglycerides 178  Gram-positive bacteremia Patient blood culture positive 2 for gram-positive cocci in clusters Started the patient on vancomycin The patient will need a TEE Infectious disease dr   sinder  consulted   Toxic encephalopathy? Related to dehydration Replete potassium Check UA, chest x-ray, blood culture, lactic acid negative Vitamin B-12, TSH, stable  Hypokalemia replete  Diabetes mellitus Hemoglobin A1c 10.1, continue SSI, start the patient on Levemir 15 units Continue sliding scale insulin  Hypertension, uncontrolled Start the patient on Norvasc and beta blocker    Code Status: full Family Communication: family updated about patient's clinical progress Disposition Plan:  Pending CIR approval   Brief narrative: April [name] is an 74 y.o. female history of HTN, DM presenting for evaluation of altered mental status. Patient  brought in by neighbor who noted patient was acting differently today. Having trouble getting her words out, speech didn't always make sense and she appeared to be having difficulty walking. She notes that her car had not been moved in 3 days.   Consultants:  Neurology  Procedures:  None  Antibiotics: None  HPI/Subjective: Patient continues to be dysarthric, family by the bedside  Objective: Filed Vitals:   10/23/14 0144 10/23/14 0300 10/23/14 0653 10/23/14 1008  BP: 193/85 178/76 196/88 181/84  Pulse: 82  85 93  Temp: 98.2 F (36.8 C)  99 F (37.2 C) 99.1 F (37.3 C)  TempSrc: Oral  Oral Oral  Resp: 18  14 18   Height:      Weight:      SpO2: 92%  93% 100%    Intake/Output Summary (Last 24 hours) at 10/23/14 1222 Last data filed at 10/23/14 0700  Gross per 24 hour  Intake    240 ml  Output      0 ml  Net    240 ml    Exam:  General: Patient confused and unable to speak in full sentences Lungs: Clear to auscultation bilaterally without wheezes or crackles Cardiovascular: Regular rate and rhythm without murmur gallop or rub normal S1 and S2 Abdomen: Nontender, nondistended, soft, bowel sounds positive, no rebound, no ascites, no appreciable mass Extremities: No significant cyanosis, clubbing, or edema bilateral lower extremities      Data Reviewed: Basic Metabolic Panel:  Recent Labs Lab 10/21/14 1625 10/22/14 1337 10/23/14 0737  NA 137 139 140  K 2.8* 2.6* 2.9*  CL 90* 94* 101  CO2 34* 36* 32  GLUCOSE 364* 177* 236*  BUN 14 14 9   CREATININE 0.64 0.58 0.46*  CALCIUM 9.5 8.9 8.4  MG  --  1.9  --  Liver Function Tests:  Recent Labs Lab 10/21/14 1625 10/22/14 1337 10/23/14 0737  AST 17 18 17   ALT 14 13 14   ALKPHOS 68 67 61  BILITOT 1.0 0.9 0.7  PROT 7.9 7.2 6.2  ALBUMIN 3.7 3.4* 2.8*   No results for input(s): LIPASE, AMYLASE in the last 168 hours. No results for input(s): AMMONIA in the last 168 hours.  CBC:  Recent Labs Lab  10/21/14 1625 10/23/14 0737  WBC 12.4* 7.8  NEUTROABS 7.2  --   HGB 15.6* 13.7  HCT 44.1 39.1  MCV 90.4 91.1  PLT 439* 406*    Cardiac Enzymes:  Recent Labs Lab 10/22/14 1337 10/22/14 1810 10/23/14 0059  TROPONINI <0.03 <0.03 <0.03   BNP (last 3 results) No results for input(s): BNP in the last 8760 hours.  ProBNP (last 3 results) No results for input(s): PROBNP in the last 8760 hours.    CBG:  Recent Labs Lab 10/22/14 1148 10/22/14 1649 10/22/14 2154 10/23/14 0651 10/23/14 1034  GLUCAP 173* 342* 203* 207* 320*    No results found for this or any previous visit (from the past 240 hour(s)).   Studies: Dg Chest 2 View  10/22/2014   CLINICAL DATA:  Initial evaluation Fever and altered mental status.H/o diabetes and HTN ppm  EXAM: CHEST  2 VIEW  COMPARISON:  None.  FINDINGS: Mild cardiac enlargement. Uncoiling of the aorta. Vascular pattern normal. Mild bilateral bronchitic change. Mild diffuse interstitial prominence. No consolidation or effusion.  IMPRESSION: Bronchitic change with diffuse interstitial prominence most suggestive of chronic mild interstitial lung disease. Atypical bilateral pneumonia considered less likely.   Electronically Signed   By: Skipper Cliche M.D.   On: 10/22/2014 18:15   Ct Head Wo Contrast  10/21/2014   CLINICAL DATA:  Acute aphasia, confusion and altered mental status.  EXAM: CT HEAD WITHOUT CONTRAST  TECHNIQUE: Contiguous axial images were obtained from the base of the skull through the vertex without intravenous contrast.  COMPARISON:  03/03/2010 MRI and 03/02/2010 CT  FINDINGS: Moderate to severe chronic small-vessel white matter ischemic changes are again identified.  No acute intracranial abnormalities are identified, including mass lesion or mass effect, hydrocephalus, extra-axial fluid collection, midline shift, hemorrhage, or acute infarction.  The visualized bony calvarium is unremarkable.  IMPRESSION: No evidence of acute intracranial  abnormality.  Moderate to severe chronic small-vessel white matter ischemic changes.   Electronically Signed   By: Margarette Canada M.D.   On: 10/21/2014 19:53   Mr Brain Wo Contrast  10/21/2014   CLINICAL DATA:  Initial evaluation for acute altered mental status. History of hypertension, diabetes.  EXAM: MRI HEAD WITHOUT CONTRAST  MRA HEAD WITHOUT CONTRAST  TECHNIQUE: Multiplanar, multiecho pulse sequences of the brain and surrounding structures were obtained without intravenous contrast. Angiographic images of the head were obtained using MRA technique without contrast.  COMPARISON:  Prior head CT from earlier the same day.  FINDINGS: MRI HEAD FINDINGS  Diffuse prominence of the CSF containing spaces is compatible with generalized cerebral atrophy. Patchy and confluent T2/FLAIR hyperintensity within the periventricular and deep white matter both cerebral hemispheres noted, most compatible with moderate chronic small vessel ischemic disease.  No mass lesion or midline shift. Ventricular prominence roller global parenchymal volume loss present without hydrocephalus. No extra-axial fluid collection.  No findings to suggest posterior reversible encephalopathy syndrome.  Small remote lacunar infarct present within the right pons. Additional small remote lacunar infarcts within the right thalamus.  There is a small 5 mm  focus of high signal intensity on DWI signal in the inferolateral right thalamus, extending towards the right middle cerebral peduncle, suspicious for a small acute ischemic infarct (series 4, image 20). No findings on T2 weighted sequence or ADC map to suggest that this is T2 shine through. No associated hemorrhage or significant mass effect. No other acute ischemic infarct.  Craniocervical junction within normal limits. Pituitary gland normal. No acute abnormality seen about the orbits. Paranasal sinuses clear. Mastoid no mastoid effusion.  Bone marrow signal intensity normal. Mild degenerative changes  noted within the upper cervical spine. Scalp soft tissues within normal limits.  MRA HEAD FINDINGS  ANTERIOR CIRCULATION:  Visualized distal cervical segments of the internal carotid arteries are widely patent with antegrade flow. The petrous segments are widely patent. There is mild multi focal atherosclerotic irregularity within the cavernous carotid arteries bilaterally without flow-limiting stenosis. These changes are slightly worse on the left. Supraclinoid segments widely patent. A1 segments, anterior communicating artery common anterior cerebral artery is well opacified.  M1 segments widely patent without occlusion or stenosis. MCA bifurcation is normal. Atheromatous irregularity present within the distal MCA branches bilaterally.  POSTERIOR CIRCULATION:  Vertebral arteries somewhat diminutive but patent bilaterally to the vertebrobasilar junction. Posterior inferior cerebellar arteries are patent proximal Ing, not well evaluated distally. Mild multi focal atherosclerotic irregularity present throughout the basilar artery with superimposed mild stenosis within the mid basilar artery (series 505, image 12). Basilar artery itself is diminutive. Anterior inferior cerebral arteries are patent proximally. Superior cerebellar arteries patent. There is fetal origin of the posterior cerebral arteries bilaterally with widely patent posterior communicating arteries. Mild distal branch irregularity present within the PCA arteries bilaterally. Distal right PCA not well visualized, and may be partially occluded.  No aneurysm or vascular malformation.  IMPRESSION: MRI HEAD IMPRESSION:  1. 5 mm focus of restricted diffusion within the inferior right thalamus as above, highly suspicious for a small acute ischemic infarct. No associated hemorrhage or mass effect. 2. No other acute intracranial process. 3. Remote lacunar infarcts within the right thalamus and pons. 4. Generalized cerebral atrophy with moderate chronic  microvascular ischemic disease.  MRA HEAD IMPRESSION:  1. No proximal branch occlusion or hemodynamically significant stenosis within the intracranial circulation. 2. Fetal origin of the PCAs bilaterally with diminutive vertebrobasilar system. There is mild atherosclerotic stenosis within the mid basilar artery. 3. Distal branch atheromatous irregularity within the MCA and PCA branches bilaterally.   Electronically Signed   By: Jeannine Boga M.D.   On: 10/21/2014 22:48   Mr Jodene Nam Head/brain Wo Cm  10/21/2014   CLINICAL DATA:  Initial evaluation for acute altered mental status. History of hypertension, diabetes.  EXAM: MRI HEAD WITHOUT CONTRAST  MRA HEAD WITHOUT CONTRAST  TECHNIQUE: Multiplanar, multiecho pulse sequences of the brain and surrounding structures were obtained without intravenous contrast. Angiographic images of the head were obtained using MRA technique without contrast.  COMPARISON:  Prior head CT from earlier the same day.  FINDINGS: MRI HEAD FINDINGS  Diffuse prominence of the CSF containing spaces is compatible with generalized cerebral atrophy. Patchy and confluent T2/FLAIR hyperintensity within the periventricular and deep white matter both cerebral hemispheres noted, most compatible with moderate chronic small vessel ischemic disease.  No mass lesion or midline shift. Ventricular prominence roller global parenchymal volume loss present without hydrocephalus. No extra-axial fluid collection.  No findings to suggest posterior reversible encephalopathy syndrome.  Small remote lacunar infarct present within the right pons. Additional small remote lacunar infarcts within the  right thalamus.  There is a small 5 mm focus of high signal intensity on DWI signal in the inferolateral right thalamus, extending towards the right middle cerebral peduncle, suspicious for a small acute ischemic infarct (series 4, image 20). No findings on T2 weighted sequence or ADC map to suggest that this is T2 shine  through. No associated hemorrhage or significant mass effect. No other acute ischemic infarct.  Craniocervical junction within normal limits. Pituitary gland normal. No acute abnormality seen about the orbits. Paranasal sinuses clear. Mastoid no mastoid effusion.  Bone marrow signal intensity normal. Mild degenerative changes noted within the upper cervical spine. Scalp soft tissues within normal limits.  MRA HEAD FINDINGS  ANTERIOR CIRCULATION:  Visualized distal cervical segments of the internal carotid arteries are widely patent with antegrade flow. The petrous segments are widely patent. There is mild multi focal atherosclerotic irregularity within the cavernous carotid arteries bilaterally without flow-limiting stenosis. These changes are slightly worse on the left. Supraclinoid segments widely patent. A1 segments, anterior communicating artery common anterior cerebral artery is well opacified.  M1 segments widely patent without occlusion or stenosis. MCA bifurcation is normal. Atheromatous irregularity present within the distal MCA branches bilaterally.  POSTERIOR CIRCULATION:  Vertebral arteries somewhat diminutive but patent bilaterally to the vertebrobasilar junction. Posterior inferior cerebellar arteries are patent proximal Ing, not well evaluated distally. Mild multi focal atherosclerotic irregularity present throughout the basilar artery with superimposed mild stenosis within the mid basilar artery (series 505, image 12). Basilar artery itself is diminutive. Anterior inferior cerebral arteries are patent proximally. Superior cerebellar arteries patent. There is fetal origin of the posterior cerebral arteries bilaterally with widely patent posterior communicating arteries. Mild distal branch irregularity present within the PCA arteries bilaterally. Distal right PCA not well visualized, and may be partially occluded.  No aneurysm or vascular malformation.  IMPRESSION: MRI HEAD IMPRESSION:  1. 5 mm focus  of restricted diffusion within the inferior right thalamus as above, highly suspicious for a small acute ischemic infarct. No associated hemorrhage or mass effect. 2. No other acute intracranial process. 3. Remote lacunar infarcts within the right thalamus and pons. 4. Generalized cerebral atrophy with moderate chronic microvascular ischemic disease.  MRA HEAD IMPRESSION:  1. No proximal branch occlusion or hemodynamically significant stenosis within the intracranial circulation. 2. Fetal origin of the PCAs bilaterally with diminutive vertebrobasilar system. There is mild atherosclerotic stenosis within the mid basilar artery. 3. Distal branch atheromatous irregularity within the MCA and PCA branches bilaterally.   Electronically Signed   By: Jeannine Boga M.D.   On: 10/21/2014 22:48    Scheduled Meds: . amLODipine  5 mg Oral Daily  . aspirin  300 mg Rectal Daily   Or  . aspirin  325 mg Oral Daily  . atorvastatin  20 mg Oral q1800  . heparin  5,000 Units Subcutaneous 3 times per day  . Influenza vac split quadrivalent PF  0.5 mL Intramuscular Tomorrow-1000  . insulin aspart  0-5 Units Subcutaneous QHS  . insulin aspart  0-9 Units Subcutaneous TID WC  . insulin detemir  10 Units Subcutaneous Daily  . levETIRAcetam  500 mg Intravenous Once   Followed by  . levETIRAcetam  250 mg Oral BID  . metoprolol tartrate  12.5 mg Oral BID  . pneumococcal 23 valent vaccine  0.5 mL Intramuscular Tomorrow-1000  . potassium chloride  40 mEq Oral BID   Continuous Infusions: . 0.9 % sodium chloride with kcl 75 mL/hr at 10/23/14 0301  . 0.9 %  sodium chloride with kcl      Principal Problem:   Aphasia Active Problems:   Uncontrolled diabetes mellitus type 2 without complications   HYPERTENSION, BENIGN SYSTEMIC   Cerebral thrombosis with cerebral infarction    Time spent: 40 minutes   [facility] Pager [phone]. If 7PM-7AM, please contact night-coverage at [email or web address],  password [facility] 10/23/2014, 12:22 PM  LOS: 2 days

## 2014-10-23 NOTE — Evaluation (Signed)
Occupational Therapy Evaluation Patient Details Name: April [name] MRN: [id number] DOB: [birth year] Today's Date: 10/23/2014    History of Present Illness April [name] is an 75 y.o. female history of HTN, DM presenting for evaluation of altered mental status. Patient brought in by neighbor who noted patient was acting differently today. Having trouble getting her words out, speech didn't always make sense and she appeared to be having difficulty walking.   Clinical Impression   Patient independent and living alone PTA. Patient currently requires up to min assist for functional tasks and functional mobility. Patient will benefit from acute OT to increase overall independence in the areas of ADLs, functional mobility, and overall safety in order to safely discharge to venue listed below. Patient presents with a decrease in cognition, communication difficulties, impaired balance, impaired vision, and an overall decrease in functional tasks. Patient will benefit from comprehensive rehabilitation on CIR prior to discharging back home alone.     Follow Up Recommendations  CIR;Supervision/Assistance - 24 hour    Equipment Recommendations   (TBD)    Recommendations for Other Services Rehab consult     Precautions / Restrictions Precautions Precautions: Fall Restrictions Weight Bearing Restrictions: No      Mobility Bed Mobility Overal bed mobility: Needs Assistance Bed Mobility: Supine to Sit     Supine to sit: Min guard  Transfers Overall transfer level: Needs assistance Equipment used: None Transfers: Sit to/from Stand Sit to Stand: Min assist    Balance Overall balance assessment: Needs assistance Sitting-balance support: No upper extremity supported;Feet supported Sitting balance-Leahy Scale: Fair     Standing balance support: No upper extremity supported;During functional activity Standing balance-Leahy Scale: Poor     ADL Overall ADL's : Needs  assistance/impaired Eating/Feeding: Minimal assistance;Sitting Eating/Feeding Details (indicate cue type and reason): Niece reports pt was drinking oatmeal and unable to follow command of using spoon for eating.  Grooming: Wash/dry hands;Min guard;Cueing for sequencing;Cueing for safety   Upper Body Bathing: Min guard;Cueing for sequencing;Cueing for safety   Lower Body Bathing: Minimal assistance;Sit to/from stand;Cueing for safety;Cueing for sequencing   Upper Body Dressing : Min guard;Sitting;Cueing for safety;Cueing for sequencing   Lower Body Dressing: Minimal assistance;Sit to/from stand;Cueing for safety;Cueing for sequencing   Toilet Transfer: Minimal assistance;Comfort height toilet;Ambulation;Cueing for safety;Cueing for sequencing           Functional mobility during ADLs: Minimal assistance;Cueing for safety;Cueing for sequencing General ADL Comments: Patient able to reach BLEs for LB ADLs. Patient with decreased cognition and with difficulty following commands. Patient with decreased sitting and standing balance, with tight grip on grab bars while seated on toilet. Patient also with difficult time tracking with eyes in a functional setting.      Vision Vision Assessment?: Vision impaired- to be further tested in functional context;Yes Eye Alignment: Within Functional Limits Ocular Range of Motion: Within Functional Limits Alignment/Gaze Preference: Gaze right (During static movement, patient's eyes would move > right) Tracking/Visual Pursuits: Impaired - to be further tested in functional context Visual Fields: Impaired-to be further tested in functional context Depth Perception: Undershoots          Pertinent Vitals/Pain Pain Assessment: No/denies pain     Hand Dominance Right   Extremity/Trunk Assessment Upper Extremity Assessment Upper Extremity Assessment: Difficult to assess due to impaired cognition   Lower Extremity Assessment Lower Extremity  Assessment: Defer to PT evaluation   Cervical / Trunk Assessment Cervical / Trunk Assessment: Normal   Communication Communication Communication: Expressive difficulties;Receptive difficulties  Cognition Arousal/Alertness: Awake/alert Behavior During Therapy: Flat affect Overall Cognitive Status: Impaired/Different from baseline Area of Impairment: Orientation;Memory;Following commands;Safety/judgement;Awareness;Problem solving;Attention Orientation Level:  (pt with expressive difficulties, able to verbalize orientation when asked)     Following Commands: Follows one step commands inconsistently     Problem Solving: Slow processing;Difficulty sequencing;Requires verbal cues General Comments: Patient with expressive amd receptive difficulties. When asked through questioning cues orientation questions, patient able to answer correctly.               Home Living Family/patient expects to be discharged to:: Private residence Living Arrangements: Alone Available Help at Discharge: Other (Comment) (Niece reports that pt doesn't want anyone in her house) Type of Home: House Home Access: Level entry     Home Layout: One level     Bathroom Shower/Tub: Teacher, early years/pre: Standard     Home Equipment: None          Prior Functioning/Environment Level of Independence: Independent      OT Diagnosis: Generalized weakness;Cognitive deficits   OT Problem List: Decreased strength;Decreased activity tolerance;Impaired balance (sitting and/or standing);Impaired vision/perception;Decreased cognition;Decreased safety awareness;Decreased knowledge of use of DME or AE   OT Treatment/Interventions: Self-care/ADL training;Therapeutic exercise;Neuromuscular education;Energy conservation;DME and/or AE instruction;Therapeutic activities;Visual/perceptual remediation/compensation;Patient/family education;Balance training    OT Goals(Current goals can be found in the care  plan section) Acute Rehab OT Goals Patient Stated Goal: unable to state OT Goal Formulation: With patient Time For Goal Achievement: 11/06/14 Potential to Achieve Goals: Good ADL Goals Pt Will Perform Grooming: with supervision;standing Pt Will Perform Upper Body Bathing: with set-up;sitting Pt Will Perform Lower Body Bathing: with supervision;sit to/from stand Pt Will Perform Upper Body Dressing: with set-up;sitting Pt Will Perform Lower Body Dressing: with supervision;sit to/from stand Pt Will Transfer to Toilet: with supervision;ambulating;bedside commode Pt Will Perform Toileting - Clothing Manipulation and hygiene: with set-up;sit to/from stand Pt Will Perform Tub/Shower Transfer: Tub transfer;with supervision;3 in 1;rolling walker;ambulating  OT Frequency: Min 3X/week   Barriers to D/C: Decreased caregiver support          End of Session Equipment Utilized During Treatment: Gait belt  Activity Tolerance: Patient tolerated treatment well Patient left: in chair;with call bell/phone within reach;with family/visitor present   Time: 1740-8144 OT Time Calculation (min): 28 min Charges:  OT General Charges $OT Visit: 1 Procedure OT Evaluation $Initial OT Evaluation Tier I: 1 Procedure OT Treatments $Self Care/Home Management : 8-22 mins  Dayvon Dax , MS, OTR/L, CLT Pager: [phone]  10/23/2014, 9:00 AM

## 2014-10-23 NOTE — Progress Notes (Signed)
Rehab admissions - I attempted to meet with pt but she was gone to EEG. I met with pt's niece Zigmund Daniel in pt's room in follow up to rehab MD consult. This discussion was had with pt's other niece [name] on speaker phone per their preference.   [name] (niece) shared that pt has a son but they do not know his contact information.   In discussing rehab MD projected goals with pt, pt would need supervision following a possible inpatient rehab admit. Pt lives home alone and is a very private person per nieces. Pt does not want anyone going into her home per niece.  All three nieces are supportive but they work and would not be able to provide 24 hr supervision. Pt would ultimately need SNF due to decreased family support to provide 24 hr care.  Pt has [employer] and it is not likely that [facility] would give authorization for first inpatient rehab, then a skilled nursing stay. This was discussed with pt's nieces and it is their preference for SNF. Niece shared that they have a family member who works at [employer]. I explained that Marjorie Smolder, Education officer, museum will follow up with pt/family.  I called and updated Marjorie Smolder and Loma Sousa, case Freight forwarder.  I will now sign off pt's case and we recommend that SNF be pursued in light of decreased family support for 24 hr care.  Thanks.  Nanetta Batty, PT Rehabilitation Admissions Coordinator [phone]

## 2014-10-23 NOTE — Progress Notes (Addendum)
Inpatient Diabetes Program Recommendations  AACE/ADA: New Consensus Statement on Inpatient Glycemic Control (2013)  Target Ranges:  Prepandial:   less than 140 mg/dL      Peak postprandial:   less than 180 mg/dL (1-2 hours)      Critically ill patients:  140 - 180 mg/dL   Results for MCKENSI, REDINGER (MRN [id number]) as of 10/23/2014 11:17  Ref. Range 10/22/2014 04:24 10/22/2014 08:10 10/22/2014 11:48 10/22/2014 16:49 10/22/2014 21:54 10/23/2014 06:51 10/23/2014 10:34  Glucose-Capillary Latest Range: 70-99 mg/dL 176 (H) 164 (H) 173 (H) 342 (H) 203 (H) 207 (H) 320 (H)   Diabetes history: DM Outpatient Diabetes medications: Metformin 1000 mg BID, Januvia 100 mg daily Current orders for Inpatient glycemic control: Novolog 0-9 units TID with meals, Novolog 0-5 units HS  Inpatient Diabetes Program Recommendations Insulin - Basal: CBG ranged from 164-342 mg/dl on 10/22/14 and patient received a total of Novolog 15 units for correction on 10/22/14. Please consider ordering Levemir 7 units Q24H to start now. Diet: Added Carb Modified to Dys 3 diet.  Thanks, Barnie Alderman, RN, MSN, CCRN, CDE Diabetes Coordinator Inpatient Diabetes Program [phone] (Team Pager) [phone] (AP office) [phone] [facility] office)

## 2014-10-23 NOTE — Progress Notes (Signed)
PT Cancellation Note  Patient Details Name: April [name] MRN: [id number] DOB: 1941/05/29   Cancelled Treatment:    Reason Eval/Treat Not Completed: Patient at procedure or test/unavailable   Bertie Mcconathy, Tonia Brooms 10/23/2014, 11:01 AM

## 2014-10-23 NOTE — Progress Notes (Signed)
UR complete.  Muriel Hannold RN, MSN 

## 2014-10-23 NOTE — Care Management Note (Signed)
    Page 1 of 1   10/24/2014     11:29:19 AM CARE MANAGEMENT NOTE 10/24/2014  Patient:  April [name], April [name]   Account Number:  [id number]  Date Initiated:  10/23/2014  Documentation initiated by:  Lorne Skeens  Subjective/Objective Assessment:   Patient was admitted with CVA. Lives at home alone.     Action/Plan:   Will follow for discharge needs pending PT/OT evals and physician orders.   Anticipated DC Date:     Anticipated DC Plan:  SKILLED NURSING FACILITY  In-house referral  Clinical Social Worker         Choice offered to / List presented to:             Status of service:  In process, will continue to follow Medicare Important Message given?  YES (If response is "NO", the following Medicare IM given date fields will be blank) Date Medicare IM given:  10/24/2014 Medicare IM given by:  Lorne Skeens Date Additional Medicare IM given:   Additional Medicare IM given by:    Discharge Disposition:    Per UR Regulation:  Reviewed for med. necessity/level of care/duration of stay  If discussed at [place] of Stay Meetings, dates discussed:    Comments:  10/24/14 [place], MSN, CM- Medicare IM letter provided.

## 2014-10-24 ENCOUNTER — Encounter (HOSPITAL_COMMUNITY): Payer: Self-pay | Admitting: *Deleted

## 2014-10-24 ENCOUNTER — Ambulatory Visit (HOSPITAL_COMMUNITY): Payer: Medicare Other

## 2014-10-24 ENCOUNTER — Encounter (HOSPITAL_COMMUNITY)
Admission: EM | Disposition: A | Discharge status: Skilled Nursing Facility | Payer: Self-pay | Source: Home / Self Care | Attending: Internal Medicine

## 2014-10-24 DIAGNOSIS — G40309 Generalized idiopathic epilepsy and epileptic syndromes, not intractable, without status epilepticus: Secondary | ICD-10-CM

## 2014-10-24 DIAGNOSIS — R51 Headache: Secondary | ICD-10-CM

## 2014-10-24 DIAGNOSIS — B957 Other staphylococcus as the cause of diseases classified elsewhere: Secondary | ICD-10-CM

## 2014-10-24 DIAGNOSIS — R569 Unspecified convulsions: Secondary | ICD-10-CM

## 2014-10-24 DIAGNOSIS — G40901 Epilepsy, unspecified, not intractable, with status epilepticus: Secondary | ICD-10-CM | POA: Insufficient documentation

## 2014-10-24 DIAGNOSIS — I340 Nonrheumatic mitral (valve) insufficiency: Secondary | ICD-10-CM

## 2014-10-24 HISTORY — PX: TEE WITHOUT CARDIOVERSION: SHX5443

## 2014-10-24 LAB — CBC
HCT: 38 % (ref 36.0–46.0)
Hemoglobin: 13 g/dL (ref 12.0–15.0)
MCH: 31.5 pg (ref 26.0–34.0)
MCHC: 34.2 g/dL (ref 30.0–36.0)
MCV: 92 fL (ref 78.0–100.0)
PLATELETS: 406 10*3/uL — AB (ref 150–400)
RBC: 4.13 MIL/uL (ref 3.87–5.11)
RDW: 13.3 % (ref 11.5–15.5)
WBC: 11 10*3/uL — ABNORMAL HIGH (ref 4.0–10.5)

## 2014-10-24 LAB — GLUCOSE, CAPILLARY
GLUCOSE-CAPILLARY: 229 mg/dL — AB (ref 70–99)
GLUCOSE-CAPILLARY: 192 mg/dL — AB (ref 70–99)
Glucose-Capillary: 247 mg/dL — ABNORMAL HIGH (ref 70–99)

## 2014-10-24 LAB — BASIC METABOLIC PANEL
Anion gap: 6 (ref 5–15)
BUN: 5 mg/dL — ABNORMAL LOW (ref 6–23)
CO2: 28 mmol/L (ref 19–32)
Calcium: 8.7 mg/dL (ref 8.4–10.5)
Chloride: 106 mmol/L (ref 96–112)
Creatinine, Ser: 0.47 mg/dL — ABNORMAL LOW (ref 0.50–1.10)
GFR calc Af Amer: >90 mL/min (ref >90)
GFR calc non Af Amer: >90 mL/min (ref >90)
Glucose, Bld: 234 mg/dL — ABNORMAL HIGH (ref 70–99)
Potassium: 4.4 mmol/L (ref 3.5–5.1)
Sodium: 140 mmol/L (ref 135–145)

## 2014-10-24 LAB — CULTURE, BLOOD (ROUTINE X 2)

## 2014-10-24 SURGERY — ECHOCARDIOGRAM, TRANSESOPHAGEAL
Anesthesia: Moderate Sedation

## 2014-10-24 MED ORDER — HYDRALAZINE HCL 50 MG PO TABS
50.0000 mg | ORAL_TABLET | Freq: Three times a day (TID) | ORAL | Status: DC
  Administered 2014-10-24 – 2014-10-25 (×2): 50 mg via ORAL
  Filled 2014-10-24 (×2): qty 1

## 2014-10-24 MED ORDER — MIDAZOLAM HCL 5 MG/ML IJ SOLN
INTRAMUSCULAR | Status: AC
  Filled 2014-10-24: qty 2

## 2014-10-24 MED ORDER — HYDRALAZINE HCL 20 MG/ML IJ SOLN
10.0000 mg | Freq: Once | INTRAMUSCULAR | Status: AC
  Administered 2014-10-24: 10 mg via INTRAVENOUS

## 2014-10-24 MED ORDER — HYDRALAZINE HCL 20 MG/ML IJ SOLN
INTRAMUSCULAR | Status: AC
  Filled 2014-10-24: qty 1

## 2014-10-24 MED ORDER — MIDAZOLAM HCL 10 MG/2ML IJ SOLN
INTRAMUSCULAR | Status: DC | PRN
  Administered 2014-10-24 (×2): 1 mg via INTRAVENOUS
  Administered 2014-10-24: 2 mg via INTRAVENOUS

## 2014-10-24 MED ORDER — FENTANYL CITRATE 0.05 MG/ML IJ SOLN
INTRAMUSCULAR | Status: DC | PRN
  Administered 2014-10-24: 25 ug via INTRAVENOUS
  Administered 2014-10-24 (×2): 12.5 ug via INTRAVENOUS

## 2014-10-24 MED ORDER — LEVETIRACETAM IN NACL 500 MG/100ML IV SOLN: 500.0000 mg | Freq: Once | INTRAVENOUS | Status: DC

## 2014-10-24 MED ORDER — DIPHENHYDRAMINE HCL 50 MG/ML IJ SOLN
INTRAMUSCULAR | Status: AC
  Filled 2014-10-24: qty 1

## 2014-10-24 MED ORDER — FENTANYL CITRATE 0.05 MG/ML IJ SOLN
INTRAMUSCULAR | Status: AC
  Filled 2014-10-24: qty 2

## 2014-10-24 MED ORDER — BUTAMBEN-TETRACAINE-BENZOCAINE 2-2-14 % EX AERO
INHALATION_SPRAY | CUTANEOUS | Status: DC | PRN
  Administered 2014-10-24: 2 via TOPICAL

## 2014-10-24 MED ORDER — HYDRALAZINE HCL 20 MG/ML IJ SOLN
INTRAMUSCULAR | Status: DC | PRN
  Administered 2014-10-24: 10 mg via INTRAVENOUS

## 2014-10-24 MED ORDER — METOPROLOL TARTRATE 50 MG PO TABS
50.0000 mg | ORAL_TABLET | Freq: Two times a day (BID) | ORAL | Status: DC
  Administered 2014-10-24: 50 mg via ORAL
  Filled 2014-10-24: qty 1

## 2014-10-24 MED ORDER — ONDANSETRON HCL 4 MG/2ML IJ SOLN
4.0000 mg | Freq: Four times a day (QID) | INTRAMUSCULAR | Status: DC | PRN
  Administered 2014-10-24: 4 mg via INTRAVENOUS
  Filled 2014-10-24: qty 2

## 2014-10-24 MED ORDER — INSULIN DETEMIR 100 UNIT/ML ~~LOC~~ SOLN
25.0000 [IU] | Freq: Every day | SUBCUTANEOUS | Status: DC
  Administered 2014-10-24 – 2014-10-25 (×2): 25 [IU] via SUBCUTANEOUS
  Filled 2014-10-24 (×2): qty 0.25

## 2014-10-24 MED ORDER — SODIUM CHLORIDE 0.9 % IV SOLN
INTRAVENOUS | Status: DC
  Administered 2014-10-24: 11:00:00 via INTRAVENOUS

## 2014-10-24 MED ORDER — HYDROCODONE-ACETAMINOPHEN 7.5-325 MG/15ML PO SOLN
ORAL | Status: AC
  Filled 2014-10-24: qty 15

## 2014-10-24 MED ORDER — LEVETIRACETAM IN NACL 500 MG/100ML IV SOLN
500.0000 mg | Freq: Two times a day (BID) | INTRAVENOUS | Status: DC
  Administered 2014-10-24 – 2014-10-25 (×3): 500 mg via INTRAVENOUS
  Filled 2014-10-24 (×4): qty 100

## 2014-10-24 MED ORDER — LEVETIRACETAM 500 MG PO TABS
500.0000 mg | ORAL_TABLET | Freq: Two times a day (BID) | ORAL | Status: DC
  Filled 2014-10-24: qty 1

## 2014-10-24 MED ORDER — LEVETIRACETAM 250 MG PO TABS: 250.0000 mg | ORAL_TABLET | Freq: Two times a day (BID) | ORAL | Status: DC

## 2014-10-24 MED ORDER — HYDROCODONE-ACETAMINOPHEN 7.5-325 MG/15ML PO SOLN
10.0000 mL | ORAL | Status: DC | PRN
  Administered 2014-10-24: 10 mL via ORAL

## 2014-10-24 NOTE — Progress Notes (Signed)
Pt was able to void without catheter at 0100. At this time urine was yellow. At 0500 pt was found to have moderate blood on sheets soaking through to the bed. There was dark red clots. Pt was taken to the bathroom and she voided and had a BM which also had blood and blood clots in it. MD was paged. CBC is ordered for today.

## 2014-10-24 NOTE — Progress Notes (Signed)
OT Cancellation Note  Patient Details Name: April [name] MRN: [id number] DOB: 1941-03-04   Cancelled Treatment:    Reason Eval/Treat Not Completed: Medical issues which prohibited therapy;Other (comment) (Pt with severe headache, resting at this time.)  Will check back again tomorrow.  Birdella Sippel OTR/L 10/24/2014, 3:22 PM

## 2014-10-24 NOTE — Progress Notes (Signed)
Pt c/o n&v, ~100 cc in emesis basin. 1 white dissolved pill in basin following Keppra admin. MD notified. keppra iv order zofran iv given. No further N&V

## 2014-10-24 NOTE — Clinical Social Work Placement (Addendum)
Clinical Social Work Department CLINICAL SOCIAL WORK PLACEMENT NOTE 10/24/2014  Patient:  April [name], April [name]  Account Number:  [id number] Admit date:  10/21/2014  Clinical Social Worker:  Glendon Axe, CLINICAL SOCIAL WORKER  Date/time:  10/24/2014 02:16 PM  Clinical Social Work is seeking post-discharge placement for this patient at the following level of care:   SKILLED NURSING   (*CSW will update this form in Epic as items are completed)   10/24/2014  Patient/family provided with [place] Department of Clinical Social Work's list of facilities offering this level of care within the geographic area requested by the patient (or if unable, by the patient's family).  10/24/2014  Patient/family informed of their freedom to choose among providers that offer the needed level of care, that participate in Medicare, Medicaid or managed care program needed by the patient, have an available bed and are willing to accept the patient.  10/24/2014  Patient/family informed of [facility]' ownership interest in [facility], as well as of the fact that they are under no obligation to receive care at this facility.  PASARR submitted to EDS on 10/24/2014 PASARR number received on 10/24/2014  FL2 transmitted to all facilities in geographic area requested by pt/family on  10/24/2014 FL2 transmitted to all facilities within larger geographic area on   Patient informed that his/her managed care company has contracts with or will negotiate with  certain facilities, including the following:   YES     Patient/family informed of bed offers received:  10/24/2014 Patient chooses bed at [facility] Physician recommends and patient chooses bed at    Patient to be transferred to [place]  on   10/25/2014 Patient to be transferred to facility by Pt's niece, [name] reported she will transport patient.  Patient and family notified of transfer on 10/25/2014 Name of family member notified: Pt's  niece, [name].    The following physician request were entered in Epic:   Additional Comments:   Glendon Axe, MSW, LCSWA [phone] 10/24/2014 2:16 PM

## 2014-10-24 NOTE — Progress Notes (Signed)
Physical Therapy Treatment Patient Details Name: April [name] MRN: [id number] DOB: [birth year] Today's Date: 10/24/2014    History of Present Illness April [name] is an 75 y.o. female history of HTN, DM presenting for evaluation of altered mental status. Patient brought in by neighbor who noted patient was acting differently today. Having trouble getting her words out, speech didn't always make sense and she appeared to be having difficulty walking.    PT Comments    Patient progressing well this morning and able to increase ambulation. Still with unsteady gait and required Mod A. Patient discussing discharge plans with case managers and social workers. Unsure if can go to CIR and SNF may be what is the best option for patient based on talking with them today. Will follow up tomorrow to see what they have discussed.   Follow Up Recommendations  CIR     Equipment Recommendations       Recommendations for Other Services       Precautions / Restrictions Precautions Precautions: Fall Restrictions Weight Bearing Restrictions: No    Mobility  Bed Mobility Overal bed mobility: Needs Assistance Bed Mobility: Supine to Sit     Supine to sit: Supervision     General bed mobility comments: Lots of cueing and increased time required to come to EOB  Transfers Overall transfer level: Needs assistance     Sit to Stand: Min assist         General transfer comment: assist for sequencing, technique and safety due to receptive language deficits  Ambulation/Gait Ambulation/Gait assistance: Mod assist Ambulation Distance (Feet): 60 Feet (x2) Assistive device: None;1 person hand held assist Gait Pattern/deviations: Step-through pattern;Decreased stride length Gait velocity: mildly decreased   General Gait Details: Mod A to control trunk to midline. Patient with tendency to veer to L side with ambulation. Mod cueing and increased time to come to midline.    Stairs             Wheelchair Mobility    Modified Rankin (Stroke Patients Only) Modified Rankin (Stroke Patients Only) Pre-Morbid Rankin Score: No symptoms Modified Rankin: Moderately severe disability     Balance     Sitting balance-Leahy Scale: Fair       Standing balance-Leahy Scale: Poor                      Cognition Arousal/Alertness: Awake/alert Behavior During Therapy: Flat affect Overall Cognitive Status: Impaired/Different from baseline Area of Impairment: Following commands;Awareness Orientation Level: Situation           Problem Solving: Slow processing;Difficulty sequencing;Requires verbal cues General Comments: Patient with expressive amd receptive difficulties. When asked through questioning cues orientation questions, patient able to answer correctly.     Exercises      General Comments        Pertinent Vitals/Pain Pain Assessment: No/denies pain    Home Living                      Prior Function            PT Goals (current goals can now be found in the care plan section) Progress towards PT goals: Progressing toward goals    Frequency  Min 4X/week    PT Plan Current plan remains appropriate    Co-evaluation             End of Session Equipment Utilized During Treatment: Gait belt Activity Tolerance: Patient tolerated treatment well Patient  left: in chair;with call bell/phone within reach     Time: 0954-1020 PT Time Calculation (min) (ACUTE ONLY): 26 min  Charges:  $Gait Training: 8-22 mins $Therapeutic Activity: 8-22 mins                    G Codes:      Jacqualyn Posey 10/24/2014, 10:30 AM 10/24/2014 Jacqualyn Posey PTA [phone] pager [phone] office

## 2014-10-24 NOTE — Progress Notes (Addendum)
[facility] PROGRESS NOTE  April [name] OBS:[id number] DOB: 1940-11-24 DOA: 10/21/2014 PCP: Nance Pear., NP  Assessment/Plan: Principal Problem:   Aphasia Active Problems:   Uncontrolled diabetes mellitus type 2 without complications   HYPERTENSION, BENIGN SYSTEMIC   Cerebral thrombosis with cerebral infarction   Convulsion   Cerebral infarction due to unspecified mechanism   Encephalopathy   Gram-positive cocci bacteremia    Acute CVA/seizure activity EEG show underlying seizure activity, neurology suspects this to be the primary diagnosis, started on Keppra, switched to IV due to nausea  MRI/MRA of the brain shows 5 mm focus of restricted diffusion within the inferior right thalamus , essentially shows a very tiny infarct which is unlikely to explain her profound change in mental status.. Carotid Doppler 1-39% ICA stenosis. Vertebral artery flow is antegrade.  PT/OT CIR/speech dysphagia 3 diet and thin liquids, currently nothing by mouth for a TEE 2-D echo pending, carotid Doppler 1-39% ICA stenosis. Vertebral artery flow is antegrade. Discussed with neurology Dr.Sethi, encephalopathy and physical exam suggestive of other acute processes, he suspects underlying seizure disorder Chest x-ray, UA, UDS negative, TSH, lactic acid, vitamin B-12 within normal limits Cycle cardiac enzymes negative LDL 105, HDL 28, triglycerides 178 Patient currently on aspirin 325 mg a day   Gram-positive bacteremia Patient blood culture positive 1/2, for gram-positive cocci in clusters, favor contaminant Started the patient on vancomycin Awaiting TEE to rule out embolic stroke Infectious disease dr   sinder  consulted   Toxic encephalopathy? Related to dehydration Replete potassium Check UA, chest x-ray, blood culture, lactic acid negative Vitamin B-12, TSH, stable  Hypokalemia repleted  Diabetes mellitus Hemoglobin A1c 10.1, continue SSI, increase Levemir 25  units Continue sliding scale insulin  Hypertension, uncontrolled Continue Norvasc , increase metoprolol to 50 twice a day Started hydralazine 50 mg 3 times a day    Code Status: full Family Communication: family updated about patient's clinical progress Disposition Plan:  Pending CIR approval   Brief narrative: April [name] is an 74 y.o. female history of HTN, DM presenting for evaluation of altered mental status. Patient brought in by neighbor who noted patient was acting differently today. Having trouble getting her words out, speech didn't always make sense and she appeared to be having difficulty walking. She notes that her car had not been moved in 3 days.  Found to have a small CVA but also abnormal EEG suggesting seizure activity which is being treated by [facility].  Consultants:  Neurology  Procedures:  None  Antibiotics: None  HPI/Subjective: Patient accompanied by her niece, updated family about the patient's treatment Family would prefer SNF over CIR Patient is very hypertensive this morning  Objective: Filed Vitals:   10/24/14 0918 10/24/14 0933 10/24/14 1125 10/24/14 1212  BP: 149/89 189/89 218/88 229/99  Pulse: 79 84 83 85  Temp: 97.7 F (36.5 C) 98 F (36.7 C) 97.9 F (36.6 C)   TempSrc: Oral Oral Oral   Resp:  16 16 19   Height:      Weight:      SpO2: 96% 94% 94% 98%    Intake/Output Summary (Last 24 hours) at 10/24/14 1227 Last data filed at 10/24/14 0830  Gross per 24 hour  Intake    360 ml  Output   1300 ml  Net   -940 ml    Exam:  General: Patient confused and unable to speak in full sentences Lungs: Clear to auscultation bilaterally without wheezes or crackles Cardiovascular: Regular rate and rhythm  without murmur gallop or rub normal S1 and S2 Abdomen: Nontender, nondistended, soft, bowel sounds positive, no rebound, no ascites, no appreciable mass Extremities: No significant cyanosis, clubbing, or edema bilateral lower  extremities      Data Reviewed: Basic Metabolic Panel:  Recent Labs Lab 10/21/14 1625 10/22/14 1337 10/23/14 0737 10/24/14 0752  NA 137 139 140 140  K 2.8* 2.6* 2.9* 4.4  CL 90* 94* 101 106  CO2 34* 36* 32 28  GLUCOSE 364* 177* 236* 234*  BUN 14 14 9  5*  CREATININE 0.64 0.58 0.46* 0.47*  CALCIUM 9.5 8.9 8.4 8.7  MG  --  1.9  --   --     Liver Function Tests:  Recent Labs Lab 10/21/14 1625 10/22/14 1337 10/23/14 0737  AST 17 18 17   ALT 14 13 14   ALKPHOS 68 67 61  BILITOT 1.0 0.9 0.7  PROT 7.9 7.2 6.2  ALBUMIN 3.7 3.4* 2.8*   No results for input(s): LIPASE, AMYLASE in the last 168 hours. No results for input(s): AMMONIA in the last 168 hours.  CBC:  Recent Labs Lab 10/21/14 1625 10/23/14 0737 10/24/14 0752  WBC 12.4* 7.8 11.0*  NEUTROABS 7.2  --   --   HGB 15.6* 13.7 13.0  HCT 44.1 39.1 38.0  MCV 90.4 91.1 92.0  PLT 439* 406* 406*    Cardiac Enzymes:  Recent Labs Lab 10/22/14 1337 10/22/14 1810 10/23/14 0059  TROPONINI <0.03 <0.03 <0.03   BNP (last 3 results) No results for input(s): BNP in the last 8760 hours.  ProBNP (last 3 results) No results for input(s): PROBNP in the last 8760 hours.    CBG:  Recent Labs Lab 10/23/14 0651 10/23/14 1034 10/23/14 1606 10/23/14 2236 10/24/14 0637  GLUCAP 207* 320* 294* 264* 229*    Recent Results (from the past 240 hour(s))  Culture, Urine     Status: None   Collection Time: 10/22/14  1:11 PM  Result Value Ref Range Status   Specimen Description URINE, CATHETERIZED  Final   Special Requests NONE  Final   Colony Count NO GROWTH Performed at [facility]   Final   Culture NO GROWTH Performed at [facility]   Final   Report Status 10/23/2014 FINAL  Final  Culture, blood (routine x 2)     Status: None (Preliminary result)   Collection Time: 10/22/14  1:37 PM  Result Value Ref Range Status   Specimen Description BLOOD LEFT ANTECUBITAL  Final   Special Requests  BOTTLES DRAWN AEROBIC AND ANAEROBIC 10 CC  Final   Culture   Final           BLOOD CULTURE RECEIVED NO GROWTH TO DATE CULTURE WILL BE HELD FOR 5 DAYS BEFORE ISSUING A FINAL NEGATIVE REPORT Performed at [facility]    Report Status PENDING  Incomplete  Culture, blood (routine x 2)     Status: None   Collection Time: 10/22/14  1:47 PM  Result Value Ref Range Status   Specimen Description BLOOD LEFT ARM  Final   Special Requests BOTTLES DRAWN AEROBIC ONLY 3 CC  Final   Culture   Final    STAPHYLOCOCCUS SPECIES (COAGULASE NEGATIVE) Note: THE SIGNIFICANCE OF ISOLATING THIS ORGANISM FROM A SINGLE SET OF BLOOD CULTURES WHEN MULTIPLE SETS ARE DRAWN IS UNCERTAIN. PLEASE NOTIFY THE MICROBIOLOGY DEPARTMENT WITHIN ONE WEEK IF SPECIATION AND SENSITIVITIES ARE REQUIRED. Note: Gram Stain Report Called to,Read Back By and Verified With: JAMIE WHITE 10/23/14 1307  BY SMITHERSJ Performed at [facility]    Report Status 10/24/2014 FINAL  Final     Studies: Dg Chest 2 View  10/22/2014   CLINICAL DATA:  Initial evaluation Fever and altered mental status.H/o diabetes and HTN ppm  EXAM: CHEST  2 VIEW  COMPARISON:  None.  FINDINGS: Mild cardiac enlargement. Uncoiling of the aorta. Vascular pattern normal. Mild bilateral bronchitic change. Mild diffuse interstitial prominence. No consolidation or effusion.  IMPRESSION: Bronchitic change with diffuse interstitial prominence most suggestive of chronic mild interstitial lung disease. Atypical bilateral pneumonia considered less likely.   Electronically Signed   By: Skipper Cliche M.D.   On: 10/22/2014 18:15   Ct Head Wo Contrast  10/21/2014   CLINICAL DATA:  Acute aphasia, confusion and altered mental status.  EXAM: CT HEAD WITHOUT CONTRAST  TECHNIQUE: Contiguous axial images were obtained from the base of the skull through the vertex without intravenous contrast.  COMPARISON:  03/03/2010 MRI and 03/02/2010 CT  FINDINGS: Moderate to severe chronic  small-vessel white matter ischemic changes are again identified.  No acute intracranial abnormalities are identified, including mass lesion or mass effect, hydrocephalus, extra-axial fluid collection, midline shift, hemorrhage, or acute infarction.  The visualized bony calvarium is unremarkable.  IMPRESSION: No evidence of acute intracranial abnormality.  Moderate to severe chronic small-vessel white matter ischemic changes.   Electronically Signed   By: Margarette Canada M.D.   On: 10/21/2014 19:53   Mr Brain Wo Contrast  10/21/2014   CLINICAL DATA:  Initial evaluation for acute altered mental status. History of hypertension, diabetes.  EXAM: MRI HEAD WITHOUT CONTRAST  MRA HEAD WITHOUT CONTRAST  TECHNIQUE: Multiplanar, multiecho pulse sequences of the brain and surrounding structures were obtained without intravenous contrast. Angiographic images of the head were obtained using MRA technique without contrast.  COMPARISON:  Prior head CT from earlier the same day.  FINDINGS: MRI HEAD FINDINGS  Diffuse prominence of the CSF containing spaces is compatible with generalized cerebral atrophy. Patchy and confluent T2/FLAIR hyperintensity within the periventricular and deep white matter both cerebral hemispheres noted, most compatible with moderate chronic small vessel ischemic disease.  No mass lesion or midline shift. Ventricular prominence roller global parenchymal volume loss present without hydrocephalus. No extra-axial fluid collection.  No findings to suggest posterior reversible encephalopathy syndrome.  Small remote lacunar infarct present within the right pons. Additional small remote lacunar infarcts within the right thalamus.  There is a small 5 mm focus of high signal intensity on DWI signal in the inferolateral right thalamus, extending towards the right middle cerebral peduncle, suspicious for a small acute ischemic infarct (series 4, image 20). No findings on T2 weighted sequence or ADC map to suggest that  this is T2 shine through. No associated hemorrhage or significant mass effect. No other acute ischemic infarct.  Craniocervical junction within normal limits. Pituitary gland normal. No acute abnormality seen about the orbits. Paranasal sinuses clear. Mastoid no mastoid effusion.  Bone marrow signal intensity normal. Mild degenerative changes noted within the upper cervical spine. Scalp soft tissues within normal limits.  MRA HEAD FINDINGS  ANTERIOR CIRCULATION:  Visualized distal cervical segments of the internal carotid arteries are widely patent with antegrade flow. The petrous segments are widely patent. There is mild multi focal atherosclerotic irregularity within the cavernous carotid arteries bilaterally without flow-limiting stenosis. These changes are slightly worse on the left. Supraclinoid segments widely patent. A1 segments, anterior communicating artery common anterior cerebral artery is well opacified.  M1 segments widely  patent without occlusion or stenosis. MCA bifurcation is normal. Atheromatous irregularity present within the distal MCA branches bilaterally.  POSTERIOR CIRCULATION:  Vertebral arteries somewhat diminutive but patent bilaterally to the vertebrobasilar junction. Posterior inferior cerebellar arteries are patent proximal Ing, not well evaluated distally. Mild multi focal atherosclerotic irregularity present throughout the basilar artery with superimposed mild stenosis within the mid basilar artery (series 505, image 12). Basilar artery itself is diminutive. Anterior inferior cerebral arteries are patent proximally. Superior cerebellar arteries patent. There is fetal origin of the posterior cerebral arteries bilaterally with widely patent posterior communicating arteries. Mild distal branch irregularity present within the PCA arteries bilaterally. Distal right PCA not well visualized, and may be partially occluded.  No aneurysm or vascular malformation.  IMPRESSION: MRI HEAD IMPRESSION:   1. 5 mm focus of restricted diffusion within the inferior right thalamus as above, highly suspicious for a small acute ischemic infarct. No associated hemorrhage or mass effect. 2. No other acute intracranial process. 3. Remote lacunar infarcts within the right thalamus and pons. 4. Generalized cerebral atrophy with moderate chronic microvascular ischemic disease.  MRA HEAD IMPRESSION:  1. No proximal branch occlusion or hemodynamically significant stenosis within the intracranial circulation. 2. Fetal origin of the PCAs bilaterally with diminutive vertebrobasilar system. There is mild atherosclerotic stenosis within the mid basilar artery. 3. Distal branch atheromatous irregularity within the MCA and PCA branches bilaterally.   Electronically Signed   By: Jeannine Boga M.D.   On: 10/21/2014 22:48   Mr Jodene Nam Head/brain Wo Cm  10/21/2014   CLINICAL DATA:  Initial evaluation for acute altered mental status. History of hypertension, diabetes.  EXAM: MRI HEAD WITHOUT CONTRAST  MRA HEAD WITHOUT CONTRAST  TECHNIQUE: Multiplanar, multiecho pulse sequences of the brain and surrounding structures were obtained without intravenous contrast. Angiographic images of the head were obtained using MRA technique without contrast.  COMPARISON:  Prior head CT from earlier the same day.  FINDINGS: MRI HEAD FINDINGS  Diffuse prominence of the CSF containing spaces is compatible with generalized cerebral atrophy. Patchy and confluent T2/FLAIR hyperintensity within the periventricular and deep white matter both cerebral hemispheres noted, most compatible with moderate chronic small vessel ischemic disease.  No mass lesion or midline shift. Ventricular prominence roller global parenchymal volume loss present without hydrocephalus. No extra-axial fluid collection.  No findings to suggest posterior reversible encephalopathy syndrome.  Small remote lacunar infarct present within the right pons. Additional small remote lacunar infarcts  within the right thalamus.  There is a small 5 mm focus of high signal intensity on DWI signal in the inferolateral right thalamus, extending towards the right middle cerebral peduncle, suspicious for a small acute ischemic infarct (series 4, image 20). No findings on T2 weighted sequence or ADC map to suggest that this is T2 shine through. No associated hemorrhage or significant mass effect. No other acute ischemic infarct.  Craniocervical junction within normal limits. Pituitary gland normal. No acute abnormality seen about the orbits. Paranasal sinuses clear. Mastoid no mastoid effusion.  Bone marrow signal intensity normal. Mild degenerative changes noted within the upper cervical spine. Scalp soft tissues within normal limits.  MRA HEAD FINDINGS  ANTERIOR CIRCULATION:  Visualized distal cervical segments of the internal carotid arteries are widely patent with antegrade flow. The petrous segments are widely patent. There is mild multi focal atherosclerotic irregularity within the cavernous carotid arteries bilaterally without flow-limiting stenosis. These changes are slightly worse on the left. Supraclinoid segments widely patent. A1 segments, anterior communicating artery common anterior  cerebral artery is well opacified.  M1 segments widely patent without occlusion or stenosis. MCA bifurcation is normal. Atheromatous irregularity present within the distal MCA branches bilaterally.  POSTERIOR CIRCULATION:  Vertebral arteries somewhat diminutive but patent bilaterally to the vertebrobasilar junction. Posterior inferior cerebellar arteries are patent proximal Ing, not well evaluated distally. Mild multi focal atherosclerotic irregularity present throughout the basilar artery with superimposed mild stenosis within the mid basilar artery (series 505, image 12). Basilar artery itself is diminutive. Anterior inferior cerebral arteries are patent proximally. Superior cerebellar arteries patent. There is fetal origin  of the posterior cerebral arteries bilaterally with widely patent posterior communicating arteries. Mild distal branch irregularity present within the PCA arteries bilaterally. Distal right PCA not well visualized, and may be partially occluded.  No aneurysm or vascular malformation.  IMPRESSION: MRI HEAD IMPRESSION:  1. 5 mm focus of restricted diffusion within the inferior right thalamus as above, highly suspicious for a small acute ischemic infarct. No associated hemorrhage or mass effect. 2. No other acute intracranial process. 3. Remote lacunar infarcts within the right thalamus and pons. 4. Generalized cerebral atrophy with moderate chronic microvascular ischemic disease.  MRA HEAD IMPRESSION:  1. No proximal branch occlusion or hemodynamically significant stenosis within the intracranial circulation. 2. Fetal origin of the PCAs bilaterally with diminutive vertebrobasilar system. There is mild atherosclerotic stenosis within the mid basilar artery. 3. Distal branch atheromatous irregularity within the MCA and PCA branches bilaterally.   Electronically Signed   By: Jeannine Boga M.D.   On: 10/21/2014 22:48    Scheduled Meds: . amLODipine  5 mg Oral Daily  . aspirin  300 mg Rectal Daily   Or  . aspirin  325 mg Oral Daily  . atorvastatin  20 mg Oral q1800  . heparin  5,000 Units Subcutaneous 3 times per day  . hydrALAZINE  50 mg Oral 3 times per day  . Influenza vac split quadrivalent PF  0.5 mL Intramuscular Tomorrow-1000  . insulin aspart  0-5 Units Subcutaneous QHS  . insulin aspart  0-9 Units Subcutaneous TID WC  . insulin detemir  15 Units Subcutaneous Daily  . levETIRAcetam  500 mg Oral BID  . metoprolol tartrate  50 mg Oral BID  . pneumococcal 23 valent vaccine  0.5 mL Intramuscular Tomorrow-1000  . potassium chloride  40 mEq Oral BID  . vancomycin  1,000 mg Intravenous Q24H   Continuous Infusions: . sodium chloride 20 mL/hr at 10/24/14 1129  . 0.9 % sodium chloride with kcl  75 mL/hr at 10/23/14 1426  . 0.9 % sodium chloride with kcl      Principal Problem:   Aphasia Active Problems:   Uncontrolled diabetes mellitus type 2 without complications   HYPERTENSION, BENIGN SYSTEMIC   Cerebral thrombosis with cerebral infarction   Convulsion   Cerebral infarction due to unspecified mechanism   Encephalopathy   Gram-positive cocci bacteremia    Time spent: 40 minutes   [facility] Pager [phone]. If 7PM-7AM, please contact night-coverage at [email or web address], password [facility] 10/24/2014, 12:27 PM  LOS: 3 days

## 2014-10-24 NOTE — Clinical Social Work Psychosocial (Signed)
Clinical Social Work Department BRIEF PSYCHOSOCIAL ASSESSMENT 10/24/2014  Patient:  April [name], April [name]     Account Number:  [id number]     Admit date:  10/21/2014  Clinical Social Worker:  Glendon Axe, CLINICAL SOCIAL WORKER  Date/Time:  10/24/2014 01:57 PM  Referred by:  Physician  Date Referred:  10/24/2014 Referred for  SNF Placement   Other Referral:   Interview type:  Other - See comment Other interview type:   CSW spoke with pt's niece, April [name] via telephone.    PSYCHOSOCIAL DATA Living Status:  ALONE Admitted from facility:  n/a Level of care:  n/a Primary support name:  April [name] Primary support relationship to patient:  FAMILY Degree of support available:   Strong    CURRENT CONCERNS Current Concerns  Post-Acute Placement   Other Concerns:    SOCIAL WORK ASSESSMENT / PLAN CSW spoke with pt's niece, April [name] at length in reference to post-acute placement for SNF. CSW introduced CSW role and SNF process. Pt's niece reported pt's family is interested in placement at [facility] did communicate that [facility] is not in contract with [facility]. Pt's niece stated family would not be able to pay privately and would need to choose a facility that is contracted with insurance. Pt's niece chooses bed at [facility]. CSW notified [facility] of bed acceptance and to start [employer] authorization this morning. Pt's niece asked several questions in reference to co-pays and obtaining HCPOA. Pt's niece reported she has contacted pt's attorney to complete HCPOA documents. CSW advised pt's niece to contact facility with questions regarding co-pays. CSW will continue to follow pt and pt's family for continued support and to facilitate pt's discharge needs once medically stable.   Assessment/plan status:  Psychosocial Support/Ongoing Assessment of Needs Other assessment/ plan:   FL-2 on chart for MD signature.   Information/referral to community resources:   SNF information.     PATIENT'S/FAMILY'S RESPONSE TO PLAN OF CARE: Pt lying in bed. Pt and pt's family agreeable to short-term SNF placement. Pt's niece requesting to meet with pt's MD to gain insight/ summary for pt's stroke work up. CSW to inform MD. Pt's niece pleasant and appreciated social work intervention.     Glendon Axe, MSW, LCSWA [phone] 10/24/2014 2:15 PM

## 2014-10-24 NOTE — Progress Notes (Signed)
Speech Language Pathology    Patient at procedure (TEE). Will follow-up tomorrow for SLE.      [name], April 10/24/2014, 2:40 PM

## 2014-10-24 NOTE — CV Procedure (Signed)
     Transesophageal Echocardiogram Note  SHINIKA ESTELLE [id number] 1941-03-21  Procedure: Transesophageal Echocardiogram Indications: stroke  Procedure Details Consent: Obtained Time Out: Verified patient identification, verified procedure, site/side was marked, verified correct patient position, special equipment/implants available, Radiology Safety Procedures followed,  medications/allergies/relevent history reviewed, required imaging and test results available.  Performed  Medications: Fentanyl: 50 mcg  Versed: 4 mg  Left ventricle: The cavity size was normal. Systolic function was normal. The estimated ejection fraction was in the range of 55% to 60%. Wall motion was normal; there were no regional wall motion abnormalities.  - Aortic valve: Trileaflet; mildly thickenedleaflets. There was no regurgitation. - Aortic root: The aortic root was normal in size. - Mitral valve: Calcified annulus. There was mild regurgitation. - Right ventricle: The cavity size was normal. Wall thickness was normal. Systolic function was normal. - Right atrium: The atrium was normal in size. - Tricuspid valve: Structurally normal valve. There was trivial regurgitation. - Pulmonic valve: Structurally normal valve. Trivial PR.  - LAA: no thrombus - Aorta: Moderate non-mobile plague. - Interatrial septum: No ASD or PFO, normal bubble study.  No intracardiac source of embolism was found.  Complications: No apparent complications Patient did tolerate procedure well.  Dorothy Spark, MD, [facility] 10/24/2014, 10:29 AM

## 2014-10-24 NOTE — Progress Notes (Signed)
Dr. Meda Coffee notified of patients left frontal headache post TEE. She rated it 10/10 and throbbing in nature. Hycet ordered and administered. Pt also HTN with Bp 205/90. Hydralazine 10 mg ordered and given. Patient repositioned in bed. SmondaYRn

## 2014-10-24 NOTE — Progress Notes (Signed)
Inpatient Diabetes Program Recommendations  AACE/ADA: New Consensus Statement on Inpatient Glycemic Control (2013)  Target Ranges:  Prepandial:   less than 140 mg/dL      Peak postprandial:   less than 180 mg/dL (1-2 hours)      Critically ill patients:  140 - 180 mg/dL   Results for GIANNAMARIE, PAULUS (MRN [id number]) as of 10/24/2014 10:08  Ref. Range 10/23/2014 10:34 10/23/2014 13:00 10/23/2014 13:57 10/23/2014 16:06 10/23/2014 22:36 10/24/2014 06:37 10/24/2014 07:52  Glucose-Capillary Latest Range: 70-99 mg/dL 320 (H)   294 (H) 264 (H) 229 (H)     Reason for assessment: elevated CBG  Diabetes history: Type 2- A1C 10.1% Outpatient Diabetes medications: Januvia 100mg /day, Metformin 1000mg  bid Current orders for Inpatient glycemic control: Levemir 15 units per day, Novolog sensitive correction 0-9 units tid and 0-5 units qhs.  CBG 201-320mg /dl on 10/23/14- please consider increasing Levemir to 20 units q day and increasing Novolog to moderate correction 0-15 units tid and 0-5 units qhs.  Gentry Fitz, RN, BA, MHA, CDE Diabetes Coordinator Inpatient Diabetes Program  [phone] (Team Pager) [phone] [facility]) 10/24/2014 10:11 AM

## 2014-10-24 NOTE — Progress Notes (Signed)
STROKE TEAM PROGRESS NOTE   HISTORY April [name] is a 75 y.o. female history of HTN, DM presenting for evaluation of altered mental status. Patient brought in by neighbor who noted patient was acting differently today. Having trouble getting her words out, speech didn't always make sense and she appeared to be having difficulty walking. She noted that her car had not been moved in 3 days. LKW unknown.  CT head imaging reviewed. Shows no acute process. Patient with leukocytosis of 12.4 in ED. BP elevated with SBP in the 180s adn 190s.   tPA Given: no, outside tPA window   SUBJECTIVE (INTERVAL HISTORY) Family at bedside. Pt more talkative today, but still confused. Unable to name family members. Knows L hand, cannot look to the left. Concern for ongoing seizures.   OBJECTIVE Temp:  [97.7 F (36.5 C)-99.4 F (37.4 C)] 98 F (36.7 C) (03/08 0933) Pulse Rate:  [76-87] 84 (03/08 0933) Cardiac Rhythm:  [-] Normal sinus rhythm (03/08 0800) Resp:  [14-18] 16 (03/08 0933) BP: (149-189)/(72-93) 189/89 mmHg (03/08 0933) SpO2:  [94 %-99 %] 94 % (03/08 0933)   Recent Labs Lab 10/23/14 0651 10/23/14 1034 10/23/14 1606 10/23/14 2236 10/24/14 0637  GLUCAP 207* 320* 294* 264* 229*    Recent Labs Lab 10/21/14 1625 10/22/14 1337 10/23/14 0737 10/24/14 0752  NA 137 139 140 140  K 2.8* 2.6* 2.9* 4.4  CL 90* 94* 101 106  CO2 34* 36* 32 28  GLUCOSE 364* 177* 236* 234*  BUN 14 14 9  5*  CREATININE 0.64 0.58 0.46* 0.47*  CALCIUM 9.5 8.9 8.4 8.7  MG  --  1.9  --   --     Recent Labs Lab 10/21/14 1625 10/22/14 1337 10/23/14 0737  AST 17 18 17   ALT 14 13 14   ALKPHOS 68 67 61  BILITOT 1.0 0.9 0.7  PROT 7.9 7.2 6.2  ALBUMIN 3.7 3.4* 2.8*    Recent Labs Lab 10/21/14 1625 10/23/14 0737 10/24/14 0752  WBC 12.4* 7.8 11.0*  NEUTROABS 7.2  --   --   HGB 15.6* 13.7 13.0  HCT 44.1 39.1 38.0  MCV 90.4 91.1 92.0  PLT 439* 406* 406*    Recent Labs Lab 10/22/14 1337  10/22/14 1810 10/23/14 0059  TROPONINI <0.03 <0.03 <0.03    Recent Labs  10/21/14 1625  LABPROT 13.6  INR 1.03    Recent Labs  10/22/14 1311  COLORURINE AMBER*  LABSPEC 1.026  PHURINE 6.0  GLUCOSEU >1000*  HGBUR NEGATIVE  BILIRUBINUR SMALL*  KETONESUR 40*  PROTEINUR 30*  UROBILINOGEN 1.0  NITRITE NEGATIVE  LEUKOCYTESUR NEGATIVE       Component Value Date/Time   CHOL 169 10/22/2014 0549   TRIG 178* 10/22/2014 0549   HDL 28* 10/22/2014 0549   CHOLHDL 6.0 10/22/2014 0549   VLDL 36 10/22/2014 0549   LDLCALC 105* 10/22/2014 0549   Lab Results  Component Value Date   HGBA1C 10.1* 10/22/2014      Component Value Date/Time   LABOPIA NONE DETECTED 10/22/2014 1311   COCAINSCRNUR NONE DETECTED 10/22/2014 1311   LABBENZ NONE DETECTED 10/22/2014 1311   AMPHETMU NONE DETECTED 10/22/2014 1311   THCU NONE DETECTED 10/22/2014 1311   LABBARB NONE DETECTED 10/22/2014 1311    No results for input(s): ETH in the last 168 hours.  Ct Head Wo Contrast 10/21/2014    No evidence of acute intracranial abnormality.  Moderate to severe chronic small-vessel white matter ischemic changes.     MRI HEAD  10/21/2014    1. 5 mm focus of restricted diffusion within the inferior right thalamus as above, highly suspicious for a small acute ischemic infarct. No associated hemorrhage or mass effect.  2. No other acute intracranial process.  3. Remote lacunar infarcts within the right thalamus and pons.  4. Generalized cerebral atrophy with moderate chronic microvascular ischemic disease.    MRA HEAD   10/21/2014    1. No proximal branch occlusion or hemodynamically significant stenosis within the intracranial circulation.  2. Fetal origin of the PCAs bilaterally with diminutive vertebrobasilar system. There is mild atherosclerotic stenosis within the mid basilar artery.  3. Distal branch atheromatous irregularity within the MCA and PCA branches bilaterally.     Carotid Doppler  There is  1-39% bilateral ICA stenosis. Vertebral artery flow is antegrade.   2D Echocardiogram  EF 55-60% with no source of embolus.   EEG  10/24/2014  10/23/2014  This is an abnormal electroencephalogram secondary to three electrographic seizures noted during the recording without clinical change. 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    PHYSICAL EXAM Frail elderly Caucasian lady not in distress. . Afebrile. Head is nontraumatic. Neck is supple without bruit.    Cardiac exam no murmur or gallop. Lungs are clear to auscultation. Distal pulses are well felt. Neurological Exam : Awake alert disoriented speech is nonfluent but clear. Able to name herself and her family member at the bedside has intermittent transient right gaze deviation and after great effort is able to cross midline and look to the left. .Follows only simple midline and one-step commands. Diminished attention, registration and recall. Blinks to threat bilaterally. Fundi could not be visualized. Face is symmetric without weakness. Tongue is midline. Motor system exam reveals no focal weakness and is also extremity well against gravity. Deep tendon pulses are symmetric. Plantars are downgoing. Gait was not tested.   ASSESSMENT/PLAN April [name] is a 74 y.o. female with a history of diabetes mellitus and hypertension presenting with altered mental status, speech difficulties, and gait disturbance. She did not receive IV t-PA due to late presentation.  Possible Gram positive sepsis  Low grade fever, Mild leukocytosis  UA unrevealing. Final culture pending   chest x-ray with bronchitis  ID following, started on Vancomycin  ? Contaminant. 1 or 2 BC +. Await Cx pathology  TEE scheduled for tomorrow to rule out endocarditis  Seizure  Altered mental status out of proportion to patient's infarct. Confusion likely secondary to unwitnessed seizure. Should improve once medication starts  EEG with 3 episodes of  seizure  Improved but still confused. Cannot look to the left. Concern for ongoing seizures  Will increase keppra to 500 bid (loaded w/ 500 followed by 250 bid)  Repeat EEG  Stroke:  Incidental non-dominant  infarct of the  inferior right thalamus likely secondary to small vessel disease. It cannot explain her presenting confusion.  MRI  tiny right thalamic/middle cerebral peduncle lacune which cannot explain her profound confusion.  Resultant  Altered mental status,confusion, delirium  Repeat MRI with and without contrast  Carotid Doppler  1-39% ICA stenosis. Vertebral artery flow is antegrade.  2D Echo  No source of embolus   TEE pending   UDS neg  Subcutaneous heparin for VTE prophylaxis  Diet NPO time specified for TEE   no antithrombotic prior to admission, now on aspirin 325 mg orally every day  Ongoing aggressive stroke risk factor management  Therapy recommendations: CIR  Disposition:  CIR  Hypertension  Home meds:   none  Stable  Hyperlipidemia  Home meds:  none   LDL 105, goal < 70  Now on Lipitor 20 mg daily  Continue statin at discharge  Diabetes  HgbA1c 10.1, goal < 7.0  Uncontrolled  Other Stroke Risk Factors  Advanced age  Other Active Problems  Hypokalemia 2.8. Replace K  Urinary retention, cath placed  Social worker consult  Hospital day # [place] for Pager information 10/24/2014 10:26 AM  I have personally examined this patient, reviewed notes, independently viewed imaging studies, participated in medical decision making and plan of care. I have made any additions or clarifications directly to the above note. Agree with note above. I'm concerned that she is still having some subclinical seizures as she is had intermittent right eye deviation during my exam. Hence we will increase the dose of Keppra and check another EEG as well as MRI scan of the brain with contrast. Discussed with  family member at the bedside and answered questions. Antony Contras, MD Medical Director [facility] Pager: [phone] 10/24/2014 1:15 PM   To contact Stroke Continuity provider, please refer to [email or web address] After hours, contact General Neurology

## 2014-10-24 NOTE — Progress Notes (Signed)
  Echocardiogram Echocardiogram Transesophageal has been performed.  April [name] 10/24/2014, 12:54 PM

## 2014-10-24 NOTE — Progress Notes (Deleted)
Spoke to Judson Roch and she stated that patient had complications form TEE and patient couldn't have EEG at moment. EEG will be performed tomorrow 10/25/14

## 2014-10-24 NOTE — Clinical Social Work Note (Signed)
Clinical Social Worker has assessed patient and pt's family and presented bed offers to pt's niece, [name]. Full psychosocial assessment to follow.   FL-2 on chart for MD signature.   Glendon Axe, MSW, LCSWA [phone] 10/24/2014 11:01 AM

## 2014-10-24 NOTE — Progress Notes (Addendum)
[facility] for Infectious Disease    Date of Admission:  10/21/2014   Total days of antibiotics 2        Day 2 vanco           ID: April [name] is a 74 y.o. female with CVA and seizure Principal Problem:   Aphasia Active Problems:   Uncontrolled diabetes mellitus type 2 without complications   HYPERTENSION, BENIGN SYSTEMIC   Cerebral thrombosis with cerebral infarction   Convulsion   Cerebral infarction due to unspecified mechanism   Encephalopathy   Gram-positive cocci bacteremia   Seizure disorder, status epilepticus, nonconvulsive    Subjective: Afebrile, underwent TEE today, having post procedure headache. Her friend reports that the patient is able to say more words but still having word finding difficulty. Had episode of eyes fluttering concerning for recurrent seizure  Medications:  . amLODipine  5 mg Oral Daily  . aspirin  300 mg Rectal Daily   Or  . aspirin  325 mg Oral Daily  . atorvastatin  20 mg Oral q1800  . heparin  5,000 Units Subcutaneous 3 times per day  . hydrALAZINE  50 mg Oral 3 times per day  . HYDROcodone-acetaminophen      . Influenza vac split quadrivalent PF  0.5 mL Intramuscular Tomorrow-1000  . insulin aspart  0-5 Units Subcutaneous QHS  . insulin aspart  0-9 Units Subcutaneous TID WC  . insulin detemir  25 Units Subcutaneous Daily  . levETIRAcetam  500 mg Oral BID  . metoprolol tartrate  50 mg Oral BID  . pneumococcal 23 valent vaccine  0.5 mL Intramuscular Tomorrow-1000  . potassium chloride  40 mEq Oral BID    Objective: Vital signs in last 24 hours: Temp:  [97.7 F (36.5 C)-99.4 F (37.4 C)] 98.6 F (37 C) (03/08 1302) Pulse Rate:  [45-107] 105 (03/08 1420) Resp:  [12-25] 17 (03/08 1420) BP: (149-229)/(69-102) 190/91 mmHg (03/08 1420) SpO2:  [91 %-99 %] 94 % (03/08 1420)  Physical Exam  Constitutional:  oriented to person. appears well-developed and well-nourished. No distress.  HENT:  Mouth/Throat: Oropharynx is  clear and moist. No oropharyngeal exudate.  Cardiovascular: Normal rate, regular rhythm and normal heart sounds. Exam reveals no gallop and no friction rub.  No murmur heard.  Pulmonary/Chest: Effort normal and breath sounds normal. No respiratory distress.  has no wheezes.  Abdominal: Soft. Bowel sounds are normal.  exhibits no distension. There is no tenderness.  Lymphadenopathy: no cervical adenopathy.  Neurological: alert and oriented to person, place, and time.  Skin: Skin is warm and dry. No rash noted. No erythema.   Lab Results  Recent Labs  10/23/14 0737 10/24/14 0752  WBC 7.8 11.0*  HGB 13.7 13.0  HCT 39.1 38.0  NA 140 140  K 2.9* 4.4  CL 101 106  CO2 32 28  BUN 9 5*  CREATININE 0.46* 0.47*   Liver Panel  Recent Labs  10/22/14 1337 10/23/14 0737  PROT 7.2 6.2  ALBUMIN 3.4* 2.8*  AST 18 17  ALT 13 14  ALKPHOS 67 61  BILITOT 0.9 0.7   Studies/Results: Dg Chest 2 View  10/22/2014   CLINICAL DATA:  Initial evaluation Fever and altered mental status.H/o diabetes and HTN ppm  EXAM: CHEST  2 VIEW  COMPARISON:  None.  FINDINGS: Mild cardiac enlargement. Uncoiling of the aorta. Vascular pattern normal. Mild bilateral bronchitic change. Mild diffuse interstitial prominence. No consolidation or effusion.  IMPRESSION: Bronchitic change with diffuse  interstitial prominence most suggestive of chronic mild interstitial lung disease. Atypical bilateral pneumonia considered less likely.   Electronically Signed   By: Skipper Cliche M.D.   On: 10/22/2014 18:15     Assessment/Plan: Bacteremia = 1 of 2 blood cx CoNS, in setting of no fevers or leukocytosis. This is likely a skin contaminant. Recommend to discontinue vancomycin and observe for any other symptoms suggestive of infection.  Seizure = appears that may have potential break through on loading dose of keppra, defer to Dr. Leonie Man in management and further work up of seizures  Headache = getting prn meds for management of  headache  Will sign off.  Baxter Flattery [facility] for Infectious Diseases Cell: [phone] Pager: [phone]  10/24/2014, 2:34 PM

## 2014-10-24 NOTE — H&P (View-Only) (Signed)
[facility]  Total days of antibiotics 1        Day 1 vanco               Reason for Consult: bacteremia    Referring Physician: abrol  Principal Problem:   Aphasia Active Problems:   Uncontrolled diabetes mellitus type 2 without complications   HYPERTENSION, BENIGN SYSTEMIC   Cerebral thrombosis with cerebral infarction   Convulsion    HPI: April [name] is a 74 y.o. female with past med hx of DM, HTN, with no prior hx of being hospitalized who was found to have word finding difficulties possibly for > 1 day brought into the ED for evaluation. She lives by herself, has 2 dogs. Neighbors reported that she didn't move her car for 3 days. She denies any fever, chills, but still has word finding difficulties. She was admitted on 3/05 for stroke work up.s he had mild leukocytosis of 12K, initial Head Ct was nonrevealing. She did undergo MRI revealed  5 mm focus of restricted diffusion within the inferior right thalamus as above, highly suspicious for a small acute ischemic infarct.  Remote lacunar infarcts within the right thalamus and pons.   Generalized cerebral atrophy with moderate chronic microvascular ischemic disease. 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 She has been placed on anti-epileptics this afternoon, and she has started to have improvement with both expressive and receptive speech. She remains afebrile. 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  Past Medical History  Diagnosis Date  . Diabetes mellitus   . Hypertension   . Retinopathy 06/06/2013    Per eye exam 05/31/13 eye exam, Moderate retinopathy     Allergies:  Allergies  Allergen Reactions  . Codeine Nausea And Vomiting  . Sulfa Antibiotics Nausea And Vomiting    MEDICATIONS: . amLODipine  5 mg Oral Daily  . aspirin  300 mg Rectal Daily   Or  . aspirin  325 mg Oral Daily  . atorvastatin   20 mg Oral q1800  . heparin  5,000 Units Subcutaneous 3 times per day  . Influenza vac split quadrivalent PF  0.5 mL Intramuscular Tomorrow-1000  . insulin aspart  0-5 Units Subcutaneous QHS  . insulin aspart  0-9 Units Subcutaneous TID WC  . insulin detemir  15 Units Subcutaneous Daily  . levETIRAcetam  250 mg Oral BID  . metoprolol tartrate  12.5 mg Oral BID  . pneumococcal 23 valent vaccine  0.5 mL Intramuscular Tomorrow-1000  . potassium chloride  40 mEq Oral BID  . vancomycin  1,000 mg Intravenous Q24H    History  Substance Use Topics  . Smoking status: Never Smoker   . Smokeless tobacco: Never Used  . Alcohol Use: No    Family History  Problem Relation Age of Onset  . Arthritis Maternal Grandmother   . Heart disease Maternal Grandmother 80     Review of Systems  Constitutional: Negative for fever, chills, diaphoresis, activity change, appetite change, fatigue and unexpected weight change.  HENT: Negative for congestion, sore throat, rhinorrhea, sneezing, trouble swallowing and sinus pressure.  Eyes: Negative for photophobia and visual disturbance.  Respiratory: Negative for cough, chest tightness, shortness of breath, wheezing and stridor.  Cardiovascular: Negative for chest pain, palpitations and leg swelling.  Gastrointestinal: Negative for nausea, vomiting, abdominal pain, diarrhea, constipation, blood in stool, abdominal distention and anal bleeding.  Genitourinary: Negative for dysuria, hematuria, flank pain and difficulty urinating.  Musculoskeletal: Negative for myalgias, back pain, joint swelling, arthralgias and gait problem.  Skin: Negative for color change, pallor, rash and wound.  Neurological: difficulty with word finding Hematological: Negative for adenopathy. Does not bruise/bleed easily.  Psychiatric/Behavioral: Negative for behavioral problems, confusion, sleep disturbance, dysphoric mood, decreased concentration and agitation.     OBJECTIVE: Temp:   [98.1 F (36.7 C)-99.1 F (37.3 C)] 98.8 F (37.1 C) (03/07 1333) Pulse Rate:  [80-93] 86 (03/07 1333) Resp:  [14-20] 18 (03/07 1333) BP: (137-196)/(64-88) 174/83 mmHg (03/07 1333) SpO2:  [92 %-100 %] 96 % (03/07 1333) Physical Exam  Constitutional:  oriented to person, place, and time. appears well-developed and well-nourished. No distress.  HENT:  Mouth/Throat: Oropharynx is clear and moist. No oropharyngeal exudate.  Cardiovascular: Normal rate, regular rhythm and normal heart sounds. Exam reveals no gallop and no friction rub.  No murmur heard.  Pulmonary/Chest: Effort normal and breath sounds normal. No respiratory distress.  has no wheezes.  Abdominal: Soft. Bowel sounds are normal.  exhibits no distension. There is no tenderness.  Lymphadenopathy: no cervical adenopathy.  Neurological: alert and oriented to person, place. She repeats herself in an attempt to find correct words to answer questions. No focal weakness. CN2-12 intact. 5/5 motor in all extremities. Skin: Skin is warm and dry. No rash noted. No erythema.  Psychiatric: a normal mood and affect.  behavior is normal.    LABS: Results for orders placed or performed during the hospital encounter of 10/21/14 (from the past 48 hour(s))  Glucose, capillary     Status: Abnormal   Collection Time: 10/22/14  4:24 AM  Result Value Ref Range   Glucose-Capillary 176 (H) 70 - 99 mg/dL   Comment 1 Notify RN    Comment 2 Documented in Char   Hemoglobin A1c     Status: Abnormal   Collection Time: 10/22/14  5:49 AM  Result Value Ref Range   Hgb A1c MFr Bld 10.1 (H) 4.8 - 5.6 %    Comment: (NOTE)         Pre-diabetes: 5.7 - 6.4         Diabetes: >6.4         Glycemic control for adults with diabetes: <7.0    Mean Plasma Glucose 243 mg/dL    Comment: (NOTE) Performed At: [facility] [place], [place] [id number] Lindon Romp MD WU:[id number]   Lipid panel     Status: Abnormal   Collection Time:  10/22/14  5:49 AM  Result Value Ref Range   Cholesterol 169 0 - 200 mg/dL   Triglycerides 178 (H) <150 mg/dL   HDL 28 (L) >39 mg/dL   Total CHOL/HDL Ratio 6.0 RATIO   VLDL 36 0 - 40 mg/dL   LDL Cholesterol 105 (H) 0 - 99 mg/dL    Comment:        Total Cholesterol/HDL:CHD Risk Coronary Heart Disease Risk Table                     Men   Women  1/2 Average Risk   3.4   3.3  Average Risk       5.0   4.4  2 X Average Risk   9.6   7.1  3 X Average Risk  23.4   11.0        Use the calculated Patient Ratio above and the CHD Risk Table to determine  the patient's CHD Risk.        ATP III CLASSIFICATION (LDL):  <100     mg/dL   Optimal  100-129  mg/dL   Near or Above                    Optimal  130-159  mg/dL   Borderline  160-189  mg/dL   High  >190     mg/dL   Very High   Glucose, capillary     Status: Abnormal   Collection Time: 10/22/14  8:10 AM  Result Value Ref Range   Glucose-Capillary 164 (H) 70 - 99 mg/dL  Glucose, capillary     Status: Abnormal   Collection Time: 10/22/14 11:48 AM  Result Value Ref Range   Glucose-Capillary 173 (H) 70 - 99 mg/dL  Urinalysis, Routine w reflex microscopic     Status: Abnormal   Collection Time: 10/22/14  1:11 PM  Result Value Ref Range   Color, Urine AMBER (A) YELLOW    Comment: BIOCHEMICALS MAY BE AFFECTED BY COLOR   APPearance CLEAR CLEAR   Specific Gravity, Urine 1.026 1.005 - 1.030   pH 6.0 5.0 - 8.0   Glucose, UA >1000 (A) NEGATIVE mg/dL   Hgb urine dipstick NEGATIVE NEGATIVE   Bilirubin Urine SMALL (A) NEGATIVE   Ketones, ur 40 (A) NEGATIVE mg/dL   Protein, ur 30 (A) NEGATIVE mg/dL   Urobilinogen, UA 1.0 0.0 - 1.0 mg/dL   Nitrite NEGATIVE NEGATIVE   Leukocytes, UA NEGATIVE NEGATIVE  Urine rapid drug screen (hosp performed)     Status: None   Collection Time: 10/22/14  1:11 PM  Result Value Ref Range   Opiates NONE DETECTED NONE DETECTED   Cocaine NONE DETECTED NONE DETECTED   Benzodiazepines NONE DETECTED NONE DETECTED    Amphetamines NONE DETECTED NONE DETECTED   Tetrahydrocannabinol NONE DETECTED NONE DETECTED   Barbiturates NONE DETECTED NONE DETECTED    Comment:        DRUG SCREEN FOR MEDICAL PURPOSES ONLY.  IF CONFIRMATION IS NEEDED FOR ANY PURPOSE, NOTIFY LAB WITHIN 5 DAYS.        LOWEST DETECTABLE LIMITS FOR URINE DRUG SCREEN Drug Class       Cutoff (ng/mL) Amphetamine      1000 Barbiturate      200 Benzodiazepine   732 Tricyclics       202 Opiates          300 Cocaine          300 THC              50   Urine microscopic-add on     Status: Abnormal   Collection Time: 10/22/14  1:11 PM  Result Value Ref Range   WBC, UA 0-2 <3 WBC/hpf   RBC / HPF 0-2 <3 RBC/hpf   Casts HYALINE CASTS (A) NEGATIVE  Vitamin B12     Status: None   Collection Time: 10/22/14  1:37 PM  Result Value Ref Range   Vitamin B-12 605 211 - 911 pg/mL    Comment: Performed at [facility]  TSH     Status: None   Collection Time: 10/22/14  1:37 PM  Result Value Ref Range   TSH 1.533 0.350 - 4.500 uIU/mL  Magnesium     Status: None   Collection Time: 10/22/14  1:37 PM  Result Value Ref Range   Magnesium 1.9 1.5 - 2.5 mg/dL  Troponin I     Status:  None   Collection Time: 10/22/14  1:37 PM  Result Value Ref Range   Troponin I <0.03 <0.031 ng/mL    Comment:        NO INDICATION OF MYOCARDIAL INJURY.   Comprehensive metabolic panel     Status: Abnormal   Collection Time: 10/22/14  1:37 PM  Result Value Ref Range   Sodium 139 135 - 145 mmol/L   Potassium 2.6 (LL) 3.5 - 5.1 mmol/L    Comment: REPEATED TO VERIFY CRITICAL RESULT CALLED TO, READ BACK BY AND VERIFIED WITH: Howard Pouch [id number] 10/22/14 D BRADLEY    Chloride 94 (L) 96 - 112 mmol/L   CO2 36 (H) 19 - 32 mmol/L   Glucose, Bld 177 (H) 70 - 99 mg/dL   BUN 14 6 - 23 mg/dL   Creatinine, Ser 0.58 0.50 - 1.10 mg/dL   Calcium 8.9 8.4 - 10.5 mg/dL   Total Protein 7.2 6.0 - 8.3 g/dL   Albumin 3.4 (L) 3.5 - 5.2 g/dL   AST 18 0 - 37 U/L   ALT 13 0 - 35  U/L   Alkaline Phosphatase 67 39 - 117 U/L   Total Bilirubin 0.9 0.3 - 1.2 mg/dL   GFR calc non Af Amer 89 (L) >90 mL/min   GFR calc Af Amer >90 >90 mL/min    Comment: (NOTE) The eGFR has been calculated using the CKD EPI equation. This calculation has not been validated in all clinical situations. eGFR's persistently <90 mL/min signify possible Chronic Kidney Disease.    Anion gap 9 5 - 15  Culture, blood (routine x 2)     Status: None (Preliminary result)   Collection Time: 10/22/14  1:47 PM  Result Value Ref Range   Specimen Description BLOOD LEFT ARM    Special Requests BOTTLES DRAWN AEROBIC ONLY 3 CC    Culture      GRAM POSITIVE COCCI IN CLUSTERS Note: Gram Stain Report Called to,Read Back By and Verified With: JAMIE WHITE 10/23/14 1307 BY SMITHERSJ Performed at [facility]    Report Status PENDING   Lactic acid, plasma     Status: None   Collection Time: 10/22/14  1:47 PM  Result Value Ref Range   Lactic Acid, Venous 0.9 0.5 - 2.0 mmol/L  Glucose, capillary     Status: Abnormal   Collection Time: 10/22/14  4:49 PM  Result Value Ref Range   Glucose-Capillary 342 (H) 70 - 99 mg/dL  Troponin I     Status: None   Collection Time: 10/22/14  6:10 PM  Result Value Ref Range   Troponin I <0.03 <0.031 ng/mL    Comment:        NO INDICATION OF MYOCARDIAL INJURY.   Glucose, capillary     Status: Abnormal   Collection Time: 10/22/14  9:54 PM  Result Value Ref Range   Glucose-Capillary 203 (H) 70 - 99 mg/dL   Comment 1 Notify RN    Comment 2 Documented in Char   Troponin I     Status: None   Collection Time: 10/23/14 12:59 AM  Result Value Ref Range   Troponin I <0.03 <0.031 ng/mL    Comment:        NO INDICATION OF MYOCARDIAL INJURY.   Glucose, capillary     Status: Abnormal   Collection Time: 10/23/14  6:51 AM  Result Value Ref Range   Glucose-Capillary 207 (H) 70 - 99 mg/dL   Comment 1 Notify RN  Comment 2 Documented in Char   CBC     Status: Abnormal     Collection Time: 10/23/14  7:37 AM  Result Value Ref Range   WBC 7.8 4.0 - 10.5 K/uL   RBC 4.29 3.87 - 5.11 MIL/uL   Hemoglobin 13.7 12.0 - 15.0 g/dL   HCT 39.1 36.0 - 46.0 %   MCV 91.1 78.0 - 100.0 fL   MCH 31.9 26.0 - 34.0 pg   MCHC 35.0 30.0 - 36.0 g/dL   RDW 13.4 11.5 - 15.5 %   Platelets 406 (H) 150 - 400 K/uL  Comprehensive metabolic panel     Status: Abnormal   Collection Time: 10/23/14  7:37 AM  Result Value Ref Range   Sodium 140 135 - 145 mmol/L   Potassium 2.9 (L) 3.5 - 5.1 mmol/L   Chloride 101 96 - 112 mmol/L   CO2 32 19 - 32 mmol/L   Glucose, Bld 236 (H) 70 - 99 mg/dL   BUN 9 6 - 23 mg/dL   Creatinine, Ser 0.46 (L) 0.50 - 1.10 mg/dL   Calcium 8.4 8.4 - 10.5 mg/dL   Total Protein 6.2 6.0 - 8.3 g/dL   Albumin 2.8 (L) 3.5 - 5.2 g/dL   AST 17 0 - 37 U/L   ALT 14 0 - 35 U/L   Alkaline Phosphatase 61 39 - 117 U/L   Total Bilirubin 0.7 0.3 - 1.2 mg/dL   GFR calc non Af Amer >90 >90 mL/min   GFR calc Af Amer >90 >90 mL/min    Comment: (NOTE) The eGFR has been calculated using the CKD EPI equation. This calculation has not been validated in all clinical situations. eGFR's persistently <90 mL/min signify possible Chronic Kidney Disease.    Anion gap 7 5 - 15  Glucose, capillary     Status: Abnormal   Collection Time: 10/23/14 10:34 AM  Result Value Ref Range   Glucose-Capillary 320 (H) 70 - 99 mg/dL   Comment 1 Notify RN   Glucose, capillary     Status: Abnormal   Collection Time: 10/23/14  4:06 PM  Result Value Ref Range   Glucose-Capillary 294 (H) 70 - 99 mg/dL    MICRO: 3/6 urine cx ngtd 3/6 blood cx 1 of 2 sets gpcc IMAGING: Dg Chest 2 View  10/22/2014   CLINICAL DATA:  Initial evaluation Fever and altered mental status.H/o diabetes and HTN ppm  EXAM: CHEST  2 VIEW  COMPARISON:  None.  FINDINGS: Mild cardiac enlargement. Uncoiling of the aorta. Vascular pattern normal. Mild bilateral bronchitic change. Mild diffuse interstitial prominence. No  consolidation or effusion.  IMPRESSION: Bronchitic change with diffuse interstitial prominence most suggestive of chronic mild interstitial lung disease. Atypical bilateral pneumonia considered less likely.   Electronically Signed   By: Skipper Cliche M.D.   On: 10/22/2014 18:15   Ct Head Wo Contrast  10/21/2014   CLINICAL DATA:  Acute aphasia, confusion and altered mental status.  EXAM: CT HEAD WITHOUT CONTRAST  TECHNIQUE: Contiguous axial images were obtained from the base of the skull through the vertex without intravenous contrast.  COMPARISON:  03/03/2010 MRI and 03/02/2010 CT  FINDINGS: Moderate to severe chronic small-vessel white matter ischemic changes are again identified.  No acute intracranial abnormalities are identified, including mass lesion or mass effect, hydrocephalus, extra-axial fluid collection, midline shift, hemorrhage, or acute infarction.  The visualized bony calvarium is unremarkable.  IMPRESSION: No evidence of acute intracranial abnormality.  Moderate to severe chronic small-vessel white  matter ischemic changes.   Electronically Signed   By: Margarette Canada M.D.   On: 10/21/2014 19:53   Mr Brain Wo Contrast  10/21/2014   CLINICAL DATA:  Initial evaluation for acute altered mental status. History of hypertension, diabetes.  EXAM: MRI HEAD WITHOUT CONTRAST  MRA HEAD WITHOUT CONTRAST  TECHNIQUE: Multiplanar, multiecho pulse sequences of the brain and surrounding structures were obtained without intravenous contrast. Angiographic images of the head were obtained using MRA technique without contrast.  COMPARISON:  Prior head CT from earlier the same day.  FINDINGS: MRI HEAD FINDINGS  Diffuse prominence of the CSF containing spaces is compatible with generalized cerebral atrophy. Patchy and confluent T2/FLAIR hyperintensity within the periventricular and deep white matter both cerebral hemispheres noted, most compatible with moderate chronic small vessel ischemic disease.  No mass lesion or  midline shift. Ventricular prominence roller global parenchymal volume loss present without hydrocephalus. No extra-axial fluid collection.  No findings to suggest posterior reversible encephalopathy syndrome.  Small remote lacunar infarct present within the right pons. Additional small remote lacunar infarcts within the right thalamus.  There is a small 5 mm focus of high signal intensity on DWI signal in the inferolateral right thalamus, extending towards the right middle cerebral peduncle, suspicious for a small acute ischemic infarct (series 4, image 20). No findings on T2 weighted sequence or ADC map to suggest that this is T2 shine through. No associated hemorrhage or significant mass effect. No other acute ischemic infarct.  Craniocervical junction within normal limits. Pituitary gland normal. No acute abnormality seen about the orbits. Paranasal sinuses clear. Mastoid no mastoid effusion.  Bone marrow signal intensity normal. Mild degenerative changes noted within the upper cervical spine. Scalp soft tissues within normal limits.  MRA HEAD FINDINGS  ANTERIOR CIRCULATION:  Visualized distal cervical segments of the internal carotid arteries are widely patent with antegrade flow. The petrous segments are widely patent. There is mild multi focal atherosclerotic irregularity within the cavernous carotid arteries bilaterally without flow-limiting stenosis. These changes are slightly worse on the left. Supraclinoid segments widely patent. A1 segments, anterior communicating artery common anterior cerebral artery is well opacified.  M1 segments widely patent without occlusion or stenosis. MCA bifurcation is normal. Atheromatous irregularity present within the distal MCA branches bilaterally.  POSTERIOR CIRCULATION:  Vertebral arteries somewhat diminutive but patent bilaterally to the vertebrobasilar junction. Posterior inferior cerebellar arteries are patent proximal Ing, not well evaluated distally. Mild multi  focal atherosclerotic irregularity present throughout the basilar artery with superimposed mild stenosis within the mid basilar artery (series 505, image 12). Basilar artery itself is diminutive. Anterior inferior cerebral arteries are patent proximally. Superior cerebellar arteries patent. There is fetal origin of the posterior cerebral arteries bilaterally with widely patent posterior communicating arteries. Mild distal branch irregularity present within the PCA arteries bilaterally. Distal right PCA not well visualized, and may be partially occluded.  No aneurysm or vascular malformation.  IMPRESSION: MRI HEAD IMPRESSION:  1. 5 mm focus of restricted diffusion within the inferior right thalamus as above, highly suspicious for a small acute ischemic infarct. No associated hemorrhage or mass effect. 2. No other acute intracranial process. 3. Remote lacunar infarcts within the right thalamus and pons. 4. Generalized cerebral atrophy with moderate chronic microvascular ischemic disease.  MRA HEAD IMPRESSION:  1. No proximal branch occlusion or hemodynamically significant stenosis within the intracranial circulation. 2. Fetal origin of the PCAs bilaterally with diminutive vertebrobasilar system. There is mild atherosclerotic stenosis within the mid basilar artery. 3.  Distal branch atheromatous irregularity within the MCA and PCA branches bilaterally.   Electronically Signed   By: Jeannine Boga M.D.   On: 10/21/2014 22:48   Mr Jodene Nam Head/brain Wo Cm  10/21/2014   CLINICAL DATA:  Initial evaluation for acute altered mental status. History of hypertension, diabetes.  EXAM: MRI HEAD WITHOUT CONTRAST  MRA HEAD WITHOUT CONTRAST  TECHNIQUE: Multiplanar, multiecho pulse sequences of the brain and surrounding structures were obtained without intravenous contrast. Angiographic images of the head were obtained using MRA technique without contrast.  COMPARISON:  Prior head CT from earlier the same day.  FINDINGS: MRI HEAD  FINDINGS  Diffuse prominence of the CSF containing spaces is compatible with generalized cerebral atrophy. Patchy and confluent T2/FLAIR hyperintensity within the periventricular and deep white matter both cerebral hemispheres noted, most compatible with moderate chronic small vessel ischemic disease.  No mass lesion or midline shift. Ventricular prominence roller global parenchymal volume loss present without hydrocephalus. No extra-axial fluid collection.  No findings to suggest posterior reversible encephalopathy syndrome.  Small remote lacunar infarct present within the right pons. Additional small remote lacunar infarcts within the right thalamus.  There is a small 5 mm focus of high signal intensity on DWI signal in the inferolateral right thalamus, extending towards the right middle cerebral peduncle, suspicious for a small acute ischemic infarct (series 4, image 20). No findings on T2 weighted sequence or ADC map to suggest that this is T2 shine through. No associated hemorrhage or significant mass effect. No other acute ischemic infarct.  Craniocervical junction within normal limits. Pituitary gland normal. No acute abnormality seen about the orbits. Paranasal sinuses clear. Mastoid no mastoid effusion.  Bone marrow signal intensity normal. Mild degenerative changes noted within the upper cervical spine. Scalp soft tissues within normal limits.  MRA HEAD FINDINGS  ANTERIOR CIRCULATION:  Visualized distal cervical segments of the internal carotid arteries are widely patent with antegrade flow. The petrous segments are widely patent. There is mild multi focal atherosclerotic irregularity within the cavernous carotid arteries bilaterally without flow-limiting stenosis. These changes are slightly worse on the left. Supraclinoid segments widely patent. A1 segments, anterior communicating artery common anterior cerebral artery is well opacified.  M1 segments widely patent without occlusion or stenosis. MCA  bifurcation is normal. Atheromatous irregularity present within the distal MCA branches bilaterally.  POSTERIOR CIRCULATION:  Vertebral arteries somewhat diminutive but patent bilaterally to the vertebrobasilar junction. Posterior inferior cerebellar arteries are patent proximal Ing, not well evaluated distally. Mild multi focal atherosclerotic irregularity present throughout the basilar artery with superimposed mild stenosis within the mid basilar artery (series 505, image 12). Basilar artery itself is diminutive. Anterior inferior cerebral arteries are patent proximally. Superior cerebellar arteries patent. There is fetal origin of the posterior cerebral arteries bilaterally with widely patent posterior communicating arteries. Mild distal branch irregularity present within the PCA arteries bilaterally. Distal right PCA not well visualized, and may be partially occluded.  No aneurysm or vascular malformation.  IMPRESSION: MRI HEAD IMPRESSION:  1. 5 mm focus of restricted diffusion within the inferior right thalamus as above, highly suspicious for a small acute ischemic infarct. No associated hemorrhage or mass effect. 2. No other acute intracranial process. 3. Remote lacunar infarcts within the right thalamus and pons. 4. Generalized cerebral atrophy with moderate chronic microvascular ischemic disease.  MRA HEAD IMPRESSION:  1. No proximal branch occlusion or hemodynamically significant stenosis within the intracranial circulation. 2. Fetal origin of the PCAs bilaterally with diminutive vertebrobasilar system. There is  mild atherosclerotic stenosis within the mid basilar artery. 3. Distal branch atheromatous irregularity within the MCA and PCA branches bilaterally.   Electronically Signed   By: Jeannine Boga M.D.   On: 10/21/2014 22:48    Assessment/Plan:  74yo F who presents with expressive aphasia, altered mental status found to have possilbe embolic stroke as well as occipital strokes, now started on  anti-epileptics with some improvement. Work up for infection showing 1 of 2 sets blood cx having gram positive cocci in clusters.   - continue with vancomycin - await id of blood cx pathogen - favoring contaminant but will wait til further information returns  - agree with getting TEE to see if she is having embolic stroke - AMS appears improving with anti-epileptic for seizure management.  Elzie [facility] [phone]

## 2014-10-24 NOTE — Interval H&P Note (Signed)
History and Physical Interval Note:  10/24/2014 10:28 AM  April [name]  has presented today for surgery, with the diagnosis of RULE OUT OF VEGATATION  The various methods of treatment have been discussed with the patient and family. After consideration of risks, benefits and other options for treatment, the patient has consented to  Procedure(s): TRANSESOPHAGEAL ECHOCARDIOGRAM (TEE) (N/A) as a surgical intervention .  The patient's history has been reviewed, patient examined, no change in status, stable for surgery.  I have reviewed the patient's chart and labs.  Questions were answered to the patient's satisfaction.     Dorothy Spark

## 2014-10-25 ENCOUNTER — Inpatient Hospital Stay (HOSPITAL_COMMUNITY): Payer: Medicare HMO

## 2014-10-25 ENCOUNTER — Encounter (HOSPITAL_COMMUNITY): Payer: Self-pay | Admitting: Cardiology

## 2014-10-25 DIAGNOSIS — G9340 Encephalopathy, unspecified: Secondary | ICD-10-CM

## 2014-10-25 DIAGNOSIS — E785 Hyperlipidemia, unspecified: Secondary | ICD-10-CM

## 2014-10-25 DIAGNOSIS — I639 Cerebral infarction, unspecified: Secondary | ICD-10-CM

## 2014-10-25 DIAGNOSIS — I6340 Cerebral infarction due to embolism of unspecified cerebral artery: Secondary | ICD-10-CM | POA: Diagnosis not present

## 2014-10-25 LAB — GLUCOSE, CAPILLARY
GLUCOSE-CAPILLARY: 248 mg/dL — AB (ref 70–99)
GLUCOSE-CAPILLARY: 173 mg/dL — AB (ref 70–99)
Glucose-Capillary: 141 mg/dL — ABNORMAL HIGH (ref 70–99)

## 2014-10-25 MED ORDER — ISOSORB DINITRATE-HYDRALAZINE 20-37.5 MG PO TABS
1.0000 | ORAL_TABLET | Freq: Three times a day (TID) | ORAL | Status: DC
  Administered 2014-10-25: 1 via ORAL
  Filled 2014-10-25 (×6): qty 1

## 2014-10-25 MED ORDER — GADOBENATE DIMEGLUMINE 529 MG/ML IV SOLN
10.0000 mL | Freq: Once | INTRAVENOUS | Status: AC | PRN
  Administered 2014-10-25: 10 mL via INTRAVENOUS

## 2014-10-25 MED ORDER — AMLODIPINE BESYLATE 10 MG PO TABS
10.0000 mg | ORAL_TABLET | Freq: Every day | ORAL | Status: DC
  Administered 2014-10-25: 10 mg via ORAL
  Filled 2014-10-25: qty 1

## 2014-10-25 MED ORDER — AMLODIPINE BESYLATE 10 MG PO TABS
10.0000 mg | ORAL_TABLET | Freq: Every day | ORAL | Status: DC
Start: 2014-10-25 — End: 2015-01-18

## 2014-10-25 MED ORDER — ISOSORB DINITRATE-HYDRALAZINE 20-37.5 MG PO TABS: 1.0000 | ORAL_TABLET | Freq: Three times a day (TID) | ORAL | Status: DC

## 2014-10-25 MED ORDER — ASPIRIN EC 325 MG PO TBEC: 325.0000 mg | DELAYED_RELEASE_TABLET | Freq: Every day | ORAL | Status: DC

## 2014-10-25 MED ORDER — LEVETIRACETAM 500 MG PO TABS: 500.0000 mg | ORAL_TABLET | Freq: Two times a day (BID) | ORAL | Status: DC

## 2014-10-25 MED ORDER — GLIMEPIRIDE 2 MG PO TABS: 2.0000 mg | ORAL_TABLET | Freq: Every day | ORAL | Status: DC

## 2014-10-25 MED ORDER — PANTOPRAZOLE SODIUM 40 MG PO TBEC: 40.0000 mg | DELAYED_RELEASE_TABLET | Freq: Every day | ORAL | Status: DC

## 2014-10-25 MED ORDER — ONDANSETRON 8 MG PO TBDP: 8.0000 mg | ORAL_TABLET | Freq: Three times a day (TID) | ORAL | Status: DC | PRN

## 2014-10-25 MED ORDER — CARVEDILOL 12.5 MG PO TABS
25.0000 mg | ORAL_TABLET | Freq: Two times a day (BID) | ORAL | Status: DC
  Administered 2014-10-25 (×2): 25 mg via ORAL
  Filled 2014-10-25 (×2): qty 2

## 2014-10-25 MED ORDER — INSULIN DETEMIR 100 UNIT/ML ~~LOC~~ SOLN: 25.0000 [IU] | Freq: Every day | SUBCUTANEOUS | Status: DC

## 2014-10-25 MED ORDER — CARVEDILOL 25 MG PO TABS: 25.0000 mg | ORAL_TABLET | Freq: Two times a day (BID) | ORAL | Status: DC

## 2014-10-25 MED ORDER — ATORVASTATIN CALCIUM 20 MG PO TABS: 20.0000 mg | ORAL_TABLET | Freq: Every day | ORAL | Status: DC

## 2014-10-25 MED ORDER — POTASSIUM CHLORIDE CRYS ER 20 MEQ PO TBCR: 40.0000 meq | EXTENDED_RELEASE_TABLET | Freq: Every day | ORAL | Status: DC

## 2014-10-25 NOTE — Progress Notes (Signed)
Patient discharged to [facility] via niece's car. Patient was escorted down to car by RN and Chartered certified accountant. Patient denies any pain, neuro assessment unchanged. Tele box removed, CCMD notified, IV removed. RN discussed medications and discharge instructions with patient and niece. CSW envelope and prescriptions given to patient's niece. Patient vocalized s/sx of stroke.

## 2014-10-25 NOTE — Progress Notes (Signed)
STROKE TEAM PROGRESS NOTE   HISTORY April [name] is a 74 y.o. female history of HTN, DM presenting for evaluation of altered mental status. Patient brought in by neighbor who noted patient was acting differently today. Having trouble getting her words out, speech didn't always make sense and she appeared to be having difficulty walking. She noted that her car had not been moved in 3 days. LKW unknown.  CT head imaging reviewed. Shows no acute process. Patient with leukocytosis of 12.4 in ED. BP elevated with SBP in the 180s adn 190s.   tPA Given: no, outside tPA window   SUBJECTIVE (INTERVAL HISTORY) Family at bedside. Pt more talkative today,less confused.  able to name family members.  Repeat EKG shows no ongoing seizure activity and was normal. Repeat MRI scan with contrast shows no abnormal enhancing lesions or any new infarcts TEE yesterday showed no evidence of endocarditis OBJECTIVE Temp:  [98 F (36.7 C)-98.8 F (37.1 C)] 98 F (36.7 C) (03/09 1404) Pulse Rate:  [76-89] 76 (03/09 1404) Cardiac Rhythm:  [-] Normal sinus rhythm (03/09 0800) Resp:  [16-18] 18 (03/09 1404) BP: (100-168)/(44-80) 115/47 mmHg (03/09 1526) SpO2:  [92 %-96 %] 93 % (03/09 1404)   Recent Labs Lab 10/24/14 1613 10/24/14 2212 10/25/14 0646 10/25/14 1301 10/25/14 1649  GLUCAP 247* 192* 141* 248* 173*    Recent Labs Lab 10/21/14 1625 10/22/14 1337 10/23/14 0737 10/24/14 0752  NA 137 139 140 140  K 2.8* 2.6* 2.9* 4.4  CL 90* 94* 101 106  CO2 34* 36* 32 28  GLUCOSE 364* 177* 236* 234*  BUN 14 14 9  5*  CREATININE 0.64 0.58 0.46* 0.47*  CALCIUM 9.5 8.9 8.4 8.7  MG  --  1.9  --   --     Recent Labs Lab 10/21/14 1625 10/22/14 1337 10/23/14 0737  AST 17 18 17   ALT 14 13 14   ALKPHOS 68 67 61  BILITOT 1.0 0.9 0.7  PROT 7.9 7.2 6.2  ALBUMIN 3.7 3.4* 2.8*    Recent Labs Lab 10/21/14 1625 10/23/14 0737 10/24/14 0752  WBC 12.4* 7.8 11.0*  NEUTROABS 7.2  --   --   HGB 15.6*  13.7 13.0  HCT 44.1 39.1 38.0  MCV 90.4 91.1 92.0  PLT 439* 406* 406*    Recent Labs Lab 10/22/14 1337 10/22/14 1810 10/23/14 0059  TROPONINI <0.03 <0.03 <0.03   No results for input(s): LABPROT, INR in the last 72 hours. No results for input(s): COLORURINE, LABSPEC, [place], GLUCOSEU, HGBUR, BILIRUBINUR, KETONESUR, PROTEINUR, UROBILINOGEN, NITRITE, LEUKOCYTESUR in the last 72 hours.  Invalid input(s): APPERANCEUR     Component Value Date/Time   CHOL 169 10/22/2014 0549   TRIG 178* 10/22/2014 0549   HDL 28* 10/22/2014 0549   CHOLHDL 6.0 10/22/2014 0549   VLDL 36 10/22/2014 0549   LDLCALC 105* 10/22/2014 0549   Lab Results  Component Value Date   HGBA1C 10.1* 10/22/2014      Component Value Date/Time   LABOPIA NONE DETECTED 10/22/2014 1311   COCAINSCRNUR NONE DETECTED 10/22/2014 1311   LABBENZ NONE DETECTED 10/22/2014 1311   AMPHETMU NONE DETECTED 10/22/2014 1311   THCU NONE DETECTED 10/22/2014 1311   LABBARB NONE DETECTED 10/22/2014 1311    No results for input(s): ETH in the last 168 hours.  Ct Head Wo Contrast 10/21/2014    No evidence of acute intracranial abnormality.  Moderate to severe chronic small-vessel white matter ischemic changes.     MRI HEAD  10/21/2014    1. 5 mm focus of restricted diffusion within the inferior right thalamus as above, highly suspicious for a small acute ischemic infarct. No associated hemorrhage or mass effect.  2. No other acute intracranial process.  3. Remote lacunar infarcts within the right thalamus and pons.  4. Generalized cerebral atrophy with moderate chronic microvascular ischemic disease.    MRA HEAD   10/21/2014    1. No proximal branch occlusion or hemodynamically significant stenosis within the intracranial circulation.  2. Fetal origin of the PCAs bilaterally with diminutive vertebrobasilar system. There is mild atherosclerotic stenosis within the mid basilar artery.  3. Distal branch atheromatous irregularity within  the MCA and PCA branches bilaterally.     Carotid Doppler  There is 1-39% bilateral ICA stenosis. Vertebral artery flow is antegrade.   2D Echocardiogram  EF 55-60% with no source of embolus.   EEG  10/24/2014  10/23/2014  This is an abnormal electroencephalogram secondary to three electrographic seizures noted during the recording without clinical change. 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    PHYSICAL EXAM Frail elderly Caucasian lady not in distress. . Afebrile. Head is nontraumatic. Neck is supple without bruit.    Cardiac exam no murmur or gallop. Lungs are clear to auscultation. Distal pulses are well felt. Neurological Exam : Awake alert  oriented 2 now. Speech is fluent without hesitancy. She cannot remember events of the last couple of days and clearly but is a lot improved. Diminished attention, registration and recall. Blinks to threat bilaterally. Fundi could not be visualized. Face is symmetric without weakness. Tongue is midline. Motor system exam reveals no focal weakness and is also extremity well against gravity. Deep tendon pulses are symmetric. Plantars are downgoing. Gait was not tested.   ASSESSMENT/PLAN April [name] is a 74 y.o. female with a history of diabetes mellitus and hypertension presenting with altered mental status, speech difficulties, and gait disturbance. She did not receive IV t-PA due to late presentation.  Possible Gram positive sepsis  Low grade fever, Mild leukocytosis  UA unrevealing. Final culture pending   chest x-ray with bronchitis  ID following, started on Vancomycin  ? Contaminant. 1 or 2 BC +. Await Cx pathology  TEE scheduled for tomorrow to rule out endocarditis  Seizure  Altered mental status out of proportion to patient's infarct. Confusion likely secondary to unwitnessed seizure. Should improve once medication starts  EEG with 3 episodes of seizure  Improved but still confused. Cannot look to the  left. Concern for ongoing seizures  Will increase keppra to 500 bid (loaded w/ 500 followed by 250 bid)  Repeat EEG  Stroke:  Incidental non-dominant  infarct of the  inferior right thalamus likely secondary to small vessel disease. It cannot explain her presenting confusion.  MRI  tiny right thalamic/middle cerebral peduncle lacune which cannot explain her profound confusion.  Resultant  Altered mental status,confusion, delirium  Repeat MRI with and without contrast  Carotid Doppler  1-39% ICA stenosis. Vertebral artery flow is antegrade.  2D Echo  No source of embolus   TEE pending   UDS neg  Subcutaneous heparin for VTE prophylaxis DIET DYS 3  Diet - low sodium heart healthy for TEE   no antithrombotic prior to admission, now on aspirin 325 mg orally every day  Ongoing aggressive stroke risk factor management  Therapy recommendations: CIR  Disposition:  CIR   Hypertension  Home meds:   none  Stable  Hyperlipidemia  Home  meds:  none   LDL 105, goal < 70  Now on Lipitor 20 mg daily  Continue statin at discharge  Diabetes  HgbA1c 10.1, goal < 7.0  Uncontrolled  Other Stroke Risk Factors  Advanced age  Other Active Problems  Hypokalemia 2.8. Replace K  Urinary retention, cath placed  Social worker consult  Hospital day # [place] for Pager information 10/25/2014 8:11 PM  I have personally examined this patient, reviewed notes, independently viewed imaging studies, participated in medical decision making and plan of care. I have made any additions or clarifications directly to the above note. Repeat EKG shows no ongoing seizure activity. She seems a lot improved today and the plan is to send her to [facility] for rehabilitation. Discussed with family member at the bedside and answered questions. Antony Contras, MD Medical Director [facility] Pager: [phone] 10/25/2014 8:11  PM .setcos  To contact Stroke Continuity provider, please refer to [email or web address] After hours, contact General Neurology

## 2014-10-25 NOTE — Evaluation (Signed)
Speech Language Pathology Evaluation Patient Details Name: April [name] MRN: [id number] DOB: Aug 25, 1940 Today's Date: 10/25/2014 Time: 9381-0175 SLP Time Calculation (min) (ACUTE ONLY): 27 min  Problem List:  Patient Active Problem List   Diagnosis Date Noted  . Seizure disorder, status epilepticus, nonconvulsive   . Convulsion   . Cerebral infarction due to unspecified mechanism   . Encephalopathy   . Gram-positive cocci bacteremia   . Cerebral thrombosis with cerebral infarction 10/22/2014  . Aphasia 10/21/2014  . Retinopathy 06/06/2013  . GERD (gastroesophageal reflux disease) 05/06/2013  . Uncontrolled diabetes mellitus type 2 without complications 06/11/8526  . HYPERLIPIDEMIA 10/15/2006  . DEPRESSION, MAJOR, RECURRENT 10/15/2006  . HYPERTENSION, BENIGN SYSTEMIC 10/15/2006   Past Medical History:  Past Medical History  Diagnosis Date  . Diabetes mellitus   . Hypertension   . Retinopathy 06/06/2013    Per eye exam 05/31/13 eye exam, Moderate retinopathy    Past Surgical History:  Past Surgical History  Procedure Laterality Date  . Appendectomy  1972  . Abdominal hysterectomy  1972  . Tee without cardioversion N/A 10/24/2014    Procedure: TRANSESOPHAGEAL ECHOCARDIOGRAM (TEE);  Surgeon: Dorothy Spark, MD;  Location: [facility] ENDOSCOPY;  Service: Cardiovascular;  Laterality: N/A;   HPI:  74 y.o. female hx of DM, HTN presenting with altered mental status. MRI shows 5 mm focus of restricted diffusion within the inferior right thalamus as above, highly suspicious for a small acute ischemic infarct.    Assessment / Plan / Recommendation Clinical Impression  Pt's verbal expression improved since admission per pt and family where she was unable to effectively express thoughts. Today she exhibited mild dysnomia during conversational speech and required moderate verbal assist. SLP educated pt to continue to utilize synonyms if unable to express desired thought. Cognition for  basic information appeared functional. Suspect cognitive difficulty with higher level tasks such as financial and medication management. She demonstrated significant visual disturbances (OT documented right visual field impairments) that will limit her ability to perform writing/reading tasks independently. Speech is intelligible with mild distortions ntoed. Pt would benefit from continued ST and may be transferring soon to SNF where ST can eval and treat pt.     SLP Assessment  Patient needs continued Speech Lanaguage Pathology Services    Follow Up Recommendations  Skilled Nursing facility    Frequency and Duration min 2x/week  2 weeks   Pertinent Vitals/Pain Pain Assessment: No/denies pain Pain Score: 2  Pain Location: Headache Pain Descriptors / Indicators: Aching Pain Intervention(s): Limited activity within patient's tolerance;Monitored during session   SLP Goals  Potential to Achieve Goals (ACUTE ONLY): Good Potential Considerations (ACUTE ONLY): Previous level of function  SLP Evaluation Prior Functioning  Cognitive/Linguistic Baseline: Within functional limits Type of Home: House  Lives With: Alone   Cognition  Overall Cognitive Status: Impaired/Different from baseline Arousal/Alertness: Awake/alert Orientation Level: Oriented X4 Attention: Sustained Sustained Attention: Appears intact Memory: Appears intact Awareness: Appears intact Problem Solving: Impaired Problem Solving Impairment: Verbal basic Safety/Judgment: Appears intact    Comprehension  Auditory Comprehension Overall Auditory Comprehension: Appears within functional limits for tasks assessed Visual Recognition/Discrimination Discrimination: Not tested Reading Comprehension Reading Status: Impaired (right visual field deficit) Functional Environmental (signs, name badge): Impaired Interfering Components: Right neglect/inattention    Expression Expression Primary Mode of Expression: Verbal Verbal  Expression Overall Verbal Expression: Impaired Initiation: No impairment Level of Generative/Spontaneous Verbalization: Conversation Repetition: No impairment Naming: No impairment (dysnomia in conversation) Pragmatics: No impairment Written Expression Dominant Hand: Right Written  Expression: Exceptions to [facility] Self Formulation Ability:  (clock drawing)   Oral / Motor Oral Motor/Sensory Function Overall Oral Motor/Sensory Function: Impaired Labial ROM: Within Functional Limits Labial Symmetry: Abnormal symmetry left Lingual ROM: Within Functional Limits Motor Speech Overall Motor Speech: Impaired Respiration: Within functional limits Phonation: Normal Resonance: Within functional limits Articulation: Within functional limitis Intelligibility: Intelligible (however mild distortions) Motor Planning: Witnin functional limits   GO     Houston Siren 10/25/2014, 1:56 PM  Orbie Pyo Daijanae Rafalski M.Ed [employer] [phone]

## 2014-10-25 NOTE — Discharge Instructions (Signed)
STROKE/TIA DISCHARGE INSTRUCTIONS SMOKING Cigarette smoking nearly doubles your risk of having a stroke & is the single most alterable risk factor  If you smoke or have smoked in the last 12 months, you are advised to quit smoking for your health.  Most of the excess cardiovascular risk related to smoking disappears within a year of stopping.  Ask you doctor about anti-smoking medications  [place] Quit Line: 1-800-QUIT NOW  Free Smoking Cessation Classes [phone]  CHOLESTEROL Know your levels; limit fat & cholesterol in your diet  Lipid Panel     Component Value Date/Time   CHOL 169 10/22/2014 0549   TRIG 178* 10/22/2014 0549   HDL 28* 10/22/2014 0549   CHOLHDL 6.0 10/22/2014 0549   VLDL 36 10/22/2014 0549   LDLCALC 105* 10/22/2014 0549      Many patients benefit from treatment even if their cholesterol is at goal.  Goal: Total Cholesterol (CHOL) less than 160  Goal:  Triglycerides (TRIG) less than 150  Goal:  HDL greater than 40  Goal:  LDL (LDLCALC) less than 100   BLOOD PRESSURE American Stroke Association blood pressure target is less that 120/80 mm/Hg  Your discharge blood pressure is:  BP: (!) 115/47 mmHg  Monitor your blood pressure  Limit your salt and alcohol intake  Many individuals will require more than one medication for high blood pressure  DIABETES (A1c is a blood sugar average for last 3 months) Goal HGBA1c is under 7% (HBGA1c is blood sugar average for last 3 months)  Diabetes: Diagnosis of diabetes:  Your A1c:10.1 %    Lab Results  Component Value Date   HGBA1C 10.1* 10/22/2014     Your HGBA1c can be lowered with medications, healthy diet, and exercise.  Check your blood sugar as directed by your physician  Call your physician if you experience unexplained or low blood sugars.  PHYSICAL ACTIVITY/REHABILITATION Goal is 30 minutes at least 4 days per week  Activity: Increase activity slowly,, No driving, and Walk with assistance, Therapies:  Physical Therapy: Nursing Facility   Activity decreases your risk of heart attack and stroke and makes your heart stronger.  It helps control your weight and blood pressure; helps you relax and can improve your mood.  Participate in a regular exercise program.  Talk with your doctor about the best form of exercise for you (dancing, walking, swimming, cycling).  DIET/WEIGHT Goal is to maintain a healthy weight  Your discharge diet is: DIET DYS 3 Diet - low sodium heart healthy, thin liquids Your height is:  Height: 4\' 11"  (149.9 cm) Your current weight is: Weight: 48.217 kg (106 lb 4.8 oz) Your Body Mass Index (BMI) is:  BMI (Calculated): 21.5  Following the type of diet specifically designed for you will help prevent another stroke.  Your goal Body Mass Index (BMI) is 19-24.  Healthy food habits can help reduce 3 risk factors for stroke:  High cholesterol, hypertension, and excess weight.  RESOURCES Stroke/Support Group:  Call [phone]   STROKE EDUCATION PROVIDED/REVIEWED AND GIVEN TO PATIENT Stroke warning signs and symptoms How to activate emergency medical system (call 911). Medications prescribed at discharge. Need for follow-up after discharge. Personal risk factors for stroke. Pneumonia vaccine given: Yes, Date 10/25/14 Flu vaccine given: No My questions have been answered, the writing is legible, and I understand these instructions.  I will adhere to these goals & educational materials that have been provided to me after my discharge from the hospital.

## 2014-10-25 NOTE — Progress Notes (Signed)
PT Cancellation Note  Patient Details Name: April [name] MRN: [id number] DOB: [birth year]   Cancelled Treatment:    Reason Eval/Treat Not Completed: Patient declined, no reason specified Pt had just fallen asleep upon PT arrival and family reported they were to be transferred to [facility] place later today so PT not performed. Will follow up if pt still in hospital next available time.   Candy Sledge A 10/25/2014, 3:40 PM Candy Sledge, [place], DPT [phone]

## 2014-10-25 NOTE — Progress Notes (Signed)
EEG completed, results pending. 

## 2014-10-25 NOTE — Discharge Summary (Signed)
Physician Discharge Summary  Shoshanah Dapper Wren ERX:[id number] DOB: [birth year] DOA: 10/21/2014  PCP: Nance Pear., NP  Admit date: 10/21/2014 Discharge date: 10/25/2014  Time spent: >30  minutes  Recommendations for Outpatient Follow-up:  1. BMET to follow electrolytes and renal function 2. Reassess BP and adjust medications as needed 3. Close follow up to patient CBG and diabetes; adjust insulin regimen as needed  Discharge Diagnoses:  Principal Problem:   Aphasia   Uncontrolled diabetes mellitus type 2    HYPERTENSION, BENIGN SYSTEMIC   Convulsion   Cerebral infarction due to unspecified mechanism   Encephalopathy   Gram-positive cocci bacteremia (contaminant)   Seizure disorder, status epilepticus, nonconvulsive   HLD (hyperlipidemia)   Discharge Condition: stable. Will discharge to SNF for rehabilitation and further treatment  Diet recommendation: dysphagia 3 (heart healthy diet and low carb diet)  Filed Weights   10/21/14 2218  Weight: 48.217 kg (106 lb 4.8 oz)    History of present illness:  74 y.o. female h/o HTN, DM2, patient presents to ED for eval of AMS. Patient is brought in by neighbor who noted patient was acting differently today. Patient appeared to have difficulty walking, difficulty with speech and garbled speech. Car hadnt been moved in 3 days neighbor notes.  Hospital Course:  1-stroke: non-dominant infarct right thalamus (due to small vessel disease) -aspirin for secondary prevention -aggressive risk factor modification (diabetes control and statins) -follow up with Dr. Leonie Man in 1-2 months -SNF for further rehabilitation (PT, OT and SPT)  2-dysphagia: residual from #1 -dysphagia 3 diet -Speech therapy to follow patient at SNF  3-seizure disorder: with ongoing seizure as seen on EEG -stabilized/resolved with increase dose of keppra -continue keppra 500mg  BID -follow up with neurology as an outpatient  4-GERD: will continue  PPI  5-diabetes type 2 uncontrolled -A1C 10.1 -will treat with low carb diet -levmir 25 units QHS -Amaryl 2 mg daily -sugar to be checked TID and further adjustment to insulin therapy to be done base on patient CBG's fluctuation  6-essential HTN: medication adjusted for better BP controlled -patient advise to follow low sodium diet  7-HLD: continue statins   8-hypokalemia: resolved -BMET to be checkduring follow up visit  9-1/2 positive blood cultures: due to contaminant  -no antibiotic therapy needed -no vegetation seen on TEE   Procedures:  TEE : no ASD, no PFO, normal bubble study  2-D echo: - Left ventricle: The cavity size was normal. Systolic function was normal. The estimated ejection fraction was in the range of 55% to 60%. Wall motion was normal; there were no regional wall motion abnormalities. Doppler parameters are consistent with abnormal left ventricular relaxation (grade 1 diastolic dysfunction). Doppler parameters are consistent with elevated ventricular end-diastolic filling pressure. - Aortic valve: Trileaflet; mildly thickened, mildly calcified leaflets. There was no regurgitation. - Aortic root: The aortic root was normal in size. - Mitral valve: Calcified annulus. There was mild regurgitation. - Right ventricle: The cavity size was normal. Wall thickness was normal. Systolic function was normal. - Right atrium: The atrium was normal in size. - Tricuspid valve: Structurally normal valve. There was trivial regurgitation. - Pulmonic valve: Structurally normal valve. - Pulmonary arteries: Systolic pressure was within the normal range. - Inferior vena cava: The vessel was normal in size. - Pericardium, extracardiac: A trivial pericardial effusion was identified.  Carotid duplex:  -BIlateral: minimal plaque noted. 1-39% ICA stenosis. Vertebral artery flow is antegrade. ICA/CCA ratio: R-0.69 L-0.76.  EEG 3/7: positive for  epileptic abnormalities appreciated  on exam  EEG 3/9: after keppra dose adjusted; no further epileptic abnormalities seen   Consultations:  Cardiology for TEE  Neurology   Infectious disease   Discharge Exam: Filed Vitals:   10/25/14 1526  BP: 115/47  Pulse:   Temp:   Resp:     General: Afebrile, no vomiting and able to keep food and PO meds Cardiovascular:normal S1 and S2, no gallops, no rubs Respiratory: CTA bilaterally Abd: soft, NT, ND, positive BS Neuro: AAOX2, no focal motor abnormalities  Extremities: no edema or cyanosis  Discharge Instructions   Discharge Instructions    Diet - low sodium heart healthy    Complete by:  As directed      Discharge instructions    Complete by:  As directed   Heart healthy and low carb diet Take medications as prescribed Follow with PCP in 2 weeks Follow up with neurology service (Dr. Leonie Man) as instructed          Current Discharge Medication List    START taking these medications   Details  amLODipine (NORVASC) 10 MG tablet Take 1 tablet (10 mg total) by mouth daily.    aspirin EC 325 MG tablet Take 1 tablet (325 mg total) by mouth daily. Qty: 30 tablet, Refills: 0    atorvastatin (LIPITOR) 20 MG tablet Take 1 tablet (20 mg total) by mouth daily at 6 PM.    carvedilol (COREG) 25 MG tablet Take 1 tablet (25 mg total) by mouth 2 (two) times daily with a meal.    glimepiride (AMARYL) 2 MG tablet Take 1 tablet (2 mg total) by mouth daily with breakfast.    insulin detemir (LEVEMIR) 100 UNIT/ML injection Inject 0.25 mLs (25 Units total) into the skin daily. Qty: 10 mL, Refills: 11    isosorbide-hydrALAZINE (BIDIL) 20-37.5 MG per tablet Take 1 tablet by mouth 3 (three) times daily.    levETIRAcetam (KEPPRA) 500 MG tablet Take 1 tablet (500 mg total) by mouth 2 (two) times daily.    ondansetron (ZOFRAN ODT) 8 MG disintegrating tablet Take 1 tablet (8 mg total) by mouth every 8 (eight) hours as needed for nausea or  vomiting. Qty: 20 tablet, Refills: 0    pantoprazole (PROTONIX) 40 MG tablet Take 1 tablet (40 mg total) by mouth daily.    potassium chloride SA (K-DUR,KLOR-CON) 20 MEQ tablet Take 2 tablets (40 mEq total) by mouth daily.       Allergies  Allergen Reactions  . Codeine Nausea And Vomiting  . Sulfa Antibiotics Nausea And Vomiting   Follow-up Information    Follow up with Nance Pear., NP. Schedule an appointment as soon as possible for a visit in 2 weeks.   Specialty:  Internal Medicine   Contact information:   [address] [place] [address] [phone]       Follow up with SETHI,PRAMOD, MD.   Specialties:  Neurology, Radiology   Why:  office to confirmed follow up appointment   Contact information:   [address] [place] [address] [phone]       The results of significant diagnostics from this hospitalization (including imaging, microbiology, ancillary and laboratory) are listed below for reference.    Significant Diagnostic Studies: Dg Chest 2 View  10/22/2014   CLINICAL DATA:  Initial evaluation Fever and altered mental status.H/o diabetes and HTN ppm  EXAM: CHEST  2 VIEW  COMPARISON:  None.  FINDINGS: Mild cardiac enlargement. Uncoiling of the aorta. Vascular  pattern normal. Mild bilateral bronchitic change. Mild diffuse interstitial prominence. No consolidation or effusion.  IMPRESSION: Bronchitic change with diffuse interstitial prominence most suggestive of chronic mild interstitial lung disease. Atypical bilateral pneumonia considered less likely.   Electronically Signed   By: Skipper Cliche M.D.   On: 10/22/2014 18:15   Ct Head Wo Contrast  10/21/2014   CLINICAL DATA:  Acute aphasia, confusion and altered mental status.  EXAM: CT HEAD WITHOUT CONTRAST  TECHNIQUE: Contiguous axial images were obtained from the base of the skull through the vertex without intravenous contrast.  COMPARISON:  03/03/2010 MRI and 03/02/2010  CT  FINDINGS: Moderate to severe chronic small-vessel white matter ischemic changes are again identified.  No acute intracranial abnormalities are identified, including mass lesion or mass effect, hydrocephalus, extra-axial fluid collection, midline shift, hemorrhage, or acute infarction.  The visualized bony calvarium is unremarkable.  IMPRESSION: No evidence of acute intracranial abnormality.  Moderate to severe chronic small-vessel white matter ischemic changes.   Electronically Signed   By: Margarette Canada M.D.   On: 10/21/2014 19:53   Mr Brain Wo Contrast  10/21/2014   CLINICAL DATA:  Initial evaluation for acute altered mental status. History of hypertension, diabetes.  EXAM: MRI HEAD WITHOUT CONTRAST  MRA HEAD WITHOUT CONTRAST  TECHNIQUE: Multiplanar, multiecho pulse sequences of the brain and surrounding structures were obtained without intravenous contrast. Angiographic images of the head were obtained using MRA technique without contrast.  COMPARISON:  Prior head CT from earlier the same day.  FINDINGS: MRI HEAD FINDINGS  Diffuse prominence of the CSF containing spaces is compatible with generalized cerebral atrophy. Patchy and confluent T2/FLAIR hyperintensity within the periventricular and deep white matter both cerebral hemispheres noted, most compatible with moderate chronic small vessel ischemic disease.  No mass lesion or midline shift. Ventricular prominence roller global parenchymal volume loss present without hydrocephalus. No extra-axial fluid collection.  No findings to suggest posterior reversible encephalopathy syndrome.  Small remote lacunar infarct present within the right pons. Additional small remote lacunar infarcts within the right thalamus.  There is a small 5 mm focus of high signal intensity on DWI signal in the inferolateral right thalamus, extending towards the right middle cerebral peduncle, suspicious for a small acute ischemic infarct (series 4, image 20). No findings on T2  weighted sequence or ADC map to suggest that this is T2 shine through. No associated hemorrhage or significant mass effect. No other acute ischemic infarct.  Craniocervical junction within normal limits. Pituitary gland normal. No acute abnormality seen about the orbits. Paranasal sinuses clear. Mastoid no mastoid effusion.  Bone marrow signal intensity normal. Mild degenerative changes noted within the upper cervical spine. Scalp soft tissues within normal limits.  MRA HEAD FINDINGS  ANTERIOR CIRCULATION:  Visualized distal cervical segments of the internal carotid arteries are widely patent with antegrade flow. The petrous segments are widely patent. There is mild multi focal atherosclerotic irregularity within the cavernous carotid arteries bilaterally without flow-limiting stenosis. These changes are slightly worse on the left. Supraclinoid segments widely patent. A1 segments, anterior communicating artery common anterior cerebral artery is well opacified.  M1 segments widely patent without occlusion or stenosis. MCA bifurcation is normal. Atheromatous irregularity present within the distal MCA branches bilaterally.  POSTERIOR CIRCULATION:  Vertebral arteries somewhat diminutive but patent bilaterally to the vertebrobasilar junction. Posterior inferior cerebellar arteries are patent proximal Ing, not well evaluated distally. Mild multi focal atherosclerotic irregularity present throughout the basilar artery with superimposed mild stenosis within the mid basilar  artery (series 505, image 12). Basilar artery itself is diminutive. Anterior inferior cerebral arteries are patent proximally. Superior cerebellar arteries patent. There is fetal origin of the posterior cerebral arteries bilaterally with widely patent posterior communicating arteries. Mild distal branch irregularity present within the PCA arteries bilaterally. Distal right PCA not well visualized, and may be partially occluded.  No aneurysm or vascular  malformation.  IMPRESSION: MRI HEAD IMPRESSION:  1. 5 mm focus of restricted diffusion within the inferior right thalamus as above, highly suspicious for a small acute ischemic infarct. No associated hemorrhage or mass effect. 2. No other acute intracranial process. 3. Remote lacunar infarcts within the right thalamus and pons. 4. Generalized cerebral atrophy with moderate chronic microvascular ischemic disease.  MRA HEAD IMPRESSION:  1. No proximal branch occlusion or hemodynamically significant stenosis within the intracranial circulation. 2. Fetal origin of the PCAs bilaterally with diminutive vertebrobasilar system. There is mild atherosclerotic stenosis within the mid basilar artery. 3. Distal branch atheromatous irregularity within the MCA and PCA branches bilaterally.   Electronically Signed   By: Jeannine Boga M.D.   On: 10/21/2014 22:48   Mr Jeri Cos ID Contrast  10/25/2014   CLINICAL DATA:  Seizure activity. Recent MRI demonstrating acute RIGHT inferior thalamic infarct, and profound change in mental status. Assess recurrent seizure. History of diabetes and hypertension.  EXAM: MRI HEAD WITHOUT AND WITH CONTRAST  TECHNIQUE: Multiplanar, multiecho pulse sequences of the brain and surrounding structures were obtained without and with intravenous contrast.  CONTRAST:  36mL MULTIHANCE GADOBENATE DIMEGLUMINE 529 MG/ML IV SOLN  COMPARISON:  MRI of the brain October 21, 2014  FINDINGS: Sub cm focus of reduced diffusion in RIGHT hypothalamus, relatively normalized ADC value. No new areas or reduced diffusion. No susceptibility artifact to suggest hemorrhage.  Moderate ventriculomegaly, Mild disproportionate sulcal effacement at the convexities. Patchy to confluent supratentorial and pontine white matter T2 hyperintensities. Remote RIGHT pontine subcentimeter infarct. No midline shift, mass effect, mass lesions. Prominent LEFT insular cerebral artery vessel most consistent with tortuous vessel as no aneurysm  was identified on recent MRA.  Status post bilateral ocular lens implants. Paranasal sinuses and mastoid air cells are well aerated. No abnormal sellar expansion. No cerebellar tonsillar ectopia. No suspicious calvarial bone marrow signal.  The due to mild motion, limited assessment of the hippocampi, which demonstrate grossly symmetric morphology, signal and size.  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  No acute ischemia.  Findings of normal pressure hydrocephalus.  Moderate white matter changes suggest chronic small vessel ischemic disease. Remote sub cm RIGHT pontine infarct.  No abnormal intracranial enhancement.   Electronically Signed   By: Elon Alas   On: 10/25/2014 05:15   Mr Jodene Nam Head/brain Wo Cm  10/21/2014   CLINICAL DATA:  Initial evaluation for acute altered mental status. History of hypertension, diabetes.  EXAM: MRI HEAD WITHOUT CONTRAST  MRA HEAD WITHOUT CONTRAST  TECHNIQUE: Multiplanar, multiecho pulse sequences of the brain and surrounding structures were obtained without intravenous contrast. Angiographic images of the head were obtained using MRA technique without contrast.  COMPARISON:  Prior head CT from earlier the same day.  FINDINGS: MRI HEAD FINDINGS  Diffuse prominence of the CSF containing spaces is compatible with generalized cerebral atrophy. Patchy and confluent T2/FLAIR hyperintensity within the periventricular and deep white matter both cerebral hemispheres noted, most compatible with moderate chronic small vessel ischemic disease.  No mass lesion or midline shift. Ventricular prominence roller global parenchymal volume loss present without hydrocephalus. No extra-axial fluid  collection.  No findings to suggest posterior reversible encephalopathy syndrome.  Small remote lacunar infarct present within the right pons. Additional small remote lacunar infarcts within the right thalamus.  There is a small 5 mm focus of high signal intensity on DWI signal in  the inferolateral right thalamus, extending towards the right middle cerebral peduncle, suspicious for a small acute ischemic infarct (series 4, image 20). No findings on T2 weighted sequence or ADC map to suggest that this is T2 shine through. No associated hemorrhage or significant mass effect. No other acute ischemic infarct.  Craniocervical junction within normal limits. Pituitary gland normal. No acute abnormality seen about the orbits. Paranasal sinuses clear. Mastoid no mastoid effusion.  Bone marrow signal intensity normal. Mild degenerative changes noted within the upper cervical spine. Scalp soft tissues within normal limits.  MRA HEAD FINDINGS  ANTERIOR CIRCULATION:  Visualized distal cervical segments of the internal carotid arteries are widely patent with antegrade flow. The petrous segments are widely patent. There is mild multi focal atherosclerotic irregularity within the cavernous carotid arteries bilaterally without flow-limiting stenosis. These changes are slightly worse on the left. Supraclinoid segments widely patent. A1 segments, anterior communicating artery common anterior cerebral artery is well opacified.  M1 segments widely patent without occlusion or stenosis. MCA bifurcation is normal. Atheromatous irregularity present within the distal MCA branches bilaterally.  POSTERIOR CIRCULATION:  Vertebral arteries somewhat diminutive but patent bilaterally to the vertebrobasilar junction. Posterior inferior cerebellar arteries are patent proximal Ing, not well evaluated distally. Mild multi focal atherosclerotic irregularity present throughout the basilar artery with superimposed mild stenosis within the mid basilar artery (series 505, image 12). Basilar artery itself is diminutive. Anterior inferior cerebral arteries are patent proximally. Superior cerebellar arteries patent. There is fetal origin of the posterior cerebral arteries bilaterally with widely patent posterior communicating arteries.  Mild distal branch irregularity present within the PCA arteries bilaterally. Distal right PCA not well visualized, and may be partially occluded.  No aneurysm or vascular malformation.  IMPRESSION: MRI HEAD IMPRESSION:  1. 5 mm focus of restricted diffusion within the inferior right thalamus as above, highly suspicious for a small acute ischemic infarct. No associated hemorrhage or mass effect. 2. No other acute intracranial process. 3. Remote lacunar infarcts within the right thalamus and pons. 4. Generalized cerebral atrophy with moderate chronic microvascular ischemic disease.  MRA HEAD IMPRESSION:  1. No proximal branch occlusion or hemodynamically significant stenosis within the intracranial circulation. 2. Fetal origin of the PCAs bilaterally with diminutive vertebrobasilar system. There is mild atherosclerotic stenosis within the mid basilar artery. 3. Distal branch atheromatous irregularity within the MCA and PCA branches bilaterally.   Electronically Signed   By: Jeannine Boga M.D.   On: 10/21/2014 22:48    Microbiology: Recent Results (from the past 240 hour(s))  Culture, Urine     Status: None   Collection Time: 10/22/14  1:11 PM  Result Value Ref Range Status   Specimen Description URINE, CATHETERIZED  Final   Special Requests NONE  Final   Colony Count NO GROWTH Performed at [facility]   Final   Culture NO GROWTH Performed at [facility]   Final   Report Status 10/23/2014 FINAL  Final  Culture, blood (routine x 2)     Status: None (Preliminary result)   Collection Time: 10/22/14  1:37 PM  Result Value Ref Range Status   Specimen Description BLOOD LEFT ANTECUBITAL  Final   Special Requests BOTTLES DRAWN AEROBIC AND ANAEROBIC 10  CC  Final   Culture   Final           BLOOD CULTURE RECEIVED NO GROWTH TO DATE CULTURE WILL BE HELD FOR 5 DAYS BEFORE ISSUING A FINAL NEGATIVE REPORT Performed at [facility]    Report Status PENDING  Incomplete   Culture, blood (routine x 2)     Status: None   Collection Time: 10/22/14  1:47 PM  Result Value Ref Range Status   Specimen Description BLOOD LEFT ARM  Final   Special Requests BOTTLES DRAWN AEROBIC ONLY 3 CC  Final   Culture   Final    STAPHYLOCOCCUS SPECIES (COAGULASE NEGATIVE) Note: THE SIGNIFICANCE OF ISOLATING THIS ORGANISM FROM A SINGLE SET OF BLOOD CULTURES WHEN MULTIPLE SETS ARE DRAWN IS UNCERTAIN. PLEASE NOTIFY THE MICROBIOLOGY DEPARTMENT WITHIN ONE WEEK IF SPECIATION AND SENSITIVITIES ARE REQUIRED. Note: Gram Stain Report Called to,Read Back By and Verified With: JAMIE WHITE 10/23/14 1307 BY SMITHERSJ Performed at [facility]    Report Status 10/24/2014 FINAL  Final    Labs: Basic Metabolic Panel:  Recent Labs Lab 10/21/14 1625 10/22/14 1337 10/23/14 0737 10/24/14 0752  NA 137 139 140 140  K 2.8* 2.6* 2.9* 4.4  CL 90* 94* 101 106  CO2 34* 36* 32 28  GLUCOSE 364* 177* 236* 234*  BUN 14 14 9  5*  CREATININE 0.64 0.58 0.46* 0.47*  CALCIUM 9.5 8.9 8.4 8.7  MG  --  1.9  --   --    Liver Function Tests:  Recent Labs Lab 10/21/14 1625 10/22/14 1337 10/23/14 0737  AST 17 18 17   ALT 14 13 14   ALKPHOS 68 67 61  BILITOT 1.0 0.9 0.7  PROT 7.9 7.2 6.2  ALBUMIN 3.7 3.4* 2.8*   CBC:  Recent Labs Lab 10/21/14 1625 10/23/14 0737 10/24/14 0752  WBC 12.4* 7.8 11.0*  NEUTROABS 7.2  --   --   HGB 15.6* 13.7 13.0  HCT 44.1 39.1 38.0  MCV 90.4 91.1 92.0  PLT 439* 406* 406*   Cardiac Enzymes:  Recent Labs Lab 10/22/14 1337 10/22/14 1810 10/23/14 0059  TROPONINI <0.03 <0.03 <0.03   CBG:  Recent Labs Lab 10/24/14 0637 10/24/14 1613 10/24/14 2212 10/25/14 0646 10/25/14 1301  GLUCAP 229* 247* 192* 141* 248*    Signed:  Barton Dubois  [facility] 10/25/2014, 3:28 PM

## 2014-10-25 NOTE — Procedures (Signed)
ELECTROENCEPHALOGRAM REPORT   Patient: April [name]       Room #: 4N10 EEG No. ID: [id number] Age: 74 y.o.        Sex: female Referring Physician: [place] Report Date:  10/25/2014        Interpreting Physician: Alexis Goodell  History: April [name] is an 74 y.o. female with altered mental status  Medications:  Scheduled: . amLODipine  10 mg Oral Daily  . aspirin  300 mg Rectal Daily   Or  . aspirin  325 mg Oral Daily  . atorvastatin  20 mg Oral q1800  . carvedilol  25 mg Oral BID WC  . heparin  5,000 Units Subcutaneous 3 times per day  . Influenza vac split quadrivalent PF  0.5 mL Intramuscular Tomorrow-1000  . insulin aspart  0-5 Units Subcutaneous QHS  . insulin aspart  0-9 Units Subcutaneous TID WC  . insulin detemir  25 Units Subcutaneous Daily  . isosorbide-hydrALAZINE  1 tablet Oral TID  . levETIRAcetam  500 mg Intravenous Q12H  . pneumococcal 23 valent vaccine  0.5 mL Intramuscular Tomorrow-1000  . potassium chloride  40 mEq Oral BID    Conditions of Recording:  This is a 16 channel EEG carried out with the patient in the awake and drowsy states.  Description:  The waking background activity consists of a low voltage, symmetrical, fairly well organized, 10 Hz alpha activity, seen from the parieto-occipital and posterior temporal regions.  Low voltage fast activity, poorly organized, is seen anteriorly and is at times superimposed on more posterior regions.  A mixture of theta and alpha rhythms are seen from the central and temporal regions. The patient drowses with slowing to irregular, low voltage theta and beta activity.   Stage II sleep is not obtained. Hyperventilation was not performed.  Intermittent photic stimulation was performed but failed to illicit any change in the tracing.    IMPRESSION: Normal electroencephalogram, awake, drowsy and with activation procedures. There are no focal lateralizing or epileptiform features.  The previously seen left  occipital electrographic seizure activity from 10/23/2014  is not present on this tracing.     Alexis Goodell, MD [facility] [phone] 10/25/2014, 12:17 PM

## 2014-10-25 NOTE — Progress Notes (Signed)
Occupational Therapy Treatment Patient Details Name: April [name] MRN: [id number] DOB: Jun 20, 1941 Today's Date: 10/25/2014    History of present illness April [name] is an 74 y.o. female history of HTN, DM presenting for evaluation of altered mental status. Patient brought in by neighbor who noted patient was acting differently today. Having trouble getting her words out, speech didn't always make sense and she appeared to be having difficulty walking.   OT comments  Pt pleasant and cooperative.  Still with moderate right visual field deficit noted with functional tasks.  Also with impaired balance, currently at min assist level for toileting and selfcare tasks.  She demonstrated decreased problem solving with breakfast needing cueing to realize she needed to use a spoon for her oatmeal as she repeatedly attempted to pour it into her mouth.  Will continue to follow with anticipation of SNF for follow-up.   Follow Up Recommendations  Supervision/Assistance - 24 hour;SNF    Equipment Recommendations  Other (comment) (TBD next venue of care)    Recommendations for Other Services      Precautions / Restrictions Precautions Precautions: Fall Precaution Comments: right visual field deficit Restrictions Weight Bearing Restrictions: No       Mobility Bed Mobility Overal bed mobility: Needs Assistance Bed Mobility: Supine to Sit     Supine to sit: Supervision        Transfers Overall transfer level: Needs assistance   Transfers: Stand Pivot Transfers Sit to Stand: Min guard Stand pivot transfers: Min assist            Balance Overall balance assessment: Needs assistance   Sitting balance-Leahy Scale: Fair       Standing balance-Leahy Scale: Fair                     ADL   Eating/Feeding: Supervision/ safety   Grooming: Wash/dry hands;Min guard;Cueing for sequencing Grooming Details (indicate cue type and reason): Mod instructional cueing for  locating towel, soap, and washcloth right of midline                 Toilet Transfer: Minimal assistance;Comfort height toilet;Ambulation;Grab bars   Toileting- Clothing Manipulation and Hygiene: Minimal assistance;Sit to/from stand         General ADL Comments: Pt inconsistent with left/right discrimination.  She needs mod instructional cueing to sequence through simple selfcare tasks.  Noted pt attempting to pour oatmeal into her mouth multiple times before being cued to use a spoon.                Cognition   Behavior During Therapy: WFL for tasks assessed/performed Overall Cognitive Status: Impaired/Different from baseline   Orientation Level: Place;Time;Situation Current Attention Level: Alternating    Following Commands: Follows one step commands inconsistently Safety/Judgement: Decreased awareness of safety   Problem Solving: Slow processing;Requires verbal cues General Comments: Pt with decreased ability to process instructions for further visual testing as well as when attempting finger to nose testing.                  Pertinent Vitals/ Pain       Pain Assessment: Faces Pain Score: 2  Pain Location: Headache Pain Descriptors / Indicators: Aching Pain Intervention(s): Limited activity within patient's tolerance;Monitored during session  Home Living  Frequency Min 2X/week     Progress Toward Goals  OT Goals(current goals can now be found in the care plan section)  Progress towards OT goals: Progressing toward goals     Plan Discharge plan needs to be updated       End of Session Equipment Utilized During Treatment: Gait belt   Activity Tolerance Patient tolerated treatment well   Patient Left in chair;with call bell/phone within reach;with family/visitor present   Nurse Communication Mobility status        Time: 5750-5183 OT Time Calculation (min): 42 min  Charges: OT  General Charges $OT Visit: 1 Procedure OT Treatments $Self Care/Home Management : 38-52 mins  April [name] OTR/L 10/25/2014, 10:28 AM

## 2014-10-25 NOTE — Clinical Social Work Note (Signed)
Clinical Social Worker notified that [facility] have received [employer] authorization and paperwork has been completed by pt's family (niece, [name]).   CSW will continue to follow pt and pt's family for continued support and to facilitate pt's discharge once medically stable.  FL-2 on chart for MD signature.    Glendon Axe, MSW, LCSWA [phone] 10/25/2014 11:52 AM

## 2014-10-26 ENCOUNTER — Telehealth: Payer: Self-pay | Admitting: Family

## 2014-10-26 ENCOUNTER — Emergency Department (HOSPITAL_COMMUNITY)
Admission: EM | Admit: 2014-10-26 | Discharge: 2014-10-26 | Disposition: A | Discharge status: Home / Self Care | Payer: Medicare HMO | Attending: Emergency Medicine | Admitting: Emergency Medicine

## 2014-10-26 ENCOUNTER — Encounter (HOSPITAL_COMMUNITY): Payer: Self-pay

## 2014-10-26 DIAGNOSIS — Z79899 Other long term (current) drug therapy: Secondary | ICD-10-CM | POA: Insufficient documentation

## 2014-10-26 DIAGNOSIS — I10 Essential (primary) hypertension: Secondary | ICD-10-CM | POA: Insufficient documentation

## 2014-10-26 DIAGNOSIS — E119 Type 2 diabetes mellitus without complications: Secondary | ICD-10-CM | POA: Diagnosis not present

## 2014-10-26 DIAGNOSIS — Z7982 Long term (current) use of aspirin: Secondary | ICD-10-CM | POA: Diagnosis not present

## 2014-10-26 DIAGNOSIS — G40909 Epilepsy, unspecified, not intractable, without status epilepticus: Secondary | ICD-10-CM | POA: Diagnosis not present

## 2014-10-26 DIAGNOSIS — K625 Hemorrhage of anus and rectum: Secondary | ICD-10-CM | POA: Diagnosis present

## 2014-10-26 DIAGNOSIS — K921 Melena: Secondary | ICD-10-CM | POA: Insufficient documentation

## 2014-10-26 DIAGNOSIS — Z794 Long term (current) use of insulin: Secondary | ICD-10-CM | POA: Insufficient documentation

## 2014-10-26 HISTORY — DX: Aphasia: R47.01

## 2014-10-26 HISTORY — DX: Unspecified convulsions: R56.9

## 2014-10-26 LAB — CBC WITH DIFFERENTIAL/PLATELET
Basophils Absolute: 0 10*3/uL (ref 0.0–0.1)
Basophils Relative: 0 % (ref 0–1)
Eosinophils Absolute: 0.1 10*3/uL (ref 0.0–0.7)
Eosinophils Relative: 1 % (ref 0–5)
HEMATOCRIT: 37.9 % (ref 36.0–46.0)
HEMOGLOBIN: 12.7 g/dL (ref 12.0–15.0)
LYMPHS PCT: 22 % (ref 12–46)
Lymphs Abs: 2.3 10*3/uL (ref 0.7–4.0)
MCH: 31.3 pg (ref 26.0–34.0)
MCHC: 33.5 g/dL (ref 30.0–36.0)
MCV: 93.3 fL (ref 78.0–100.0)
MONO ABS: 0.8 10*3/uL (ref 0.1–1.0)
MONOS PCT: 8 % (ref 3–12)
NEUTROS ABS: 7.2 10*3/uL (ref 1.7–7.7)
NEUTROS PCT: 69 % (ref 43–77)
Platelets: 386 10*3/uL (ref 150–400)
RBC: 4.06 MIL/uL (ref 3.87–5.11)
RDW: 13 % (ref 11.5–15.5)
WBC: 10.4 10*3/uL (ref 4.0–10.5)

## 2014-10-26 LAB — COMPREHENSIVE METABOLIC PANEL
ALK PHOS: 56 U/L (ref 39–117)
ALT: 13 U/L (ref 0–35)
ANION GAP: 10 (ref 5–15)
AST: 15 U/L (ref 0–37)
Albumin: 3.1 g/dL — ABNORMAL LOW (ref 3.5–5.2)
BUN: 14 mg/dL (ref 6–23)
CO2: 25 mmol/L (ref 19–32)
Calcium: 9.3 mg/dL (ref 8.4–10.5)
Chloride: 101 mmol/L (ref 96–112)
Creatinine, Ser: 0.65 mg/dL (ref 0.50–1.10)
GFR calc non Af Amer: 86 mL/min — ABNORMAL LOW (ref >90)
GLUCOSE: 225 mg/dL — AB (ref 70–99)
Potassium: 5.1 mmol/L (ref 3.5–5.1)
Sodium: 136 mmol/L (ref 135–145)
Total Bilirubin: 0.8 mg/dL (ref 0.3–1.2)
Total Protein: 6.2 g/dL (ref 6.0–8.3)

## 2014-10-26 LAB — POC OCCULT BLOOD, ED: Fecal Occult Bld: NEGATIVE

## 2014-10-26 NOTE — ED Provider Notes (Signed)
CSN: [id number]     Arrival date & time 10/26/14  1844 History   First MD Initiated Contact with Patient 10/26/14 1920     Chief Complaint  Patient presents with  . Rectal Bleeding     (Consider location/radiation/quality/duration/timing/severity/associated sxs/prior Treatment) Patient is a 74 y.o. female presenting with hematochezia.  Rectal Bleeding Quality:  Bright red Amount:  Scant Duration:  1 day Timing:  Intermittent Progression:  Unchanged Chronicity:  New Similar prior episodes: no   Worsened by:  Nothing tried Ineffective treatments:  None tried Associated symptoms: no abdominal pain, no dizziness, no fever, no hematemesis and no vomiting     Past Medical History  Diagnosis Date  . Diabetes mellitus   . Hypertension   . Retinopathy 06/06/2013    Per eye exam 05/31/13 eye exam, Moderate retinopathy   . Aphasia   . Seizures    Past Surgical History  Procedure Laterality Date  . Appendectomy  1972  . Abdominal hysterectomy  1972  . Tee without cardioversion N/A 10/24/2014    Procedure: TRANSESOPHAGEAL ECHOCARDIOGRAM (TEE);  Surgeon: Dorothy Spark, MD;  Location: [name] ENDOSCOPY;  Service: Cardiovascular;  Laterality: N/A;   Family History  Problem Relation Age of Onset  . Arthritis Maternal Grandmother   . Heart disease Maternal Grandmother 80   History  Substance Use Topics  . Smoking status: Never Smoker   . Smokeless tobacco: Never Used  . Alcohol Use: No   OB History    No data available     Review of Systems  Constitutional: Negative for fever.  Gastrointestinal: Positive for blood in stool and hematochezia. Negative for vomiting, abdominal pain and hematemesis.  Neurological: Negative for dizziness.  All other systems reviewed and are negative.     Allergies  Codeine and Sulfa antibiotics  Home Medications   Prior to Admission medications   Medication Sig Start Date End Date Taking? Authorizing Provider  amLODipine (NORVASC) 10 MG  tablet Take 1 tablet (10 mg total) by mouth daily. 10/25/14   Barton Dubois, MD  aspirin EC 325 MG tablet Take 1 tablet (325 mg total) by mouth daily. 10/25/14   Barton Dubois, MD  atorvastatin (LIPITOR) 20 MG tablet Take 1 tablet (20 mg total) by mouth daily at 6 PM. 10/25/14   Barton Dubois, MD  carvedilol (COREG) 25 MG tablet Take 1 tablet (25 mg total) by mouth 2 (two) times daily with a meal. 10/25/14   Barton Dubois, MD  glimepiride (AMARYL) 2 MG tablet Take 1 tablet (2 mg total) by mouth daily with breakfast. 10/25/14   Barton Dubois, MD  insulin detemir (LEVEMIR) 100 UNIT/ML injection Inject 0.25 mLs (25 Units total) into the skin daily. 10/25/14   Barton Dubois, MD  isosorbide-hydrALAZINE (BIDIL) 20-37.5 MG per tablet Take 1 tablet by mouth 3 (three) times daily. 10/25/14   Barton Dubois, MD  levETIRAcetam (KEPPRA) 500 MG tablet Take 1 tablet (500 mg total) by mouth 2 (two) times daily. 10/25/14   Barton Dubois, MD  ondansetron (ZOFRAN ODT) 8 MG disintegrating tablet Take 1 tablet (8 mg total) by mouth every 8 (eight) hours as needed for nausea or vomiting. 10/25/14   Barton Dubois, MD  pantoprazole (PROTONIX) 40 MG tablet Take 1 tablet (40 mg total) by mouth daily. 10/25/14   Barton Dubois, MD  potassium chloride SA (K-DUR,KLOR-CON) 20 MEQ tablet Take 2 tablets (40 mEq total) by mouth daily. 10/25/14   Barton Dubois, MD   BP 111/58 mmHg  Pulse 79  Temp(Src) 98.5 F (36.9 C) (Oral)  Resp 25  SpO2 97%  LMP 08/18/1970 Physical Exam  Constitutional: No distress.  HENT:  Head: Normocephalic and atraumatic.  Neck: Normal range of motion. Neck supple.  Cardiovascular: Normal rate.   Pulmonary/Chest: Effort normal. No respiratory distress.  Abdominal: There is no tenderness.  Genitourinary: Guaiac negative stool.  Brown stool  Skin: No rash noted. She is not diaphoretic.    ED Course  Procedures (including critical care time) Labs Review Labs Reviewed  COMPREHENSIVE METABOLIC PANEL - Abnormal;  Notable for the following:    Glucose, Bld 225 (*)    Albumin 3.1 (*)    GFR calc non Af Amer 86 (*)    All other components within normal limits  CBC WITH DIFFERENTIAL/PLATELET  POC OCCULT BLOOD, ED    Imaging Review Mr Kizzie Fantasia Contrast  10/25/2014   CLINICAL DATA:  Seizure activity. Recent MRI demonstrating acute RIGHT inferior thalamic infarct, and profound change in mental status. Assess recurrent seizure. History of diabetes and hypertension.  EXAM: MRI HEAD WITHOUT AND WITH CONTRAST  TECHNIQUE: Multiplanar, multiecho pulse sequences of the brain and surrounding structures were obtained without and with intravenous contrast.  CONTRAST:  15mL MULTIHANCE GADOBENATE DIMEGLUMINE 529 MG/ML IV SOLN  COMPARISON:  MRI of the brain October 21, 2014  FINDINGS: Sub cm focus of reduced diffusion in RIGHT hypothalamus, relatively normalized ADC value. No new areas or reduced diffusion. No susceptibility artifact to suggest hemorrhage.  Moderate ventriculomegaly, Mild disproportionate sulcal effacement at the convexities. Patchy to confluent supratentorial and pontine white matter T2 hyperintensities. Remote RIGHT pontine subcentimeter infarct. No midline shift, mass effect, mass lesions. Prominent LEFT insular cerebral artery vessel most consistent with tortuous vessel as no aneurysm was identified on recent MRA.  Status post bilateral ocular lens implants. Paranasal sinuses and mastoid air cells are well aerated. No abnormal sellar expansion. No cerebellar tonsillar ectopia. No suspicious calvarial bone marrow signal.  The due to mild motion, limited assessment of the hippocampi, which demonstrate grossly symmetric morphology, signal and size.  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  No acute ischemia.  Findings of normal pressure hydrocephalus.  Moderate white matter changes suggest chronic small vessel ischemic disease. Remote sub cm RIGHT pontine infarct.  No abnormal intracranial  enhancement.   Electronically Signed   By: Elon Alas   On: 10/25/2014 05:15     EKG Interpretation None      MDM   Final diagnoses:  None   74 y/o female D/c to SNF yesterday after admission for CVA.  D/c on ASA but no other blood thinners who p/w w/ two small volume stools that had bright red blood clots in them.  On exam here, stool is brown, guaiac negative.  VSS.    Labs obtained and show hgb 12.7.    Given guaiac neg stool and normal hgb w/ VS WNL.  Doubt dangerous GI bleed.  Feel safe for d/c w/ outpatient follow up.  I have discussed the results, Dx and Tx plan with the patient. They expressed understanding and agree with the plan and were told to return to ED with any worsening of condition or concern.    Disposition: Discharge  Condition: Good  Discharge Medication List as of 10/26/2014  8:47 PM      Follow Up: [facility] [address] [id number] [place] [phone]  If symptoms worsen  Debbrah Alar, NP [place]  [address] [place] [address] [phone]  In 1 week    Pt seen in conjunction with Dr. Alecia Lemming, MD 10/27/14 [place], MD 10/31/14 [phone]

## 2014-10-26 NOTE — Telephone Encounter (Signed)
Please contact pt to arrange hospital follow up. 

## 2014-10-26 NOTE — ED Notes (Signed)
[facility]- pt coming from nursing home. New to facility, just admitted last night. Staff reports 2 bloody stools today, no other complaints. Vital signs stable.

## 2014-10-26 NOTE — Discharge Instructions (Signed)
Bloody Stools °Bloody stools means there is blood in your poop (stool). It is a sign that there is a problem somewhere in the digestive system. It is important for your doctor to find the cause of your bleeding, so the problem can be treated.  °HOME CARE °· Only take medicine as told by your doctor. °· Eat foods with fiber (prunes, bran cereals). °· Drink enough fluids to keep your pee (urine) clear or pale yellow. °· Sit in warm water (sitz bath) for 10 to 15 minutes as told by your doctor. °· Know how to take your medicines (enemas, suppositories) if advised by your doctor. °· Watch for signs that you are getting better or getting worse. °GET HELP RIGHT AWAY IF:  °· You are not getting better. °· You start to get better but then get worse again. °· You have new problems. °· You have severe bleeding from the place where poop comes out (rectum) that does not stop. °· You throw up (vomit) blood. °· You feel weak or pass out (faint). °· You have a fever. °MAKE SURE YOU:  °· Understand these instructions. °· Will watch your condition. °· Will get help right away if you are not doing well or get worse. °Document Released: 07/23/2009 Document Revised: 10/27/2011 Document Reviewed: 12/20/2010 °ExitCare® Patient Information ©2015 [employer]. This information is not intended to replace advice given to you by your health care provider. Make sure you discuss any questions you have with your health care provider. ° °

## 2014-10-26 NOTE — ED Notes (Signed)
Report called to michelle at [facility]

## 2014-10-27 ENCOUNTER — Non-Acute Institutional Stay (SKILLED_NURSING_FACILITY): Payer: Medicare HMO | Admitting: Internal Medicine

## 2014-10-27 ENCOUNTER — Telehealth: Payer: Self-pay

## 2014-10-27 DIAGNOSIS — I10 Essential (primary) hypertension: Secondary | ICD-10-CM | POA: Diagnosis not present

## 2014-10-27 DIAGNOSIS — E1165 Type 2 diabetes mellitus with hyperglycemia: Secondary | ICD-10-CM

## 2014-10-27 DIAGNOSIS — E785 Hyperlipidemia, unspecified: Secondary | ICD-10-CM

## 2014-10-27 DIAGNOSIS — K625 Hemorrhage of anus and rectum: Secondary | ICD-10-CM

## 2014-10-27 DIAGNOSIS — R531 Weakness: Secondary | ICD-10-CM | POA: Diagnosis not present

## 2014-10-27 DIAGNOSIS — G40309 Generalized idiopathic epilepsy and epileptic syndromes, not intractable, without status epilepticus: Secondary | ICD-10-CM | POA: Diagnosis not present

## 2014-10-27 DIAGNOSIS — I639 Cerebral infarction, unspecified: Secondary | ICD-10-CM | POA: Diagnosis not present

## 2014-10-27 DIAGNOSIS — K219 Gastro-esophageal reflux disease without esophagitis: Secondary | ICD-10-CM

## 2014-10-27 DIAGNOSIS — K5900 Constipation, unspecified: Secondary | ICD-10-CM | POA: Diagnosis not present

## 2014-10-27 DIAGNOSIS — G40901 Epilepsy, unspecified, not intractable, with status epilepticus: Secondary | ICD-10-CM

## 2014-10-27 DIAGNOSIS — IMO0001 Reserved for inherently not codable concepts without codable children: Secondary | ICD-10-CM

## 2014-10-27 NOTE — Telephone Encounter (Signed)
Phone busy.  Will try to call again later.

## 2014-10-28 LAB — CULTURE, BLOOD (ROUTINE X 2): Culture: NO GROWTH

## 2014-10-28 NOTE — Progress Notes (Signed)
Patient ID: April [name], female   DOB: May 24, 1941, 74 y.o.   MRN: [id number]     [facility]   PCP: Nance Pear., NP  Code Status: full code  Allergies  Allergen Reactions  . Codeine Nausea And Vomiting  . Sulfa Antibiotics Nausea And Vomiting    Chief Complaint  Patient presents with  . New Admit To SNF     HPI:  74 year old patient is here for short term rehabilitation post hospital admission from 10/21/14-10/25/14 with altered mental status, speech and gait disturbances. She was diagnosed to have infarct in right thalamus. Neurology was consulted. She was put on aspirin for secondary prevention and dysphagia diet. She also had seizure and her keppra dose was increased. There was concern for gram positive sepsis but later was thought to be contaminant. She has pmh of dm type 24m gerd, HTN, HLD, seizure disorder She is seen in her room today. She denies any concerns. She is alert and oriented to person and place. She was sent to the ED yesterday with concerns of rectal bleed. No active bleed was noted, hemeoccult was negative. Her hb was stable. She was sent back to the facility with outpatient follow up. As per pt last bowel movement 2 days back  Review of Systems:  Constitutional: Negative for fever, chills, diaphoresis.  HENT: Negative for headache, congestion Eyes: Negative for eye pain, blurred vision, double vision and discharge.  Respiratory: Negative for cough, shortness of breath and wheezing.   Cardiovascular: Negative for chest pain, palpitations, leg swelling.  Gastrointestinal: Negative for heartburn, nausea, vomiting, abdominal pain Genitourinary: Negative for dysuria  Musculoskeletal: Negative for back pain, falls Skin: Negative for itching, rash.  Neurological: Negative for dizziness, tingling Psychiatric/Behavioral: Negative for depression. Has some confusion   Past Medical History  Diagnosis Date  . Diabetes  mellitus   . Hypertension   . Retinopathy 06/06/2013    Per eye exam 05/31/13 eye exam, Moderate retinopathy   . Aphasia   . Seizures    Past Surgical History  Procedure Laterality Date  . Appendectomy  1972  . Abdominal hysterectomy  1972  . Tee without cardioversion N/A 10/24/2014    Procedure: TRANSESOPHAGEAL ECHOCARDIOGRAM (TEE);  Surgeon: Dorothy Spark, MD;  Location: [facility];  Service: Cardiovascular;  Laterality: N/A;   Social History:   reports that she has never smoked. She has never used smokeless tobacco. She reports that she does not drink alcohol or use illicit drugs.  Family History  Problem Relation Age of Onset  . Arthritis Maternal Grandmother   . Heart disease Maternal Grandmother 80    Medications: Patient's Medications  New Prescriptions   No medications on file  Previous Medications   AMLODIPINE (NORVASC) 10 MG TABLET    Take 1 tablet (10 mg total) by mouth daily.   ASPIRIN EC 325 MG TABLET    Take 1 tablet (325 mg total) by mouth daily.   ATORVASTATIN (LIPITOR) 20 MG TABLET    Take 1 tablet (20 mg total) by mouth daily at 6 PM.   CARVEDILOL (COREG) 25 MG TABLET    Take 1 tablet (25 mg total) by mouth 2 (two) times daily with a meal.   GLIMEPIRIDE (AMARYL) 2 MG TABLET    Take 1 tablet (2 mg total) by mouth daily with breakfast.   INSULIN DETEMIR (LEVEMIR) 100 UNIT/ML INJECTION    Inject 0.25 mLs (25 Units total) into the skin daily.   ISOSORBIDE-HYDRALAZINE (  BIDIL) 20-37.5 MG PER TABLET    Take 1 tablet by mouth 3 (three) times daily.   LEVETIRACETAM (KEPPRA) 500 MG TABLET    Take 1 tablet (500 mg total) by mouth 2 (two) times daily.   ONDANSETRON (ZOFRAN ODT) 8 MG DISINTEGRATING TABLET    Take 1 tablet (8 mg total) by mouth every 8 (eight) hours as needed for nausea or vomiting.   PANTOPRAZOLE (PROTONIX) 40 MG TABLET    Take 1 tablet (40 mg total) by mouth daily.   POTASSIUM CHLORIDE SA (K-DUR,KLOR-CON) 20 MEQ TABLET    Take 2 tablets (40 mEq total)  by mouth daily.  Modified Medications   No medications on file  Discontinued Medications   No medications on file     Physical Exam: Filed Vitals:   10/27/14 2320  BP: 121/64  Pulse: 73  Temp: 97.4 F (36.3 C)  Resp: 18  SpO2: 95%    General- elderly female, in no acute distress Head- normocephalic, atraumatic Throat- moist mucus membrane Neck- no cervical lymphadenopathy Cardiovascular- normal s1,s2, no murmurs, palpable dorsalis pedis and radial pulses, no leg edema Respiratory- bilateral clear to auscultation, no wheeze, no rhonchi, no crackles, no use of accessory muscles Abdomen- bowel sounds present, soft, non tender Musculoskeletal- able to move all 4 extremities, generalized weakness  Neurological- alert and oriented to person and place  Skin- warm and dry Psychiatry- normal mood and affect    Labs reviewed: Basic Metabolic Panel:  Recent Labs  10/22/14 1337 10/23/14 0737 10/24/14 0752 10/26/14 1948  NA 139 140 140 136  K 2.6* 2.9* 4.4 5.1  CL 94* 101 106 101  CO2 36* 32 28 25  GLUCOSE 177* 236* 234* 225*  BUN 14 9 5* 14  CREATININE 0.58 0.46* 0.47* 0.65  CALCIUM 8.9 8.4 8.7 9.3  MG 1.9  --   --   --    Liver Function Tests:  Recent Labs  10/22/14 1337 10/23/14 0737 10/26/14 1948  AST 18 17 15   ALT 13 14 13   ALKPHOS 67 61 56  BILITOT 0.9 0.7 0.8  PROT 7.2 6.2 6.2  ALBUMIN 3.4* 2.8* 3.1*   No results for input(s): LIPASE, AMYLASE in the last 8760 hours. No results for input(s): AMMONIA in the last 8760 hours. CBC:  Recent Labs  10/21/14 1625 10/23/14 0737 10/24/14 0752 10/26/14 1948  WBC 12.4* 7.8 11.0* 10.4  NEUTROABS 7.2  --   --  7.2  HGB 15.6* 13.7 13.0 12.7  HCT 44.1 39.1 38.0 37.9  MCV 90.4 91.1 92.0 93.3  PLT 439* 406* 406* 386   Cardiac Enzymes:  Recent Labs  10/22/14 1337 10/22/14 1810 10/23/14 0059  TROPONINI <0.03 <0.03 <0.03   BNP: Invalid input(s): POCBNP CBG:  Recent Labs  10/25/14 0646  10/25/14 1301 10/25/14 1649  GLUCAP 141* 248* 173*    Imagine/ tests :   TEE : no ASD, no PFO, normal bubble study  2-D echo: - Left ventricle: The cavity size was normal. Systolic function was   normal. The estimated ejection fraction was in the range of 55%   to 60%. Wall motion was normal; there were no regional wall   motion abnormalities. Doppler parameters are consistent with   abnormal left ventricular relaxation (grade 1 diastolic   dysfunction). Doppler parameters are consistent with elevated   ventricular end-diastolic filling pressure. - Aortic valve: Trileaflet; mildly thickened, mildly calcified   leaflets. There was no regurgitation. - Aortic root: The aortic root was normal  in size. - Mitral valve: Calcified annulus. There was mild regurgitation. - Right ventricle: The cavity size was normal. Wall thickness was   normal. Systolic function was normal. - Right atrium: The atrium was normal in size. - Tricuspid valve: Structurally normal valve. There was trivial   regurgitation. - Pulmonic valve: Structurally normal valve. - Pulmonary arteries: Systolic pressure was within the normal   range. - Inferior vena cava: The vessel was normal in size. - Pericardium, extracardiac: A trivial pericardial effusion was   identified.  Carotid duplex: -BIlateral: minimal plaque noted. 1-39% ICA stenosis. Vertebral artery flow is antegrade. ICA/CCA ratio: R-0.69 L-0.76.  EEG 3/7: positive for epileptic abnormalities appreciated on exam  EEG 3/9: after keppra dose adjusted; no further epileptic abnormalities seen   Assessment/Plan  Generalized weakness Post stroke and seizure. Will have her work with physical therapy and occupational therapy team to help with gait training and muscle strengthening exercises.fall precautions. Skin care. Encourage to be out of bed.  Stroke Right thalamic stroke, continue aspirin with lipitor. Continue bp medication adn treatment for  diabetes. To work with therapy on gait training and muscle strengthening exercises. Fall precautions. Continue dysphagia 3 diet, to follow with SLP team and upgrade diet as tolerated  Dm type 2 Monitor cbg. Continue glimepiride 2 mg daily, levemir 25 mg daily.  HTN bp reading stable, continue amlodipine 10 mg daily, coreg 25 mg bid and bidil 20-37.5 mg tid. Continue to monitor bp readings  HLD Continue lipitor 20 mg daily  Constipation Add colace 100 mg daily  Rectal bleed Guaiac test negative and hb stable. Possible hemorrhoidal bleed. Added stool softener. Monitor clinically  seizure  Continue keppra 500 mg bid, seizure precuations  gerd Continue protonix, symptoms controlled    Goals of care: short term rehabilitation   Labs/tests ordered: cbc with diff, cmp in 1 week  Family/ staff Communication: reviewed care plan with patient and nursing supervisor    Blanchie Serve, MD  [place] [phone] (Monday-Friday 8 am - 5 pm) [phone] (afterhours)

## 2014-11-01 NOTE — Telephone Encounter (Signed)
Per chart, pt returned to the ER on 10/26/14 and was placed in SNF on 10/27/14.

## 2014-12-07 ENCOUNTER — Ambulatory Visit: Payer: Medicare HMO | Admitting: Neurology

## 2014-12-14 ENCOUNTER — Telehealth: Payer: Self-pay | Admitting: Family

## 2014-12-14 NOTE — Telephone Encounter (Signed)
Caller name: cindy Relation to pt: niece Call back number: [phone] Pharmacy:  Reason for call:   Requesting a written rx for a wheelchair for patient. Patient is currently at [facility] and will be discharged either this Sunday or Monday.

## 2014-12-15 ENCOUNTER — Encounter: Payer: Self-pay | Admitting: Neurology

## 2014-12-15 ENCOUNTER — Ambulatory Visit (INDEPENDENT_AMBULATORY_CARE_PROVIDER_SITE_OTHER): Payer: Medicare HMO | Admitting: Neurology

## 2014-12-15 VITALS — BP 127/70 | HR 70 | Ht 59.0 in | Wt 130.0 lb

## 2014-12-15 DIAGNOSIS — R569 Unspecified convulsions: Secondary | ICD-10-CM

## 2014-12-15 DIAGNOSIS — R413 Other amnesia: Secondary | ICD-10-CM

## 2014-12-15 DIAGNOSIS — I639 Cerebral infarction, unspecified: Secondary | ICD-10-CM

## 2014-12-15 DIAGNOSIS — R269 Unspecified abnormalities of gait and mobility: Secondary | ICD-10-CM

## 2014-12-15 NOTE — Progress Notes (Signed)
PATIENT: April [name] DOB: [birth year]  HISTORICAL  ROSITA GUZZETTA is a 74 years old female, referred by her primary care nurse practitioner Debbrah Alar, accompanied by her niece Meriel Flavors, who is in this process of become her power of attorney, and her niece Zigmund Daniel at today's evaluation,  She had past medical history of hypertension, hyperlipidemia, diabetes, most recent A1c was 10.7, she was admitted to the hospital March 5 to October 25 2014, after she was found by her neighbor that she is confused  Had extensive evaluation during her hospital stay, EEG October 23 2014 showed buildup of occipital spike and slow wave activity from the left  occipital region. It achieves a frequency of 3-4 Hz with some spread noted to the right hemisphere as well. These episodes last from 100 to 155 seconds with normal awake background  activity returning after these periods. Suggestive of subclinical seizure, she was put on Keppra 500 mg twice a day, repeat EEG October 25 2014, showed significant improvement  Echocardiogram ejection fraction 60%, ultrasound of carotid artery showed less than 39% stenosis, anterograde flow of bilateral vertebral system   I have reviewed MRI of brain, MRA of the brain October 21 2014, MRI of the brain October 23 2014: 5 mm focus of restricted diffusion within the inferior right thalamus as above, highly suspicious for a small acute ischemic  infarct. Generalized cerebral atrophy with moderate chronic microvascular ischemic disease. There was no significant medium large size vessel disease by MRA of the brain, Laboratory evaluation showed  LDL 46, A1c 10.7, normal vitamin B12, TSH,   Before she was admitted to the hospital, she lives by herself, has widowed for many years, has no relationship to her only son, was still driving, but he was noticed by her nieces, who intact with her frequently, she has gradual onset memory trouble, also moody, tends to cry, she has been  missing her diabetic, hypertension, hyperlipidemia medication regularly,   Since hospital admission, she was discharged to [facility], continue has intermittent memory loss, mild confusion, in addition, she was noted to have increased gait difficulty, urinary urgency, occasionally bladder accident, constipation, with laxative, she occasionally has bowel incontinence, contributed to not able to make to the bathroom by Asst. in timely fashion,   She is tearful emotional during today's interview, she will be moved to a long-term assisted living, her niece is in the process of getting power of attorney   REVIEW OF SYSTEMS: Full 14 system review of systems performed and notable only for chills, unexpected weight changes, hearing loss, ringing ears, eye itching, light sensitivity, loss of vision, leg swelling, cold intolerance, restless leg, back pain, walking difficulties, confusion, depression   ALLERGIES: Allergies  Allergen Reactions  . Codeine Nausea And Vomiting  . Sulfa Antibiotics Nausea And Vomiting    HOME MEDICATIONS: Current Outpatient Prescriptions  Medication Sig Dispense Refill  . amLODipine (NORVASC) 10 MG tablet Take 1 tablet (10 mg total) by mouth daily.    Marland Kitchen aspirin EC 325 MG tablet Take 1 tablet (325 mg total) by mouth daily. 30 tablet 0  . atorvastatin (LIPITOR) 20 MG tablet Take 1 tablet (20 mg total) by mouth daily at 6 PM.    . carvedilol (COREG) 25 MG tablet Take 1 tablet (25 mg total) by mouth 2 (two) times daily with a meal.    . docusate sodium (COLACE) 100 MG capsule Take by mouth. Take 1 capsule by mouth daily for constipation    .  glimepiride (AMARYL) 2 MG tablet Take 1 tablet (2 mg total) by mouth daily with breakfast.    . insulin detemir (LEVEMIR) 100 UNIT/ML injection Inject 0.25 mLs (25 Units total) into the skin daily. 10 mL 11  . isosorbide dinitrate (ISORDIL) 20 MG tablet Take by mouth 3 (three) times daily. Take 1 tablet by mouth three times a day    .  isosorbide-hydrALAZINE (BIDIL) 20-37.5 MG per tablet Take 1 tablet by mouth 3 (three) times daily.    Marland Kitchen levETIRAcetam (KEPPRA) 500 MG tablet Take 1 tablet (500 mg total) by mouth 2 (two) times daily.    . ondansetron (ZOFRAN ODT) 8 MG disintegrating tablet Take 1 tablet (8 mg total) by mouth every 8 (eight) hours as needed for nausea or vomiting. 20 tablet 0  . pantoprazole (PROTONIX) 40 MG tablet Take 1 tablet (40 mg total) by mouth daily.    . potassium chloride SA (K-DUR,KLOR-CON) 20 MEQ tablet Take 2 tablets (40 mEq total) by mouth daily.     No current facility-administered medications for this visit.    PAST MEDICAL HISTORY: Past Medical History  Diagnosis Date  . Diabetes mellitus   . Hypertension   . Retinopathy 06/06/2013    Per eye exam 05/31/13 eye exam, Moderate retinopathy   . Aphasia   . Seizures   . Rectum bleeding   . Stroke     PAST SURGICAL HISTORY: Past Surgical History  Procedure Laterality Date  . Appendectomy  18-Nov-1970  . Abdominal hysterectomy  11-18-1970  . Tee without cardioversion N/A 10/24/2014    Procedure: TRANSESOPHAGEAL ECHOCARDIOGRAM (TEE);  Surgeon: Dorothy Spark, MD;  Location: [facility] ENDOSCOPY;  Service: Cardiovascular;  Laterality: N/A;    FAMILY HISTORY: Family History  Problem Relation Age of Onset  . Arthritis Maternal Grandmother   . Heart disease Maternal Grandmother 1978/11/18  . Diabetes Mother   . Stomach cancer Mother     SOCIAL HISTORY:  History   Social History  . Marital Status: Widowed    Spouse Name: N/A  . Number of Children: 1  . Years of Education: 13   Occupational History  . Retired    Social History Main Topics  . Smoking status: Never Smoker   . Smokeless tobacco: Never Used  . Alcohol Use: No  . Drug Use: No  . Sexual Activity: Not on file   Other Topics Concern  . Not on file   Social History Narrative   Widowed, husband died in 11/17/01.   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   Right-handed.   1 cup caffeine per day.   She is being cared for by her two neices.        PHYSICAL EXAM   Filed Vitals:   12/15/14 1041  BP: 127/70  Pulse: 70  Height: 4\' 11"  (1.499 m)  Weight: 130 lb (58.968 kg)    Not recorded      Body mass index is 26.24 kg/(m^2).  PHYSICAL EXAMNIATION:  Gen: NAD, conversant, well nourised, obese, well groomed                     Cardiovascular: Regular rate rhythm, no peripheral edema, warm, nontender. Eyes: Conjunctivae clear without exudates or hemorrhage Neck: Supple, no carotid bruise. Pulmonary: Clear to auscultation  bilaterally   NEUROLOGICAL EXAM:  MENTAL STATUS: Speech:    Speech is normal; fluent and spontaneous with normal comprehension.  Cognition:Mini-Mental Status Examination is 30 out of 30     The patient is oriented to person, place, and time;     recent and remote memory intact;     language fluent;     normal attention, concentration,     fund of knowledge.  CRANIAL NERVES: CN II: Visual fields are full to confrontation. Fundoscopic exam is normal with sharp discs and no vascular changes. Venous pulsations are present bilaterally. Pupils are 4 mm and briskly reactive to light. Visual acuity is 20/20 bilaterally. CN III, IV, VI: extraocular movement are normal. No ptosis. CN V: Facial sensation is intact to pinprick in all 3 divisions bilaterally. Corneal responses are intact.  CN VII: Face is symmetric with normal eye closure and smile. CN VIII: Hearing is normal to rubbing fingers CN IX, X: Palate elevates symmetrically. Phonation is normal. CN XI: Head turning and shoulder shrug are intact CN XII: Tongue is midline with normal movements and no atrophy.  MOTOR: There is no pronator drift of out-stretched arms. Muscle bulk and tone are normal. Muscle strength is normal.    Shoulder abduction Shoulder external rotation Elbow flexion Elbow extension Wrist flexion Wrist extension Finger abduction Hip flexion Knee flexion Knee extension Ankle dorsi flexion Ankle plantar flexion  R 5 5 5 5 5 5 5 5 5 5  4 5   L 5 5 5 5 5 5 5 5 5 5  4 5     REFLEXES: Reflexes are 2+ and symmetric at the biceps, triceps, knees, and ankles. Plantar responses are flexor.  SENSORY: Bilateral lower extremity pitting edema, mildly length dependent decreased pinprick, vibratory sensation at toes,  Coordination : Rapid alternating movements and fine finger movements are intact. There is no dysmetria on finger-to-nose and heel-knee-shin. There are no abnormal or extraneous movements.   GAIT/STANCE: She needs assistance to get up from seated position, with bilateral foot drop,    DIAGNOSTIC DATA (LABS, IMAGING, TESTING) - I reviewed patient records, labs, notes, testing and imaging myself where available.  Lab Results  Component Value Date   WBC 10.4 10/26/2014   HGB 12.7 10/26/2014   HCT 37.9 10/26/2014   MCV 93.3 10/26/2014   PLT 386 10/26/2014      Component Value Date/Time   NA 136 10/26/2014 1948   K 5.1 10/26/2014 1948   CL 101 10/26/2014 1948   CO2 25 10/26/2014 1948   GLUCOSE 225* 10/26/2014 1948   BUN 14 10/26/2014 1948   CREATININE 0.65 10/26/2014 1948   CREATININE 0.52 05/17/2013 1144   CALCIUM 9.3 10/26/2014 1948   PROT 6.2 10/26/2014 1948   ALBUMIN 3.1* 10/26/2014 1948   AST 15 10/26/2014 1948   ALT 13 10/26/2014 1948   ALKPHOS 56 10/26/2014 1948   BILITOT 0.8 10/26/2014 1948   GFRNONAA 86* 10/26/2014 1948   GFRAA >90 10/26/2014 1948   Lab Results  Component Value Date   CHOL 169 10/22/2014   HDL 28* 10/22/2014   LDLCALC 105* 10/22/2014   TRIG 178* 10/22/2014   CHOLHDL 6.0 10/22/2014   Lab Results  Component Value Date   HGBA1C 10.1* 10/22/2014   Lab Results  Component Value Date   VITAMINB12 605 10/22/2014   Lab Results  Component Value Date    TSH 1.533 10/22/2014      ASSESSMENT AND PLAN  Maxx Calaway Mcginnis is a 74 y.o.  female   with multiple vascular risk factor, hypertension, diabetes, poorly controlled, hyperlipidemia, presenting with gradual onset gait difficulty, confusion episodes, consistent with complex partial seizure, abnormal EEGs, mild memory trouble   1, cognitive impairment, her niece is in the process of getting power of attorney, will refer patient to neuropsychiatric evaluation for baseline 2. Partial seizure, abnormal EEG, abnormal MRI of the brain, moderate atrophy, periventricular small vessel disease, continue Keppra 500 mg twice a day  3. Gait difficulty, hyperreflexia, distal weakness, numbness dependent sensory changes, differentiation diagnosis including lumbar stenosis, cervical spondylitic myelopathy proceed with MRI of cervical, and MRI of lumbar spine  4. Acute right thalamic stroke, small vessel disease, keep aspirin 325 mg daily 5. Return to clinic in one month   Marcial Pacas, M.D. Ph.D.  [facility] [address], [place], [place] [address] Ph: [phone] Fax: [phone]

## 2014-12-15 NOTE — Telephone Encounter (Signed)
Please advise 

## 2014-12-16 ENCOUNTER — Non-Acute Institutional Stay (SKILLED_NURSING_FACILITY): Payer: Medicare HMO | Admitting: Adult Health

## 2014-12-16 ENCOUNTER — Encounter: Payer: Self-pay | Admitting: Adult Health

## 2014-12-16 DIAGNOSIS — I639 Cerebral infarction, unspecified: Secondary | ICD-10-CM | POA: Diagnosis not present

## 2014-12-16 DIAGNOSIS — IMO0001 Reserved for inherently not codable concepts without codable children: Secondary | ICD-10-CM

## 2014-12-16 DIAGNOSIS — K219 Gastro-esophageal reflux disease without esophagitis: Secondary | ICD-10-CM | POA: Diagnosis not present

## 2014-12-16 DIAGNOSIS — E1165 Type 2 diabetes mellitus with hyperglycemia: Secondary | ICD-10-CM | POA: Diagnosis not present

## 2014-12-16 DIAGNOSIS — G40309 Generalized idiopathic epilepsy and epileptic syndromes, not intractable, without status epilepticus: Secondary | ICD-10-CM | POA: Diagnosis not present

## 2014-12-16 DIAGNOSIS — E876 Hypokalemia: Secondary | ICD-10-CM

## 2014-12-16 DIAGNOSIS — I10 Essential (primary) hypertension: Secondary | ICD-10-CM

## 2014-12-16 DIAGNOSIS — G40901 Epilepsy, unspecified, not intractable, with status epilepticus: Secondary | ICD-10-CM

## 2014-12-16 DIAGNOSIS — K5900 Constipation, unspecified: Secondary | ICD-10-CM | POA: Diagnosis not present

## 2014-12-16 DIAGNOSIS — E43 Unspecified severe protein-calorie malnutrition: Secondary | ICD-10-CM | POA: Diagnosis not present

## 2014-12-16 DIAGNOSIS — R531 Weakness: Secondary | ICD-10-CM

## 2014-12-16 DIAGNOSIS — E785 Hyperlipidemia, unspecified: Secondary | ICD-10-CM | POA: Diagnosis not present

## 2014-12-16 NOTE — Progress Notes (Signed)
Patient ID: April [name], female   DOB: 03-14-1941, 74 y.o.   MRN: [id number]   12/16/2014  Facility:  Nursing Home Location:  [place] Room Number: [phone] LEVEL OF CARE:  SNF (31)   Chief Complaint  Patient presents with  . Medical Management of Chronic Issues    Generalized weakness, stroke, seizure, hypertension, GERD, hyperlipidemia, diabetes mellitus, constipation, hypokalemia, and protein calorie malnutrition    HISTORY OF PRESENT ILLNESS:  This is a 74 year old female who was been admitted to [facility] on 10/25/14 from [facility]. She has PMH of hypertension, hyperlipidemia, diabetes mellitus and GERD. She was brought to the ED by her knee for one note is patient to have confusion, difficulty walking and acting differently. She was diagnosed to have infarct in right thalamus. Neurology was consulted and patient was started on aspirin. She was also diagnosed with seizure and started on Keppra.  She was recently treated for pneumonia with Rocephin.  She has been admitted for a short-term rehabilitation.  PAST MEDICAL HISTORY:  Past Medical History  Diagnosis Date  . Diabetes mellitus   . Hypertension   . Retinopathy 06/06/2013    Per eye exam 05/31/13 eye exam, Moderate retinopathy   . Aphasia   . Seizures   . Rectum bleeding   . Stroke     CURRENT MEDICATIONS: Reviewed per MAR/see medication list  Allergies  Allergen Reactions  . Codeine Nausea And Vomiting  . Sulfa Antibiotics Nausea And Vomiting     REVIEW OF SYSTEMS:  GENERAL: no change in appetite, no fatigue, no weight changes, no fever, chills or weakness RESPIRATORY: no cough, SOB, DOE, wheezing, hemoptysis CARDIAC: no chest pain, edema or palpitations GI: no abdominal pain, diarrhea, constipation, heart burn, nausea or vomiting  PHYSICAL EXAMINATION  GENERAL: no acute distress, normal body habitus EYES: conjunctivae normal, sclerae normal, normal eye  lids NECK: supple, trachea midline, no neck masses, no thyroid tenderness, no thyromegaly LYMPHATICS: no LAN in the neck, no supraclavicular LAN RESPIRATORY: breathing is even & unlabored, BS CTAB CARDIAC: RRR, no murmur,no extra heart sounds, no edema GI: abdomen soft, normal BS, no masses, no tenderness, no hepatomegaly, no splenomegaly EXTREMITIES: Able to move 4 extremities; has generalized weakness; walks with walker  PSYCHIATRIC: the patient is alert & oriented to person, affect & behavior appropriate  LABS/RADIOLOGY: Labs reviewed: Basic Metabolic Panel:  Recent Labs  10/22/14 1337 10/23/14 0737 10/24/14 0752 10/26/14 1948  NA 139 140 140 136  K 2.6* 2.9* 4.4 5.1  CL 94* 101 106 101  CO2 36* 32 28 25  GLUCOSE 177* 236* 234* 225*  BUN 14 9 5* 14  CREATININE 0.58 0.46* 0.47* 0.65  CALCIUM 8.9 8.4 8.7 9.3  MG 1.9  --   --   --    Liver Function Tests:  Recent Labs  10/22/14 1337 10/23/14 0737 10/26/14 1948  AST 18 17 15   ALT 13 14 13   ALKPHOS 67 61 56  BILITOT 0.9 0.7 0.8  PROT 7.2 6.2 6.2  ALBUMIN 3.4* 2.8* 3.1*   CBC:  Recent Labs  10/21/14 1625 10/23/14 0737 10/24/14 0752 10/26/14 1948  WBC 12.4* 7.8 11.0* 10.4  NEUTROABS 7.2  --   --  7.2  HGB 15.6* 13.7 13.0 12.7  HCT 44.1 39.1 38.0 37.9  MCV 90.4 91.1 92.0 93.3  PLT 439* 406* 406* 386   Lipid Panel:  Recent Labs  10/22/14 0549  HDL 28*   Cardiac  Enzymes:  Recent Labs  10/22/14 1337 10/22/14 1810 10/23/14 0059  TROPONINI <0.03 <0.03 <0.03   CBG:  Recent Labs  10/25/14 0646 10/25/14 1301 10/25/14 1649  GLUCAP 141* 248* 173*    ASSESSMENT/PLAN:  Generalized weakness - for rehabilitation Stroke - continue aspirin 325 mg by mouth daily Seizure -  Continue Keppra 500 mg by mouth twice a day Hypertension - well controlled; continue Norvasc 10 mg by mouth daily, hydralazine 25 mg take 1-1/2 tab = 37.5 mg by mouth 3 times a day, Isordil 20 mg PO TID and Coreg 25 mg by mouth  twice a day GERD - continue Protonix 40 mg by mouth daily Hyperlipidemia - continue Lipitor 20 mg by mouth daily Diabetes mellitus, type II - hemoglobin A1c 8.6; continue Amaryl 2 mg by mouth daily and Levemir 25 units subcutaneous daily at bedtime Constipation - continue Colace 100 mg by mouth daily Hypokalemia - K4.3; continue K Dur Protein calorie malnutrition - albumin 2.9; RD consult and continue supplementation    Goals of care:  Short-term rehabilitation   Labs/test ordered:  BMP in 1 week     [facility], NP [employer] [phone]

## 2014-12-18 NOTE — Telephone Encounter (Signed)
Notified niece [name]. She states that she spoke with Social worker this morning and they told her that the Rx for wheelchair was included in the [place]. She states she would still like to pick up written Rx just in case. Rx placed at front desk for pick up.

## 2014-12-18 NOTE — Telephone Encounter (Signed)
See rx. 

## 2014-12-19 ENCOUNTER — Encounter: Payer: Self-pay | Admitting: Adult Health

## 2014-12-19 ENCOUNTER — Non-Acute Institutional Stay (SKILLED_NURSING_FACILITY): Payer: Medicare HMO | Admitting: Adult Health

## 2014-12-19 DIAGNOSIS — E785 Hyperlipidemia, unspecified: Secondary | ICD-10-CM | POA: Diagnosis not present

## 2014-12-19 DIAGNOSIS — E876 Hypokalemia: Secondary | ICD-10-CM

## 2014-12-19 DIAGNOSIS — I10 Essential (primary) hypertension: Secondary | ICD-10-CM | POA: Diagnosis not present

## 2014-12-19 DIAGNOSIS — E43 Unspecified severe protein-calorie malnutrition: Secondary | ICD-10-CM

## 2014-12-19 DIAGNOSIS — K5900 Constipation, unspecified: Secondary | ICD-10-CM

## 2014-12-19 DIAGNOSIS — R531 Weakness: Secondary | ICD-10-CM | POA: Diagnosis not present

## 2014-12-19 DIAGNOSIS — I639 Cerebral infarction, unspecified: Secondary | ICD-10-CM | POA: Diagnosis not present

## 2014-12-19 DIAGNOSIS — E1165 Type 2 diabetes mellitus with hyperglycemia: Secondary | ICD-10-CM | POA: Diagnosis not present

## 2014-12-19 DIAGNOSIS — K219 Gastro-esophageal reflux disease without esophagitis: Secondary | ICD-10-CM | POA: Diagnosis not present

## 2014-12-19 DIAGNOSIS — G40901 Epilepsy, unspecified, not intractable, with status epilepticus: Secondary | ICD-10-CM

## 2014-12-19 DIAGNOSIS — IMO0001 Reserved for inherently not codable concepts without codable children: Secondary | ICD-10-CM

## 2014-12-19 DIAGNOSIS — G40309 Generalized idiopathic epilepsy and epileptic syndromes, not intractable, without status epilepticus: Secondary | ICD-10-CM

## 2014-12-19 NOTE — Progress Notes (Signed)
Patient ID: April [name], female   DOB: 1941/05/16, 74 y.o.   MRN: [id number]   12/19/2014  Facility:  Nursing Home Location:  [place] Room Number: [phone] LEVEL OF CARE:  SNF (31)   Chief Complaint  Patient presents with  . Discharge Note    Generalized weakness, stroke, seizure, hypertension, GERD, hyperlipidemia, diabetes mellitus, constipation, hypokalemia, and protein calorie malnutrition    HISTORY OF PRESENT ILLNESS:  This is a 74 year old female who is for discharge to ALF. She has been admitted to [facility] on 10/25/14 from [facility]. She has PMH of hypertension, hyperlipidemia, diabetes mellitus and GERD. She was brought to the ED by her neighbor who noted patient to have confusion, difficulty walking and acting differently. She was diagnosed to have infarct in right thalamus. Neurology was consulted and patient was started on aspirin. She was also diagnosed with seizure and started on Keppra.  She was recently treated for pneumonia with Rocephin.  Patient was admitted to this facility for short-term rehabilitation after the patient's recent hospitalization.  Patient has completed SNF rehabilitation and therapy has cleared the patient for discharge.   PAST MEDICAL HISTORY:  Past Medical History  Diagnosis Date  . Diabetes mellitus   . Hypertension   . Retinopathy 06/06/2013    Per eye exam 05/31/13 eye exam, Moderate retinopathy   . Aphasia   . Seizures   . Rectum bleeding   . Stroke     CURRENT MEDICATIONS: Reviewed per MAR/see medication list  Allergies  Allergen Reactions  . Codeine Nausea And Vomiting  . Sulfa Antibiotics Nausea And Vomiting     REVIEW OF SYSTEMS:  GENERAL: no change in appetite, no fatigue, no weight changes, no fever, chills or weakness RESPIRATORY: no cough, SOB, DOE, wheezing, hemoptysis CARDIAC: no chest pain, edema or palpitations GI: no abdominal pain, diarrhea, constipation, heart  burn, nausea or vomiting  PHYSICAL EXAMINATION  GENERAL: no acute distress, normal body habitus NECK: supple, trachea midline, no neck masses, no thyroid tenderness, no thyromegaly LYMPHATICS: no LAN in the neck, no supraclavicular LAN RESPIRATORY: breathing is even & unlabored, BS CTAB CARDIAC: RRR, no murmur,no extra heart sounds, no edema GI: abdomen soft, normal BS, no masses, no tenderness, no hepatomegaly, no splenomegaly EXTREMITIES: Able to move 4 extremities; walks with walker  PSYCHIATRIC: the patient is alert & oriented to person, affect & behavior appropriate  LABS/RADIOLOGY: Labs reviewed: 11/29/14  cholesterol 92 triglycerides 95 HDL 77 LDL 46 total bilirubin 0.3 direct bilirubin 0.1 and direct bilirubin 0.2 alkaline phosphatase 71 SGOT 17 SGPT 16 total protein 5.7 albumin 2.9 sodium 140 potassium 4.3 glucose 65 BUN 14 creatinine 0.50 calcium 8.3 hemoglobin A1c 8.6 11/20/14  chest x-ray shows pneumonia 11/17/14  WBC 12.7 hemoglobin 11.7 hematocrit 34.7 MCV 94.3 sodium 136 potassium 3.8 glucose 225 BUN 20 creatinine 0.59 calcium 7.9 11/03/14  WBC 9.1 hemoglobin 11.7 hematocrit 34.2 MCV 92.4 sodium 139 potassium 3.7 glucose 79 BUN 17 creatinine 0.61 total bilirubin 0.4 alkaline phosphatase 53 SGOT 18 SGPT 12 total protein 5.7 albumin 3.4 calcium 9.2  Basic Metabolic Panel:  Recent Labs  10/22/14 1337 10/23/14 0737 10/24/14 0752 10/26/14 1948  NA 139 140 140 136  K 2.6* 2.9* 4.4 5.1  CL 94* 101 106 101  CO2 36* 32 28 25  GLUCOSE 177* 236* 234* 225*  BUN 14 9 5* 14  CREATININE 0.58 0.46* 0.47* 0.65  CALCIUM 8.9 8.4 8.7 9.3  MG 1.9  --   --   --  Liver Function Tests:  Recent Labs  10/22/14 1337 10/23/14 0737 10/26/14 1948  AST 18 17 15   ALT 13 14 13   ALKPHOS 67 61 56  BILITOT 0.9 0.7 0.8  PROT 7.2 6.2 6.2  ALBUMIN 3.4* 2.8* 3.1*   CBC:  Recent Labs  10/21/14 1625 10/23/14 0737 10/24/14 0752 10/26/14 1948  WBC 12.4* 7.8 11.0* 10.4  NEUTROABS 7.2  --    --  7.2  HGB 15.6* 13.7 13.0 12.7  HCT 44.1 39.1 38.0 37.9  MCV 90.4 91.1 92.0 93.3  PLT 439* 406* 406* 386   Lipid Panel:  Recent Labs  10/22/14 0549  HDL 28*   Cardiac Enzymes:  Recent Labs  10/22/14 1337 10/22/14 1810 10/23/14 0059  TROPONINI <0.03 <0.03 <0.03   CBG:  Recent Labs  10/25/14 0646 10/25/14 1301 10/25/14 1649  GLUCAP 141* 248* 173*    ASSESSMENT/PLAN:  Generalized weakness - had just finished short-term rehabilitation Stroke - continue aspirin 325 mg by mouth daily Seizure -  Continue Keppra 500 mg by mouth twice a day Hypertension - well controlled; continue Norvasc 10 mg by mouth daily, hydralazine 25 mg take 1-1/2 tab = 37.5 mg by mouth 3 times a day, Isordil 20 mg PO TID and Coreg 25 mg by mouth twice a day GERD - continue Protonix 40 mg by mouth daily Hyperlipidemia - continue Lipitor 20 mg by mouth daily Diabetes mellitus, type II - hemoglobin A1c 8.6; continue Amaryl 2 mg by mouth daily and Levemir 25 units subcutaneous daily at bedtime Constipation - continue Colace 100 mg by mouth daily Hypokalemia - K4.3; discontinue K Dur Protein calorie malnutrition - albumin 2.9; continue supplementation    I have filled out patient's discharge paperwork .   Total discharge time: Less than 30 minutes  Discharge time involved coordination of the discharge process with Education officer, museum, nursing staff and therapy department.    [facility], NP [employer] [phone]

## 2014-12-22 ENCOUNTER — Ambulatory Visit: Payer: Medicare Other | Admitting: Family

## 2014-12-26 ENCOUNTER — Telehealth: Payer: Self-pay

## 2014-12-26 NOTE — Telephone Encounter (Signed)
Called [name] to reschedule patient . Patient's DPR niece . [phone]). Cindy handles all apt's . [name] can only bring her aunt to apt's only on  Friday's that's the only day she has off. Dr.Yan is patient's doctor [name] wants to know if patient can switch to Dr.Sethi ? I stated  to [name] I will call her back in 48 hours with an answer . [name] was fine with the process and she understood.  Patient is on Dr.Yan's [place] list for June.  Patient's diagnoses -Stroke , Memory loss, gait .  Please advise me.

## 2014-12-26 NOTE — Telephone Encounter (Signed)
Patient will follow up with Dr. Leonie Man and stay with Dr. Leonie Man patient can only come on Fridays

## 2014-12-26 NOTE — Telephone Encounter (Signed)
Called and left message for [name] to call me back. I will get patient scheduled with Dr. Leonie Man  when she call's back. Patient needs a 30 minute  Apt.

## 2014-12-27 DIAGNOSIS — C189 Malignant neoplasm of colon, unspecified: Secondary | ICD-10-CM | POA: Diagnosis not present

## 2014-12-27 DIAGNOSIS — I5030 Unspecified diastolic (congestive) heart failure: Secondary | ICD-10-CM | POA: Diagnosis not present

## 2014-12-27 DIAGNOSIS — I110 Hypertensive heart disease with heart failure: Secondary | ICD-10-CM | POA: Diagnosis not present

## 2014-12-27 DIAGNOSIS — R69 Illness, unspecified: Secondary | ICD-10-CM | POA: Diagnosis not present

## 2014-12-27 DIAGNOSIS — Z483 Aftercare following surgery for neoplasm: Secondary | ICD-10-CM | POA: Diagnosis not present

## 2014-12-27 DIAGNOSIS — R2689 Other abnormalities of gait and mobility: Secondary | ICD-10-CM | POA: Diagnosis not present

## 2014-12-27 DIAGNOSIS — G9340 Encephalopathy, unspecified: Secondary | ICD-10-CM | POA: Diagnosis not present

## 2014-12-27 DIAGNOSIS — R4701 Aphasia: Secondary | ICD-10-CM | POA: Diagnosis not present

## 2014-12-27 DIAGNOSIS — R339 Retention of urine, unspecified: Secondary | ICD-10-CM | POA: Diagnosis not present

## 2014-12-27 DIAGNOSIS — I63349 Cerebral infarction due to thrombosis of unspecified cerebellar artery: Secondary | ICD-10-CM | POA: Diagnosis not present

## 2014-12-31 ENCOUNTER — Ambulatory Visit
Admission: RE | Admit: 2014-12-31 | Discharge: 2014-12-31 | Disposition: A | Discharge status: Home / Self Care | Payer: Medicare HMO | Source: Ambulatory Visit | Attending: Neurology | Admitting: Neurology

## 2014-12-31 DIAGNOSIS — R269 Unspecified abnormalities of gait and mobility: Secondary | ICD-10-CM | POA: Diagnosis not present

## 2014-12-31 DIAGNOSIS — R569 Unspecified convulsions: Secondary | ICD-10-CM

## 2014-12-31 DIAGNOSIS — R413 Other amnesia: Secondary | ICD-10-CM

## 2015-01-01 ENCOUNTER — Telehealth: Payer: Self-pay | Admitting: *Deleted

## 2015-01-01 NOTE — Telephone Encounter (Signed)
Received call from Equality, CNA at [facility] stating pt's BS now is 450. Was 166 at 7am this morning. Pt's last ate at 11:30 or 12pm. States pt has been sleepy all day and is declining evening meal. Was given glimepiride 2mg  at 8am this morning. Had 25 units of Levemir at 8pm last night. Per verbal from PCP, pt should be taken to the ER for evaluation and treatment. Notified Dusty at [phone].  Called and notified pt's niece, [name] at [phone] and advised her that pt has not been seen by [place] since 2014 and we will need to see her in the office if we continue to treat her. She voiced understanding and scheduled appt for 01/12/15 at 2pm.

## 2015-01-02 ENCOUNTER — Telehealth: Payer: Self-pay | Admitting: Neurology

## 2015-01-02 NOTE — Telephone Encounter (Signed)
[name], pt's niece called/returning Michelle's call regarding pt's results. Please call and advise. [name] can be reached @ [phone]

## 2015-01-02 NOTE — Telephone Encounter (Signed)
April [name]: call patient, MRI cervical and lumbar showed multilevel degenerative disease, more severe at lumbar region, possible impingement on left L5 nerve root, I will review films at their next follow up visit.   IMPRESSION: This is an abnormal MRI of the lumbar spine showing degenerative changes at L3-S1 as described above. The most significant findings are at L4-L5 with there is disc protrusion, facet hypertrophy and left ligamentum flavum hypertrophy with possible synovial cyst. This causes left lateral recess stenosis that could lead to dynamic impingement of the left L5 nerve root. No spinal stenosis is noted. No acute findings are noted.  IMPRESSION: This an abnormal MRI of the cervical spine showing degenerative changes predominantly at C5-C6 where there is bilateral uncovertebral spurring, more disc protrusion and reduced disc height. The neural foramina are just minimally narrowed at that level and there is no nerve root impingement. Milder degenerative changes are noted at C2-C3 and C6-C7. 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

## 2015-01-02 NOTE — Telephone Encounter (Signed)
Pt's niece, [name], on [employer]IP[employer] aware of results.  She is going to make an earlier appt for her.

## 2015-01-02 NOTE — Telephone Encounter (Signed)
Left message for a return call to discuss results. 

## 2015-01-05 ENCOUNTER — Telehealth: Payer: Self-pay | Admitting: Family

## 2015-01-05 NOTE — Telephone Encounter (Signed)
OK with me.

## 2015-01-05 NOTE — Telephone Encounter (Signed)
Pt Niece is asking to transfer her aunt to the [place] office. She is wanting to transfer to [facility]. Will this be okay?

## 2015-01-05 NOTE — Telephone Encounter (Signed)
Ok to switch 

## 2015-01-08 NOTE — Telephone Encounter (Signed)
Ok per [employer] Pls schedule.

## 2015-01-12 ENCOUNTER — Ambulatory Visit: Payer: Medicare HMO | Admitting: Family

## 2015-01-12 NOTE — Telephone Encounter (Signed)
Patient is scheduled   

## 2015-01-16 ENCOUNTER — Other Ambulatory Visit (HOSPITAL_COMMUNITY): Payer: Self-pay

## 2015-01-16 ENCOUNTER — Inpatient Hospital Stay (HOSPITAL_COMMUNITY)
Admission: EM | Admit: 2015-01-16 | Discharge: 2015-01-18 | DRG: 291 | Disposition: A | Discharge status: Home Health | Payer: Medicare HMO | Attending: Internal Medicine | Admitting: Internal Medicine

## 2015-01-16 ENCOUNTER — Encounter (HOSPITAL_COMMUNITY): Payer: Self-pay | Admitting: Emergency Medicine

## 2015-01-16 ENCOUNTER — Observation Stay (HOSPITAL_COMMUNITY)
Admit: 2015-01-16 | Discharge: 2015-01-16 | Disposition: A | Discharge status: Home / Self Care | Payer: Medicare HMO | Attending: Internal Medicine | Admitting: Internal Medicine

## 2015-01-16 ENCOUNTER — Emergency Department (HOSPITAL_COMMUNITY): Payer: Medicare HMO

## 2015-01-16 DIAGNOSIS — Z7982 Long term (current) use of aspirin: Secondary | ICD-10-CM

## 2015-01-16 DIAGNOSIS — R609 Edema, unspecified: Secondary | ICD-10-CM | POA: Diagnosis not present

## 2015-01-16 DIAGNOSIS — Z8249 Family history of ischemic heart disease and other diseases of the circulatory system: Secondary | ICD-10-CM | POA: Diagnosis not present

## 2015-01-16 DIAGNOSIS — E785 Hyperlipidemia, unspecified: Secondary | ICD-10-CM | POA: Diagnosis present

## 2015-01-16 DIAGNOSIS — Z794 Long term (current) use of insulin: Secondary | ICD-10-CM

## 2015-01-16 DIAGNOSIS — I6330 Cerebral infarction due to thrombosis of unspecified cerebral artery: Secondary | ICD-10-CM

## 2015-01-16 DIAGNOSIS — R0600 Dyspnea, unspecified: Secondary | ICD-10-CM | POA: Diagnosis present

## 2015-01-16 DIAGNOSIS — R569 Unspecified convulsions: Secondary | ICD-10-CM | POA: Diagnosis present

## 2015-01-16 DIAGNOSIS — Z90710 Acquired absence of both cervix and uterus: Secondary | ICD-10-CM

## 2015-01-16 DIAGNOSIS — I5033 Acute on chronic diastolic (congestive) heart failure: Secondary | ICD-10-CM | POA: Diagnosis not present

## 2015-01-16 DIAGNOSIS — R404 Transient alteration of awareness: Secondary | ICD-10-CM

## 2015-01-16 DIAGNOSIS — I10 Essential (primary) hypertension: Secondary | ICD-10-CM | POA: Diagnosis present

## 2015-01-16 DIAGNOSIS — E119 Type 2 diabetes mellitus without complications: Secondary | ICD-10-CM | POA: Diagnosis not present

## 2015-01-16 DIAGNOSIS — Z79899 Other long term (current) drug therapy: Secondary | ICD-10-CM | POA: Diagnosis not present

## 2015-01-16 DIAGNOSIS — J9601 Acute respiratory failure with hypoxia: Secondary | ICD-10-CM | POA: Diagnosis present

## 2015-01-16 DIAGNOSIS — G40909 Epilepsy, unspecified, not intractable, without status epilepticus: Secondary | ICD-10-CM

## 2015-01-16 DIAGNOSIS — R0902 Hypoxemia: Secondary | ICD-10-CM

## 2015-01-16 DIAGNOSIS — I69320 Aphasia following cerebral infarction: Secondary | ICD-10-CM | POA: Diagnosis not present

## 2015-01-16 DIAGNOSIS — E1165 Type 2 diabetes mellitus with hyperglycemia: Secondary | ICD-10-CM

## 2015-01-16 DIAGNOSIS — Z833 Family history of diabetes mellitus: Secondary | ICD-10-CM

## 2015-01-16 DIAGNOSIS — H538 Other visual disturbances: Secondary | ICD-10-CM | POA: Diagnosis present

## 2015-01-16 DIAGNOSIS — E162 Hypoglycemia, unspecified: Secondary | ICD-10-CM

## 2015-01-16 DIAGNOSIS — I679 Cerebrovascular disease, unspecified: Secondary | ICD-10-CM | POA: Diagnosis present

## 2015-01-16 DIAGNOSIS — R4701 Aphasia: Secondary | ICD-10-CM

## 2015-01-16 DIAGNOSIS — IMO0002 Reserved for concepts with insufficient information to code with codable children: Secondary | ICD-10-CM

## 2015-01-16 DIAGNOSIS — E11649 Type 2 diabetes mellitus with hypoglycemia without coma: Secondary | ICD-10-CM | POA: Diagnosis present

## 2015-01-16 DIAGNOSIS — I69920 Aphasia following unspecified cerebrovascular disease: Secondary | ICD-10-CM

## 2015-01-16 DIAGNOSIS — I5032 Chronic diastolic (congestive) heart failure: Secondary | ICD-10-CM | POA: Diagnosis present

## 2015-01-16 HISTORY — DX: Hyperlipidemia, unspecified: E78.5

## 2015-01-16 HISTORY — DX: Gastro-esophageal reflux disease without esophagitis: K21.9

## 2015-01-16 HISTORY — DX: Chronic diastolic (congestive) heart failure: I50.32

## 2015-01-16 HISTORY — DX: Bacteremia: R78.81

## 2015-01-16 HISTORY — DX: Cerebral infarction, unspecified: I63.9

## 2015-01-16 HISTORY — DX: Epilepsy, unspecified, not intractable, with status epilepticus: G40.901

## 2015-01-16 LAB — CBC WITH DIFFERENTIAL/PLATELET
BASOS PCT: 1 % (ref 0–1)
Basophils Absolute: 0 10*3/uL (ref 0.0–0.1)
EOS ABS: 0.2 10*3/uL (ref 0.0–0.7)
Eosinophils Relative: 2 % (ref 0–5)
HCT: 38.3 % (ref 36.0–46.0)
HEMOGLOBIN: 12 g/dL (ref 12.0–15.0)
Lymphocytes Relative: 20 % (ref 12–46)
Lymphs Abs: 1.8 10*3/uL (ref 0.7–4.0)
MCH: 29.7 pg (ref 26.0–34.0)
MCHC: 31.3 g/dL (ref 30.0–36.0)
MCV: 94.8 fL (ref 78.0–100.0)
MONO ABS: 1 10*3/uL (ref 0.1–1.0)
Monocytes Relative: 11 % (ref 3–12)
Neutro Abs: 5.7 10*3/uL (ref 1.7–7.7)
Neutrophils Relative %: 66 % (ref 43–77)
Platelets: 432 10*3/uL — ABNORMAL HIGH (ref 150–400)
RBC: 4.04 MIL/uL (ref 3.87–5.11)
RDW: 14.5 % (ref 11.5–15.5)
WBC: 8.7 10*3/uL (ref 4.0–10.5)

## 2015-01-16 LAB — BASIC METABOLIC PANEL
Anion gap: 8 (ref 5–15)
BUN: 14 mg/dL (ref 6–20)
CALCIUM: 8.8 mg/dL — AB (ref 8.9–10.3)
CO2: 29 mmol/L (ref 22–32)
CREATININE: 0.56 mg/dL (ref 0.44–1.00)
Chloride: 106 mmol/L (ref 101–111)
GFR calc Af Amer: >60 mL/min (ref >60)
GFR calc non Af Amer: >60 mL/min (ref >60)
Glucose, Bld: 82 mg/dL (ref 65–99)
Potassium: 3.9 mmol/L (ref 3.5–5.1)
Sodium: 143 mmol/L (ref 135–145)

## 2015-01-16 LAB — GLUCOSE, CAPILLARY
GLUCOSE-CAPILLARY: 152 mg/dL — AB (ref 65–99)
Glucose-Capillary: 110 mg/dL — ABNORMAL HIGH (ref 65–99)
Glucose-Capillary: 171 mg/dL — ABNORMAL HIGH (ref 65–99)

## 2015-01-16 LAB — CBG MONITORING, ED: GLUCOSE-CAPILLARY: 90 mg/dL (ref 65–99)

## 2015-01-16 LAB — I-STAT TROPONIN, ED: TROPONIN I, POC: 0 ng/mL (ref 0.00–0.08)

## 2015-01-16 LAB — BRAIN NATRIURETIC PEPTIDE: B Natriuretic Peptide: 144.3 pg/mL — ABNORMAL HIGH (ref 0.0–100.0)

## 2015-01-16 MED ORDER — CARVEDILOL 3.125 MG PO TABS
3.1250 mg | ORAL_TABLET | Freq: Two times a day (BID) | ORAL | Status: DC
  Administered 2015-01-16 – 2015-01-18 (×4): 3.125 mg via ORAL
  Filled 2015-01-16 (×4): qty 1

## 2015-01-16 MED ORDER — FUROSEMIDE 10 MG/ML IJ SOLN
20.0000 mg | Freq: Once | INTRAMUSCULAR | Status: AC
  Administered 2015-01-16: 20 mg via INTRAVENOUS
  Filled 2015-01-16: qty 4

## 2015-01-16 MED ORDER — SENNOSIDES-DOCUSATE SODIUM 8.6-50 MG PO TABS
1.0000 | ORAL_TABLET | Freq: Two times a day (BID) | ORAL | Status: DC
  Administered 2015-01-16 – 2015-01-18 (×4): 1 via ORAL
  Filled 2015-01-16 (×4): qty 1

## 2015-01-16 MED ORDER — LEVETIRACETAM 500 MG PO TABS
500.0000 mg | ORAL_TABLET | Freq: Two times a day (BID) | ORAL | Status: DC
  Administered 2015-01-16 – 2015-01-18 (×5): 500 mg via ORAL
  Filled 2015-01-16 (×5): qty 1

## 2015-01-16 MED ORDER — ASPIRIN EC 325 MG PO TBEC
325.0000 mg | DELAYED_RELEASE_TABLET | Freq: Every day | ORAL | Status: DC
  Administered 2015-01-16 – 2015-01-18 (×3): 325 mg via ORAL
  Filled 2015-01-16 (×3): qty 1

## 2015-01-16 MED ORDER — FUROSEMIDE 10 MG/ML IJ SOLN
40.0000 mg | Freq: Two times a day (BID) | INTRAMUSCULAR | Status: DC
  Administered 2015-01-16 – 2015-01-18 (×4): 40 mg via INTRAVENOUS
  Filled 2015-01-16 (×4): qty 4

## 2015-01-16 MED ORDER — ENOXAPARIN SODIUM 40 MG/0.4ML ~~LOC~~ SOLN
40.0000 mg | SUBCUTANEOUS | Status: DC
  Administered 2015-01-16 – 2015-01-17 (×2): 40 mg via SUBCUTANEOUS
  Filled 2015-01-16 (×2): qty 0.4

## 2015-01-16 MED ORDER — INSULIN ASPART 100 UNIT/ML ~~LOC~~ SOLN: 0.0000 [IU] | Freq: Every day | SUBCUTANEOUS | Status: DC

## 2015-01-16 MED ORDER — ATORVASTATIN CALCIUM 20 MG PO TABS
20.0000 mg | ORAL_TABLET | Freq: Every day | ORAL | Status: DC
  Administered 2015-01-16 – 2015-01-17 (×2): 20 mg via ORAL
  Filled 2015-01-16 (×3): qty 1

## 2015-01-16 MED ORDER — ISOSORBIDE DINITRATE 5 MG PO TABS
5.0000 mg | ORAL_TABLET | Freq: Three times a day (TID) | ORAL | Status: DC
  Administered 2015-01-16 – 2015-01-18 (×6): 5 mg via ORAL
  Filled 2015-01-16 (×7): qty 1

## 2015-01-16 MED ORDER — PANTOPRAZOLE SODIUM 40 MG PO TBEC
40.0000 mg | DELAYED_RELEASE_TABLET | Freq: Every day | ORAL | Status: DC
  Administered 2015-01-16 – 2015-01-18 (×3): 40 mg via ORAL
  Filled 2015-01-16 (×4): qty 1

## 2015-01-16 MED ORDER — INSULIN ASPART 100 UNIT/ML ~~LOC~~ SOLN
0.0000 [IU] | Freq: Three times a day (TID) | SUBCUTANEOUS | Status: DC
  Administered 2015-01-16 – 2015-01-18 (×4): 3 [IU] via SUBCUTANEOUS

## 2015-01-16 NOTE — ED Notes (Addendum)
Pt. Walked well to the bathroom throughout her walked her pulse oximetry was 88-90 percent. Nurse was made aware.

## 2015-01-16 NOTE — ED Notes (Signed)
Pt was found to be altered this AM with a CBG of 29, given 1 amp of D50 and it improved to 178 and pt returned to baseline. Alert and oriented and in NAD on arrival to ED, but reports nausea.

## 2015-01-16 NOTE — Evaluation (Signed)
Physical Therapy Evaluation Patient Details Name: April [name] MRN: [id number] DOB: [birth year] Today's Date: 01/16/2015   History of Present Illness  74 yo female admitted with hypoglycemia, hypoxia. Hx of poorly controlled DM, HTN, CVA, Sz, aphasia, residual visual field impairment R eye. Pt is from ALF  Clinical Impression  On eval, pt required Min guard assist for mobility-able to ambulate ~60 feet with RW. Impulsive at times-VCs for safety during session. Also unsteady at times. Recommend pt resume HHPT at ALF. O2 sats 91% on RA at end of session.     Follow Up Recommendations Home health PT (at ALF)    Equipment Recommendations  None recommended by PT    Recommendations for Other Services       Precautions / Restrictions Precautions Precautions: Fall Restrictions Weight Bearing Restrictions: No      Mobility  Bed Mobility Overal bed mobility: Needs Assistance Bed Mobility: Supine to Sit     Supine to sit: Supervision     General bed mobility comments: for safety  Transfers Overall transfer level: Needs assistance Equipment used: Rolling walker (2 wheeled) Transfers: Sit to/from Stand Sit to Stand: Min guard         General transfer comment: close guard for safety  Ambulation/Gait Ambulation/Gait assistance: Min guard Ambulation Distance (Feet): 60 Feet Assistive device: Rolling walker (2 wheeled) Gait Pattern/deviations: Step-through pattern;Ataxic;Decreased stride length     General Gait Details: slow, mildly ataxic gait pattern. Unsteady at times. O2 sats 91% on RA once back in room.   Stairs            Wheelchair Mobility    Modified Rankin (Stroke Patients Only)       Balance Overall balance assessment: Needs assistance;History of Falls         Standing balance support: Bilateral upper extremity supported;During functional activity Standing balance-Leahy Scale: Poor Standing balance comment: needs RW                              Pertinent Vitals/Pain Pain Assessment: No/denies pain    Home Living Family/patient expects to be discharged to:: Assisted living               Home Equipment: Walker - 2 wheels;Wheelchair - manual      Prior Function Level of Independence: Independent with assistive device(s)         Comments: uses walker in room. wheelchair for longer distances     Hand Dominance        Extremity/Trunk Assessment   Upper Extremity Assessment: Generalized weakness           Lower Extremity Assessment: Generalized weakness      Cervical / Trunk Assessment: Normal  Communication   Communication: Expressive difficulties  Cognition Arousal/Alertness: Awake/alert Behavior During Therapy: WFL for tasks assessed/performed Overall Cognitive Status: History of cognitive impairments - at baseline                      General Comments      Exercises        Assessment/Plan    PT Assessment Patient needs continued PT services  PT Diagnosis Difficulty walking;Abnormality of gait;Generalized weakness   PT Problem List Decreased strength;Decreased activity tolerance;Decreased balance;Decreased mobility;Decreased knowledge of use of DME;Decreased cognition  PT Treatment Interventions DME instruction;Gait training;Functional mobility training;Therapeutic activities;Therapeutic exercise;Patient/family education;Balance training   PT Goals (Current goals can be found in the Care Plan section)  Acute Rehab PT Goals Patient Stated Goal: none stated PT Goal Formulation: With patient Time For Goal Achievement: 01/30/15 Potential to Achieve Goals: Good    Frequency Min 3X/week   Barriers to discharge        Co-evaluation               End of Session Equipment Utilized During Treatment: Gait belt Activity Tolerance: Patient tolerated treatment well Patient left: in chair;with call bell/phone within reach;with bed alarm set;with chair alarm  set;with family/visitor present           Time: 8527-7824 PT Time Calculation (min) (ACUTE ONLY): 17 min   Charges:   PT Evaluation $Initial PT Evaluation Tier I: 1 Procedure     PT G Codes:        Weston Anna, MPT Pager: [phone]

## 2015-01-16 NOTE — ED Notes (Signed)
Bed: ZX28 Expected date:  Expected time:  Means of arrival:  Comments: EMS- 74yo F, hypoglycemia

## 2015-01-16 NOTE — Care Management Note (Signed)
Case Management Note  Patient Details  Name: April [name] MRN: [id number] Date of Birth: [birth year]  Subjective/Objective:  74 y/o f admitted w/hypoxia.From ALF. PT cons placed.Await recommendations.                  Action/Plan:Monitor progress for d/c needs.   Expected Discharge Date:   (UNKNOWN)               Expected Discharge Plan:  Assisted Living / Rest Home  In-House Referral:  Clinical Social Work  Discharge planning Services  CM Consult  Post Acute Care Choice:    Choice offered to:     DME Arranged:    DME Agency:     HH Arranged:    HH Agency:     Status of Service:  In process, will continue to follow  Medicare Important Message Given:    Date Medicare IM Given:    Medicare IM give by:    Date Additional Medicare IM Given:    Additional Medicare Important Message give by:     If discussed at [place] of Stay Meetings, dates discussed:    Additional Comments:  Dessa Phi, RN 01/16/2015, 3:50 PM

## 2015-01-16 NOTE — H&P (Signed)
History and Physical  April [name] HGD:[id number] DOB: 1940-09-19 DOA: 01/16/2015  Referring physician: EDP PCP: Nance Pear., NP   Chief Complaint:   HPI: April [name] is a 74 y.o. female  with history of uncontrolled insulin dependent diabetes, high blood pressure, depression, stroke(10/2014), seizures presents after transient altered mental status this morning. Patient was found to have glucose 29, patient improved to baseline with D50. Patient has had mild nausea and generally not feeling well today. No fevers or chills recently. Patient denies any other new concern except nonproductive cough and bilateral lower extremity edema since last admission and getting worse, she denies chest pain, no DOE, no PND, no palpitation, no dizziness, no syncope. Family also reported fluctuating blood sugar with frequent hypoglycemia episode at ALF.   ED course, patient mental status returned to baseline shortly after arrival to ED, however, she was found to be hypoxic on ambulation with oxygen saturation og 88% on room air, cxr concerning for CHF, she was given lasix 20mg  IV, hospitalist called to admit the patient.   Review of Systems:  Detail per HPI, Review of systems are otherwise negative  Past Medical History  Diagnosis Date  . Diabetes mellitus   . Hypertension   . Retinopathy 06/06/2013    Per eye exam 05/31/13 eye exam, Moderate retinopathy   . Aphasia   . Seizures   . Rectum bleeding   . Stroke    Past Surgical History  Procedure Laterality Date  . Appendectomy  1972  . Abdominal hysterectomy  1972  . Tee without cardioversion N/A 10/24/2014    Procedure: TRANSESOPHAGEAL ECHOCARDIOGRAM (TEE);  Surgeon: Dorothy Spark, MD;  Location: [facility];  Service: Cardiovascular;  Laterality: N/A;   Social History:  reports that she has never smoked. She has never used smokeless tobacco. She reports that she does not drink alcohol or use illicit drugs. Patient lives at  [facility] & is able to participate in activities of daily living with a walker, reported getting ongoing PT/OT/speech therapy  Allergies  Allergen Reactions  . Codeine Nausea And Vomiting  . Sulfa Antibiotics Nausea And Vomiting    Family History  Problem Relation Age of Onset  . Arthritis Maternal Grandmother   . Heart disease Maternal Grandmother 80  . Diabetes Mother   . Stomach cancer Mother       Prior to Admission medications   Medication Sig Start Date End Date Taking? Authorizing Provider  amLODipine (NORVASC) 10 MG tablet Take 1 tablet (10 mg total) by mouth daily. 10/25/14  Yes Barton Dubois, MD  aspirin EC 325 MG tablet Take 1 tablet (325 mg total) by mouth daily. 10/25/14  Yes Barton Dubois, MD  atorvastatin (LIPITOR) 20 MG tablet Take 1 tablet (20 mg total) by mouth daily at 6 PM. 10/25/14  Yes Barton Dubois, MD  carvedilol (COREG) 25 MG tablet Take 1 tablet (25 mg total) by mouth 2 (two) times daily with a meal. 10/25/14  Yes Barton Dubois, MD  glimepiride (AMARYL) 2 MG tablet Take 1 tablet (2 mg total) by mouth daily with breakfast. 10/25/14  Yes Barton Dubois, MD  hydrALAZINE (APRESOLINE) 25 MG tablet Take 37.5 mg by mouth 3 (three) times daily.   Yes Historical Provider, MD  insulin detemir (LEVEMIR) 100 UNIT/ML injection Inject 0.25 mLs (25 Units total) into the skin daily. Patient taking differently: Inject 25 Units into the skin at bedtime.  10/25/14  Yes Barton Dubois, MD  isosorbide dinitrate (ISORDIL) 20 MG tablet Take 20  mg by mouth 3 (three) times daily. Take 1 tablet by mouth three times a day   Yes Historical Provider, MD  levETIRAcetam (KEPPRA) 500 MG tablet Take 1 tablet (500 mg total) by mouth 2 (two) times daily. 10/25/14  Yes Barton Dubois, MD  ondansetron (ZOFRAN ODT) 8 MG disintegrating tablet Take 1 tablet (8 mg total) by mouth every 8 (eight) hours as needed for nausea or vomiting. 10/25/14  Yes Barton Dubois, MD  pantoprazole (PROTONIX) 40 MG tablet Take 1 tablet (40 mg  total) by mouth daily. 10/25/14  Yes Barton Dubois, MD  PROTEIN PO Take 2 scoop by mouth 2 (two) times daily. *Procell Protein Supplement*   Yes Historical Provider, MD  isosorbide-hydrALAZINE (BIDIL) 20-37.5 MG per tablet Take 1 tablet by mouth 3 (three) times daily. Patient not taking: Reported on 01/16/2015 10/25/14   Barton Dubois, MD  potassium chloride SA (K-DUR,KLOR-CON) 20 MEQ tablet Take 2 tablets (40 mEq total) by mouth daily. Patient not taking: Reported on 01/16/2015 10/25/14   Barton Dubois, MD    Physical Exam: BP 132/62 mmHg  Pulse 74  Temp(Src) 96.5 F (35.8 C) (Rectal)  Resp 16  SpO2 93%  LMP 08/18/1970  General:  AAOx4,NAD Eyes: PERRL ENT: unremarkable Neck: supple, no JVD Cardiovascular: RRR Respiratory: CTABL Abdomen: soft/ND/ND, positive bowel sounds Skin: no rash Musculoskeletal:  Bilateral lower extremity pitting edema, L.R, (reproted past right leg injury with multiple surgery) Psychiatric: calm/cooperative Neurologic: chronic findings from prior CVA in 10/2014, with expressive aphasia, no focal weakness.          Labs on Admission:  Basic Metabolic Panel:  Recent Labs Lab 01/16/15 0842  NA 143  K 3.9  CL 106  CO2 29  GLUCOSE 82  BUN 14  CREATININE 0.56  CALCIUM 8.8*   Liver Function Tests: No results for input(s): AST, ALT, ALKPHOS, BILITOT, PROT, ALBUMIN in the last 168 hours. No results for input(s): LIPASE, AMYLASE in the last 168 hours. No results for input(s): AMMONIA in the last 168 hours. CBC:  Recent Labs Lab 01/16/15 0842  WBC 8.7  NEUTROABS 5.7  HGB 12.0  HCT 38.3  MCV 94.8  PLT 432*   Cardiac Enzymes: No results for input(s): CKTOTAL, CKMB, CKMBINDEX, TROPONINI in the last 168 hours.  BNP (last 3 results)  Recent Labs  01/16/15 1119  BNP 144.3*    ProBNP (last 3 results) No results for input(s): PROBNP in the last 8760 hours.  CBG:  Recent Labs Lab 01/16/15 0813  GLUCAP 90    Radiological Exams on  Admission: Dg Chest 2 View  01/16/2015   CLINICAL DATA:  Hypoglycemia, altered mental status  EXAM: CHEST  2 VIEW  COMPARISON:  PA and lateral chest of October 22, 2014  FINDINGS: The cardiac silhouette is enlarged. The pulmonary vascularity is engorged. The pulmonary interstitial markings are increased. There is are small pleural effusions layering posteriorly. There is no alveolar infiltrate. The bony structures exhibit no acute abnormalities.  IMPRESSION: CHF with pulmonary interstitial edema and small pleural effusions. There is no alveolar pneumonia.   Electronically Signed   By: David  Martinique M.D.   On: 01/16/2015 09:08    EKG: Independently reviewed. Sinus rhythm, QTc 492,no acute St/t changes.  Assessment/Plan Present on Admission:  . Hypoxia  Hypoxia, 88% on room air, no increased wob, does reported nonproductive cough,no fever, no leukocytosis, no sore throat, cxr consistent with interstitial edema, mild pleural effusion,lung exam good aeration, no obvious rales, no wheezing, does has  pitting edema lower extremity. Recent echo in 09/2447 showed diastolic dysfunction with preserved LVEF. Will treat as acute on chronic diastolic CHF with lasix 40mg  iv bid, daily weight,low salt diet.continue oxygen supplement, wean as tolerated.  Lower extremity edema: likely from CHF, will check venous US to r/o DVT.  HTN: will hold norvasc due to lower extremity edema, will decrease coreg/isordil dose,hold hydralazine, to leave room for diuresis. bp meds need to be adjusted prior to discharge.  Hypoglycemia : with h/o uncontrolled insulin dependent DM2, family reported fluctuating blood sugar with frequent hypoglycemia episodes. a1c has been elevated, in 10/2014, will repeat a1c. On sliding scale for now. Hold long acting insulin. D/c amaryl due to reported frequent hypoglycemia episodes.  Unresponsiveness, resolved after correcting hypoglycemia, no new focal weakness, no confusion, no seizure observed. Now  back to baseline.  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 Continue dysphagia III diet.  H/o seizure, continue keppra.   DVT prophylaxis: lovenox  Consultants: none  Code Status: full   Family Communication:  Patient  And neice  Disposition Plan: admit to  Med tele  Time spent: >38mins  Gearlene Godsil MD, PhD [facility] Pager [phone] If 7PM-7AM, please contact night-coverage at [email or web address], password [facility]

## 2015-01-16 NOTE — Progress Notes (Signed)
*  PRELIMINARY RESULTS* Vascular Ultrasound Lower extremity venous duplex has been completed.  Preliminary findings: negative for DVT.   Landry Mellow, RDMS, RVT  01/16/2015, 3:00 PM

## 2015-01-16 NOTE — ED Provider Notes (Signed)
CSN: [id number]     Arrival date & time 01/16/15  0808 History   First MD Initiated Contact with Patient 01/16/15 [phone]     Chief Complaint  Patient presents with  . Hypoglycemia     (Consider location/radiation/quality/duration/timing/severity/associated sxs/prior Treatment) HPI Comments: 74 year old female with history of uncontrolled diabetes on oral medicines and insulin, high blood pressure, depression, stroke, bacteremia, seizures presents after transient altered mental status this morning. Patient was found to have glucose 29, patient improved to baseline with D50. Patient has had mild nausea and generally not feeling well today. No fevers or chills recently. Patient denies any other new concern except nonproductive cough. No changes or missed doses and medications. I'll difficulty with history as patient very with details of her medical history and very quiet speech.  Patient is a 74 y.o. female presenting with hypoglycemia. The history is provided by the patient, medical records and the EMS personnel.  Hypoglycemia Associated symptoms: no shortness of breath and no vomiting     Past Medical History  Diagnosis Date  . Diabetes mellitus   . Hypertension   . Retinopathy 06/06/2013    Per eye exam 05/31/13 eye exam, Moderate retinopathy   . Aphasia   . Seizures   . Rectum bleeding   . Stroke    Past Surgical History  Procedure Laterality Date  . Appendectomy  1972  . Abdominal hysterectomy  1972  . Tee without cardioversion N/A 10/24/2014    Procedure: TRANSESOPHAGEAL ECHOCARDIOGRAM (TEE);  Surgeon: Dorothy Spark, MD;  Location: [facility] ENDOSCOPY;  Service: Cardiovascular;  Laterality: N/A;   Family History  Problem Relation Age of Onset  . Arthritis Maternal Grandmother   . Heart disease Maternal Grandmother 80  . Diabetes Mother   . Stomach cancer Mother    History  Substance Use Topics  . Smoking status: Never Smoker   . Smokeless tobacco: Never Used  . Alcohol Use:  No   OB History    No data available     Review of Systems  Constitutional: Negative for fever and chills.  HENT: Negative for congestion.   Eyes: Negative for visual disturbance.  Respiratory: Positive for cough. Negative for shortness of breath.   Cardiovascular: Negative for chest pain.  Gastrointestinal: Negative for vomiting and abdominal pain.  Genitourinary: Negative for dysuria and flank pain.  Musculoskeletal: Negative for back pain, neck pain and neck stiffness.  Skin: Negative for rash.  Neurological: Negative for light-headedness and headaches.  Psychiatric/Behavioral: Positive for confusion.      Allergies  Codeine and Sulfa antibiotics  Home Medications   Prior to Admission medications   Medication Sig Start Date End Date Taking? Authorizing Provider  amLODipine (NORVASC) 10 MG tablet Take 1 tablet (10 mg total) by mouth daily. 10/25/14  Yes Barton Dubois, MD  aspirin EC 325 MG tablet Take 1 tablet (325 mg total) by mouth daily. 10/25/14  Yes Barton Dubois, MD  atorvastatin (LIPITOR) 20 MG tablet Take 1 tablet (20 mg total) by mouth daily at 6 PM. 10/25/14  Yes Barton Dubois, MD  carvedilol (COREG) 25 MG tablet Take 1 tablet (25 mg total) by mouth 2 (two) times daily with a meal. 10/25/14  Yes Barton Dubois, MD  glimepiride (AMARYL) 2 MG tablet Take 1 tablet (2 mg total) by mouth daily with breakfast. 10/25/14  Yes Barton Dubois, MD  hydrALAZINE (APRESOLINE) 25 MG tablet Take 37.5 mg by mouth 3 (three) times daily.   Yes Historical Provider, MD  insulin detemir (  LEVEMIR) 100 UNIT/ML injection Inject 0.25 mLs (25 Units total) into the skin daily. Patient taking differently: Inject 25 Units into the skin at bedtime.  10/25/14  Yes Barton Dubois, MD  isosorbide dinitrate (ISORDIL) 20 MG tablet Take 20 mg by mouth 3 (three) times daily. Take 1 tablet by mouth three times a day   Yes Historical Provider, MD  levETIRAcetam (KEPPRA) 500 MG tablet Take 1 tablet (500 mg total) by mouth  2 (two) times daily. 10/25/14  Yes Barton Dubois, MD  ondansetron (ZOFRAN ODT) 8 MG disintegrating tablet Take 1 tablet (8 mg total) by mouth every 8 (eight) hours as needed for nausea or vomiting. 10/25/14  Yes Barton Dubois, MD  pantoprazole (PROTONIX) 40 MG tablet Take 1 tablet (40 mg total) by mouth daily. 10/25/14  Yes Barton Dubois, MD  PROTEIN PO Take 2 scoop by mouth 2 (two) times daily. *Procell Protein Supplement*   Yes Historical Provider, MD  isosorbide-hydrALAZINE (BIDIL) 20-37.5 MG per tablet Take 1 tablet by mouth 3 (three) times daily. Patient not taking: Reported on 01/16/2015 10/25/14   Barton Dubois, MD  potassium chloride SA (K-DUR,KLOR-CON) 20 MEQ tablet Take 2 tablets (40 mEq total) by mouth daily. Patient not taking: Reported on 01/16/2015 10/25/14   Barton Dubois, MD   BP 138/86 mmHg  Pulse 65  Temp(Src) 96.5 F (35.8 C) (Rectal)  Resp 18  SpO2 88%  LMP 08/18/1970 Physical Exam  Constitutional: She is oriented to person, place, and time. She appears well-developed and well-nourished.  HENT:  Head: Normocephalic and atraumatic.  Eyes: Conjunctivae are normal. Right eye exhibits no discharge. Left eye exhibits no discharge.  Neck: Normal range of motion. Neck supple. No tracheal deviation present.  Cardiovascular: Regular rhythm.  Bradycardia present.   Pulmonary/Chest: Effort normal and breath sounds normal.  Abdominal: Soft. She exhibits no distension. There is no tenderness. There is no guarding.  Musculoskeletal: She exhibits no edema.  Neurological: She is alert and oriented to person, place, and time. No cranial nerve deficit.  Pupils equal bilateral, patient has 5+ strength upper lower extremities no arm drift no leg drift. Finger-nose intact. Quite soft speech.  Skin: Skin is warm. No rash noted.  Psychiatric: She has a normal mood and affect.  Nursing note and vitals reviewed.   ED Course  Procedures (including critical care time) Labs Review Labs Reviewed   BASIC METABOLIC PANEL - Abnormal; Notable for the following:    Calcium 8.8 (*)    All other components within normal limits  CBC WITH DIFFERENTIAL/PLATELET - Abnormal; Notable for the following:    Platelets 432 (*)    All other components within normal limits  BRAIN NATRIURETIC PEPTIDE - Abnormal; Notable for the following:    B Natriuretic Peptide 144.3 (*)    All other components within normal limits  CBG MONITORING, ED  Randolm Idol, ED    Imaging Review Dg Chest 2 View  01/16/2015   CLINICAL DATA:  Hypoglycemia, altered mental status  EXAM: CHEST  2 VIEW  COMPARISON:  PA and lateral chest of October 22, 2014  FINDINGS: The cardiac silhouette is enlarged. The pulmonary vascularity is engorged. The pulmonary interstitial markings are increased. There is are small pleural effusions layering posteriorly. There is no alveolar infiltrate. The bony structures exhibit no acute abnormalities.  IMPRESSION: CHF with pulmonary interstitial edema and small pleural effusions. There is no alveolar pneumonia.   Electronically Signed   By: David  Martinique M.D.   On: 01/16/2015 09:08  EKG Interpretation None     EKG reviewed heart rate 70, sinus, prolonged QT, no acute ST elevation, poor baseline MDM   Final diagnoses:  Hypoglycemia  Diabetes mellitus type II, uncontrolled  Dyspnea  Hypoxia   Patient with uncontrolled diabetes history presents after episode of confusion and low glucose, patient reportedly at baseline. Patient has mild nausea and cough. Plan for blood work, chest x-ray, oral fluids and observation ER.  Patient's screening labs no acute findings, glucose normalized, patient tolerated oral fluids and had mild improvement in ER. Concern is that patient requiring oxygen which is new for the patient. No heart failure history last echo reviewed normal ejection fraction. On recheck patient overall well-appearing. Walking pulse ox 80%. Discussed admission with Dr. Erlinda Hong  The patients  results and plan were reviewed and discussed.   Any x-rays performed were personally reviewed by myself.   Differential diagnosis were considered with the presenting HPI.  Medications  furosemide (LASIX) injection 20 mg (20 mg Intravenous Given 01/16/15 1131)    Filed Vitals:   01/16/15 0915 01/16/15 1000 01/16/15 1108 01/16/15 1117  BP:  138/86    Pulse: 64 65    Temp:    96.5 F (35.8 C)  TempSrc:    Rectal  Resp: 19 18    SpO2: 88% 93% 88%     Final diagnoses:  Hypoglycemia  Diabetes mellitus type II, uncontrolled  Dyspnea  Hypoxia    Admission/ observation were discussed with the admitting physician, patient and/or family and they are comfortable with the plan.     Elnora Morrison, MD 01/16/15 [phone]

## 2015-01-17 ENCOUNTER — Encounter (HOSPITAL_COMMUNITY): Payer: Self-pay | Admitting: Internal Medicine

## 2015-01-17 DIAGNOSIS — J9601 Acute respiratory failure with hypoxia: Secondary | ICD-10-CM | POA: Diagnosis present

## 2015-01-17 DIAGNOSIS — IMO0002 Reserved for concepts with insufficient information to code with codable children: Secondary | ICD-10-CM

## 2015-01-17 DIAGNOSIS — R0902 Hypoxemia: Secondary | ICD-10-CM

## 2015-01-17 DIAGNOSIS — I679 Cerebrovascular disease, unspecified: Secondary | ICD-10-CM | POA: Diagnosis present

## 2015-01-17 DIAGNOSIS — G40909 Epilepsy, unspecified, not intractable, without status epilepticus: Secondary | ICD-10-CM

## 2015-01-17 DIAGNOSIS — I10 Essential (primary) hypertension: Secondary | ICD-10-CM

## 2015-01-17 DIAGNOSIS — Z794 Long term (current) use of insulin: Secondary | ICD-10-CM

## 2015-01-17 DIAGNOSIS — E785 Hyperlipidemia, unspecified: Secondary | ICD-10-CM | POA: Diagnosis present

## 2015-01-17 DIAGNOSIS — E1165 Type 2 diabetes mellitus with hyperglycemia: Secondary | ICD-10-CM

## 2015-01-17 DIAGNOSIS — I5033 Acute on chronic diastolic (congestive) heart failure: Secondary | ICD-10-CM | POA: Diagnosis present

## 2015-01-17 LAB — CBC
HCT: 37.1 % (ref 36.0–46.0)
Hemoglobin: 11.9 g/dL — ABNORMAL LOW (ref 12.0–15.0)
MCH: 30.4 pg (ref 26.0–34.0)
MCHC: 32.1 g/dL (ref 30.0–36.0)
MCV: 94.6 fL (ref 78.0–100.0)
Platelets: 417 10*3/uL — ABNORMAL HIGH (ref 150–400)
RBC: 3.92 MIL/uL (ref 3.87–5.11)
RDW: 14.5 % (ref 11.5–15.5)
WBC: 8.9 10*3/uL (ref 4.0–10.5)

## 2015-01-17 LAB — COMPREHENSIVE METABOLIC PANEL
ALT: 16 U/L (ref 14–54)
AST: 17 U/L (ref 15–41)
Albumin: 3.4 g/dL — ABNORMAL LOW (ref 3.5–5.0)
Alkaline Phosphatase: 97 U/L (ref 38–126)
Anion gap: 13 (ref 5–15)
BUN: 17 mg/dL (ref 6–20)
CO2: 29 mmol/L (ref 22–32)
Calcium: 9.1 mg/dL (ref 8.9–10.3)
Chloride: 101 mmol/L (ref 101–111)
Creatinine, Ser: 0.77 mg/dL (ref 0.44–1.00)
GFR calc Af Amer: >60 mL/min (ref >60)
GFR calc non Af Amer: >60 mL/min (ref >60)
Glucose, Bld: 173 mg/dL — ABNORMAL HIGH (ref 65–99)
Potassium: 3.7 mmol/L (ref 3.5–5.1)
Sodium: 143 mmol/L (ref 135–145)
Total Bilirubin: 0.5 mg/dL (ref 0.3–1.2)
Total Protein: 7.3 g/dL (ref 6.5–8.1)

## 2015-01-17 LAB — GLUCOSE, CAPILLARY
GLUCOSE-CAPILLARY: 172 mg/dL — AB (ref 65–99)
GLUCOSE-CAPILLARY: 177 mg/dL — AB (ref 65–99)
Glucose-Capillary: 153 mg/dL — ABNORMAL HIGH (ref 65–99)
Glucose-Capillary: 184 mg/dL — ABNORMAL HIGH (ref 65–99)

## 2015-01-17 LAB — PROTIME-INR
INR: 1.19 (ref 0.00–1.49)
Prothrombin Time: 15.2 seconds (ref 11.6–15.2)

## 2015-01-17 MED ORDER — POTASSIUM CHLORIDE CRYS ER 20 MEQ PO TBCR
20.0000 meq | EXTENDED_RELEASE_TABLET | Freq: Two times a day (BID) | ORAL | Status: DC
  Administered 2015-01-17 – 2015-01-18 (×3): 20 meq via ORAL
  Filled 2015-01-17 (×3): qty 1

## 2015-01-17 NOTE — Care Management Note (Signed)
Case Management Note  Patient Details  Name: April [name] MRN: [id number] Date of Birth: [birth year]  Subjective/Objective:                    Action/Plan:d/c plan return back to [name]-HHPT.   Expected Discharge Date:   (UNKNOWN)               Expected Discharge Plan:  Assisted Living / Rest Home  In-House Referral:  Clinical Social Work  Discharge planning Services  CM Consult  Post Acute Care Choice:    Choice offered to:     DME Arranged:    DME Agency:     HH Arranged:  PT ([name]-has their own PT-add to [facility].CSW notified.) [place] [facility] Agency:  Other - See comment ([facility] tel#[phone])  Status of Service:  In process, will continue to follow  Medicare Important Message Given:    Date Medicare IM Given:    Medicare IM give by:    Date Additional Medicare IM Given:    Additional Medicare Important Message give by:     If discussed at [place] of Stay Meetings, dates discussed:    Additional Comments:  Dessa Phi, RN 01/17/2015, 10:48 AM

## 2015-01-17 NOTE — Clinical Social Work Note (Signed)
Clinical Social Work Assessment  Patient Details  Name: April [name] MRN: [id number] Date of Birth: [birth year]  Date of referral:  01/17/15               Reason for consult:  Facility Placement                Permission sought to share information with:  Facility Art therapist granted to share information::  Yes, Verbal Permission Granted  Name::        Agency::     Relationship::     Contact Information:     Housing/Transportation Living arrangements for the past 2 months:  [place], [place] of Information:  Patient Patient Interpreter Needed:  None Criminal Activity/Legal Involvement Pertinent to Current Situation/Hospitalization:  No - Comment as needed Significant Relationships:  Other Family Members (patient's 3 nieces) Lives with:  Facility Resident Do you feel safe going back to the place where you live?  Yes Need for family participation in patient care:  Yes (Comment)  Care giving concerns:  CSW received consult that patient was admitted from [facility] (ph#: [phone]).    Social Worker assessment / plan:  CSW confirmed with patient that she plans to return to ALF at discharge, [facility] also confirmed with Shawna at [facility] that they would be able to take patient back at discharge.   Employment status:  Retired Nurse, adult PT Recommendations:  Home with [place] / Referral to community resources:     Patient/Family's Response to care:  Patient states that she was living at home alone and then was hospitalized with a stroke for 5 days and went to [facility] for rehab, then after rehab she moved into [facility] and has been there the past month. Patient states that she's been very pleased with [facility] - she has a roommate who is hardly in the room but she keeps busy with activities.   Patient/Family's Understanding of and Emotional  Response to Diagnosis, Current Treatment, and Prognosis:  Patient informed CSW that she was admitted to the hospital with a blood sugar of 29 and states that she felt like she was in a dream. Patient reports feeling a lot better today and glad she's no longer needing oxygen.   Emotional Assessment Appearance:  Appears stated age Attitude/Demeanor/Rapport:    Affect (typically observed):  Pleasant, Happy Orientation:  Oriented to Self, Oriented to Place, Oriented to  Time, Oriented to Situation Alcohol / Substance use:    Psych involvement (Current and /or in the community):  No (Comment)  Discharge Needs  Concerns to be addressed:    Readmission within the last 30 days:    Current discharge risk:    Barriers to Discharge:      Standley Brooking, LCSW 01/17/2015, 10:47 AM

## 2015-01-17 NOTE — Progress Notes (Signed)
Physical Therapy Treatment Patient Details Name: April [name] MRN: [id number] DOB: February 12, 1941 Today's Date: 01/17/2015    History of Present Illness 74 yo female admitted with hypoglycemia, hypoxia. Hx of poorly controlled DM, HTN, CVA, Sz, aphasia, residual visual field impairment R eye. Pt is from ALF    PT Comments    Progressing with mobility. Continues to be unsteady.   Follow Up Recommendations  Home health PT (at ALF)     Equipment Recommendations  None recommended by PT    Recommendations for Other Services       Precautions / Restrictions Precautions Precautions: Fall Restrictions Weight Bearing Restrictions: No    Mobility  Bed Mobility Overal bed mobility: Needs Assistance Bed Mobility: Supine to Sit     Supine to sit: Supervision     General bed mobility comments: for safety  Transfers Overall transfer level: Needs assistance Equipment used: Rolling walker (2 wheeled) Transfers: Sit to/from [facility] Sit to Stand: Min guard Stand pivot transfers: Min guard       General transfer comment: close guard for safety  Ambulation/Gait Ambulation/Gait assistance: Min assist Ambulation Distance (Feet): 75 Feet Assistive device: Rolling walker (2 wheeled) Gait Pattern/deviations: Step-through pattern;Decreased stride length;Ataxic     General Gait Details: slow, mildly ataxic gait pattern. Unsteady at times-assist to stabilize intermittently.    Stairs            Wheelchair Mobility    Modified Rankin (Stroke Patients Only)       Balance                                    Cognition Arousal/Alertness: Awake/alert Behavior During Therapy: WFL for tasks assessed/performed Overall Cognitive Status: History of cognitive impairments - at baseline                      Exercises      General Comments        Pertinent Vitals/Pain Pain Assessment: No/denies pain    Home Living                       Prior Function            PT Goals (current goals can now be found in the care plan section) Progress towards PT goals: Progressing toward goals    Frequency  Min 3X/week    PT Plan Current plan remains appropriate    Co-evaluation             End of Session Equipment Utilized During Treatment: Gait belt Activity Tolerance: Patient tolerated treatment well Patient left: in chair;with call bell/phone within reach;with nursing/sitter in room     Time: 0952-1005 PT Time Calculation (min) (ACUTE ONLY): 13 min  Charges:  $Gait Training: 8-22 mins                    G Codes:      April [name], MPT Pager: [phone]

## 2015-01-17 NOTE — Progress Notes (Signed)
Inpatient Diabetes Program Recommendations  AACE/ADA: New Consensus Statement on Inpatient Glycemic Control (2013)  Target Ranges:  Prepandial:   less than 140 mg/dL      Peak postprandial:   less than 180 mg/dL (1-2 hours)      Critically ill patients:  140 - 180 mg/dL   Reason for Visit: Diabetes Consult  Diabetes history: DM2 Outpatient Diabetes medications: Levemir 25 units QHS, Amaryl 2 mg QAM Current orders for Inpatient glycemic control: Novolog moderate tidwc and hs  75 year old female with history of uncontrolled diabetes on oral medicines and insulin, high blood pressure, depression, stroke, bacteremia, seizures presents after transient altered mental status this morning. Patient was found to have glucose 29. Pt states her blood sugars and very high or low. States occasionally she eats lunch, then has a cookie, "and then they check my blood sugar."  Lives in ALF and pt has blood sugars checked in am and before dinner meal. In looking at blood sugar log from ALF, 6 out of 26 am CBGs were < 70. Almost 50% of CBGs taken before dinner were  > 200. Results for TNIYA, BOWDITCH (MRN [id number]) as of 01/17/2015 16:23  Ref. Range 10/22/2014 05:49  Hemoglobin A1C Latest Ref Range: 4.8-5.6 % 10.1 (H)  Results for LOREDA, SILVERIO (MRN [id number]) as of 01/17/2015 16:23  Ref. Range 01/16/2015 14:16 01/16/2015 16:39 01/16/2015 20:54 01/17/2015 07:26 01/17/2015 11:57  Glucose-Capillary Latest Ref Range: 65-99 mg/dL 110 (H) 152 (H) 171 (H) 153 (H) 172 (H)     Blood sugars thus far in hospital are WNL. May consider decreasing Levemir to 20 units QHS. Would likely benefit from small amount of meal coverage insulin at ALF.  Will continue to follow. Thank you. Lorenda Peck, RD, LDN, CDE Inpatient Diabetes Coordinator [phone]

## 2015-01-17 NOTE — Progress Notes (Signed)
Progress Note   April [name] MEQ:[id number] DOB: Feb 17, 1941 DOA: 01/16/2015 PCP: Nance Pear., NP   Brief Narrative:   April [name] is an 74 y.o. female with a PMH of uncontrolled insulin-dependent diabetes, hypertension, depression, CVA, and seizure disorder who was admitted 01/16/15 with transient alteration of mental status and hypoglycemia. While in the ED, the patient was noted to be hypoxic with an O2 saturation of 88% on room air with ambulation. Chest x-ray was concerning for CHF.  Assessment/Plan:   Principal Problem:   Acute respiratory failure with hypoxia secondary to acute on chronic diastolic CHF - Chest x-ray consistent with CHF (pulmonary interstitial edema and small pleural effusions). - Last echo done 10/23/14. EF 55-60 percent with grade 1 diastolic dysfunction. - Continue to diurese: Lasix 40 mg IV twice a day. - Continue to monitor I/O and daily weights. - Weaned off oxygen, dyspnea improved.  Active Problems:   Hypoglycemia - Was on Amaryl prior to admission. - Continue to hold.  CBGs 90-171. - Hemoglobin A1c 10.1 on 10/22/14.  Will consult DM coordinator.    HYPERTENSION, BENIGN SYSTEMIC - Antihypertensives adjusted. Norvasc and hydralazine on hold. Coreg/Isordil dosages reduced. - Blood pressure currently controlled with the addition of Lasix.    Uncontrolled type 2 diabetes mellitus with insulin therapy - Currently being managed with moderate scale SSI. CBGs 90-171.    Hyperlipidemia - Continue statin.    Cerebrovascular disease - Continue aspirin. - Continue risk factor modification with blood pressure, glycemic, and lipid control.     Seizure disorder - Continue Keppra.    DVT Prophylaxis - Continue Lovenox.  Code Status: Full. Family Communication: No family at bedside.  3 nieces live locally.  Declines my offer to call. Disposition Plan: From [facility], possibly stable for D/C in a.m if blood glucoses stable.   IV  Access:    Peripheral IV   Procedures and diagnostic studies:   Dg Chest 2 View  01/16/2015   CLINICAL DATA:  Hypoglycemia, altered mental status  EXAM: CHEST  2 VIEW  COMPARISON:  PA and lateral chest of October 22, 2014  FINDINGS: The cardiac silhouette is enlarged. The pulmonary vascularity is engorged. The pulmonary interstitial markings are increased. There is are small pleural effusions layering posteriorly. There is no alveolar infiltrate. The bony structures exhibit no acute abnormalities.  IMPRESSION: CHF with pulmonary interstitial edema and small pleural effusions. There is no alveolar pneumonia.   Electronically Signed   By: David  Martinique M.D.   On: 01/16/2015 09:08     Medical Consultants:    None.  Anti-Infectives:    None.  Subjective:   April [name] denies dyspnea.  No chest pain.  No cough.  Feels cold.  Denies any fevers.  No nausea or vomiting today, but felt nauseated yesterday.  Objective:    Filed Vitals:   01/16/15 2102 01/17/15 0509 01/17/15 0737 01/17/15 0748  BP: 136/78 142/69    Pulse: 80 77    Temp: 98.4 F (36.9 C) 98 F (36.7 C)    TempSrc: Oral Oral    Resp: 18 19    Height:      Weight:  59.9 kg (132 lb 0.9 oz)    SpO2: 94% 93% 94% 93%    Intake/Output Summary (Last 24 hours) at 01/17/15 0811 Last data filed at 01/17/15 0700  Gross per 24 hour  Intake    360 ml  Output    375 ml  Net    -  15 ml    Exam: Gen:  NAD Cardiovascular:  RRR, No M/R/G Respiratory:  Lungs CTAB Gastrointestinal:  Abdomen soft, NT/ND, + BS Extremities:  No C/E/C   Data Reviewed:    Labs: Basic Metabolic Panel:  Recent Labs Lab 01/16/15 0842 01/17/15 0405  NA 143 143  K 3.9 3.7  CL 106 101  CO2 29 29  GLUCOSE 82 173*  BUN 14 17  CREATININE 0.56 0.77  CALCIUM 8.8* 9.1   GFR Estimated Creatinine Clearance: 46.6 mL/min (by C-G formula based on Cr of 0.77). Liver Function Tests:  Recent Labs Lab 01/17/15 0405  AST 17  ALT 16    ALKPHOS 97  BILITOT 0.5  PROT 7.3  ALBUMIN 3.4*   Coagulation profile  Recent Labs Lab 01/17/15 0405  INR 1.19    CBC:  Recent Labs Lab 01/16/15 0842 01/17/15 0405  WBC 8.7 8.9  NEUTROABS 5.7  --   HGB 12.0 11.9*  HCT 38.3 37.1  MCV 94.8 94.6  PLT 432* 417*   CBG:  Recent Labs Lab 01/16/15 0813 01/16/15 1416 01/16/15 1639 01/16/15 2054  GLUCAP 90 110* 152* 171*    Microbiology No results found for this or any previous visit (from the past 240 hour(s)).   Medications:   . aspirin EC  325 mg Oral Daily  . atorvastatin  20 mg Oral q1800  . carvedilol  3.125 mg Oral BID WC  . enoxaparin (LOVENOX) injection  40 mg Subcutaneous Q24H  . furosemide  40 mg Intravenous BID  . insulin aspart  0-15 Units Subcutaneous TID WC  . insulin aspart  0-5 Units Subcutaneous QHS  . isosorbide dinitrate  5 mg Oral TID  . levETIRAcetam  500 mg Oral BID  . pantoprazole  40 mg Oral Daily  . senna-docusate  1 tablet Oral BID   Continuous Infusions:   Time spent: 35 minutes with > 50% of time discussing current diagnostic test results, clinical impression and plan of care.   LOS: 1 day   RAMA,CHRISTINA  [facility] Pager [phone]. If unable to reach me by pager, please call my cell phone at [phone].  *Please refer to [email or web address], password [email or web address] to get updated schedule on who will round on this patient, as hospitalists switch teams weekly. If 7PM-7AM, please contact night-coverage at [email or web address], password [email or web address] for any overnight needs.  01/17/2015, 8:11 AM

## 2015-01-18 ENCOUNTER — Encounter (HOSPITAL_COMMUNITY): Payer: Self-pay | Admitting: Internal Medicine

## 2015-01-18 DIAGNOSIS — I5032 Chronic diastolic (congestive) heart failure: Secondary | ICD-10-CM

## 2015-01-18 HISTORY — DX: Chronic diastolic (congestive) heart failure: I50.32

## 2015-01-18 LAB — BASIC METABOLIC PANEL
Anion gap: 10 (ref 5–15)
BUN: 16 mg/dL (ref 6–20)
CALCIUM: 8.7 mg/dL — AB (ref 8.9–10.3)
CO2: 30 mmol/L (ref 22–32)
Chloride: 101 mmol/L (ref 101–111)
Creatinine, Ser: 0.77 mg/dL (ref 0.44–1.00)
GFR calc Af Amer: >60 mL/min (ref >60)
GLUCOSE: 144 mg/dL — AB (ref 65–99)
Potassium: 3.6 mmol/L (ref 3.5–5.1)
Sodium: 141 mmol/L (ref 135–145)

## 2015-01-18 LAB — HEMOGLOBIN A1C
HEMOGLOBIN A1C: 7.4 % — AB (ref 4.8–5.6)
MEAN PLASMA GLUCOSE: 166 mg/dL

## 2015-01-18 LAB — GLUCOSE, CAPILLARY: Glucose-Capillary: 160 mg/dL — ABNORMAL HIGH (ref 65–99)

## 2015-01-18 MED ORDER — ISOSORBIDE DINITRATE 5 MG PO TABS: 5.0000 mg | ORAL_TABLET | Freq: Three times a day (TID) | ORAL | Status: DC

## 2015-01-18 MED ORDER — FUROSEMIDE 20 MG PO TABS: ORAL_TABLET | ORAL | Status: DC

## 2015-01-18 MED ORDER — CARVEDILOL 3.125 MG PO TABS
3.2500 mg | ORAL_TABLET | Freq: Two times a day (BID) | ORAL | Status: DC
Start: 2015-01-18 — End: 2015-08-27

## 2015-01-18 MED ORDER — INSULIN DETEMIR 100 UNIT/ML ~~LOC~~ SOLN
20.0000 [IU] | Freq: Every day | SUBCUTANEOUS | Status: DC
Start: 2015-01-18 — End: 2015-02-13

## 2015-01-18 MED ORDER — FUROSEMIDE 40 MG PO TABS: ORAL_TABLET | ORAL | Status: DC

## 2015-01-18 NOTE — Progress Notes (Signed)
Pt is being D/C back to [facility]. Report was called and given. All questions answered.

## 2015-01-18 NOTE — Discharge Instructions (Signed)
Hypoglycemia °Hypoglycemia occurs when the glucose in your blood is too low. Glucose is a type of sugar that is your body's main energy source. Hormones, such as insulin and glucagon, control the level of glucose in the blood. Insulin lowers blood glucose and glucagon increases blood glucose. Having too much insulin in your blood stream, or not eating enough food containing sugar, can result in hypoglycemia. Hypoglycemia can happen to people with or without diabetes. It can develop quickly and can be a medical emergency.  °CAUSES  °· Missing or delaying meals. °· Not eating enough carbohydrates at meals. °· Taking too much diabetes medicine. °· Not timing your oral diabetes medicine or insulin doses with meals, snacks, and exercise. °· Nausea and vomiting. °· Certain medicines. °· Severe illnesses, such as hepatitis, kidney disorders, and certain eating disorders. °· Increased activity or exercise without eating something extra or adjusting medicines. °· Drinking too much alcohol. °· A nerve disorder that affects body functions like your heart rate, blood pressure, and digestion (autonomic neuropathy). °· A condition where the stomach muscles do not function properly (gastroparesis). Therefore, medicines and food may not absorb properly. °· Rarely, a tumor of the pancreas can produce too much insulin. °SYMPTOMS  °· Hunger. °· Sweating (diaphoresis). °· Change in body temperature. °· Shakiness. °· Headache. °· Anxiety. °· Lightheadedness. °· Irritability. °· Difficulty concentrating. °· Dry mouth. °· Tingling or numbness in the hands or feet. °· Restless sleep or sleep disturbances. °· Altered speech and coordination. °· Change in mental status. °· Seizures or prolonged convulsions. °· Combativeness. °· Drowsiness (lethargic). °· Weakness. °· Increased heart rate or palpitations. °· Confusion. °· Pale, gray skin color. °· Blurred or double vision. °· Fainting. °DIAGNOSIS  °A physical exam and medical history will be  performed. Your caregiver may make a diagnosis based on your symptoms. Blood tests and other lab tests may be performed to confirm a diagnosis. Once the diagnosis is made, your caregiver will see if your signs and symptoms go away once your blood glucose is raised.  °TREATMENT  °Usually, you can easily treat your hypoglycemia when you notice symptoms. °· Check your blood glucose. If it is less than 70 mg/dl, take one of the following:   °¨ 3-4 glucose tablets.   °¨ ½ cup juice.   °¨ ½ cup regular soda.   °¨ 1 cup skim milk.   °¨ ½-1 tube of glucose gel.   °¨ 5-6 hard candies.   °· Avoid high-fat drinks or food that may delay a rise in blood glucose levels. °· Do not take more than the recommended amount of sugary foods, drinks, gel, or tablets. Doing so will cause your blood glucose to go too high.   °· Wait 10-15 minutes and recheck your blood glucose. If it is still less than 70 mg/dl or below your target range, repeat treatment.   °· Eat a snack if it is more than 1 hour until your next meal.   °There may be a time when your blood glucose may go so low that you are unable to treat yourself at home when you start to notice symptoms. You may need someone to help you. You may even faint or be unable to swallow. If you cannot treat yourself, someone will need to bring you to the hospital.  °HOME CARE INSTRUCTIONS °· If you have diabetes, follow your diabetes management plan by: °¨ Taking your medicines as directed. °¨ Following your exercise plan. °¨ Following your meal plan. Do not skip meals. Eat on time. °¨ Testing your blood   glucose regularly. Check your blood glucose before and after exercise. If you exercise longer or different than usual, be sure to check blood glucose more frequently. °¨ Wearing your medical alert jewelry that says you have diabetes. °· Identify the cause of your hypoglycemia. Then, develop ways to prevent the recurrence of hypoglycemia. °· Do not take a hot bath or shower right after an  insulin shot. °· Always carry treatment with you. Glucose tablets are the easiest to carry. °· If you are going to drink alcohol, drink it only with meals. °· Tell friends or family members ways to keep you safe during a seizure. This may include removing hard or sharp objects from the area or turning you on your side. °· Maintain a healthy weight. °SEEK MEDICAL CARE IF:  °· You are having problems keeping your blood glucose in your target range. °· You are having frequent episodes of hypoglycemia. °· You feel you might be having side effects from your medicines. °· You are not sure why your blood glucose is dropping so low. °· You notice a change in vision or a new problem with your vision. °SEEK IMMEDIATE MEDICAL CARE IF:  °· Confusion develops. °· A change in mental status occurs. °· The inability to swallow develops. °· Fainting occurs. °Document Released: 08/04/2005 Document Revised: 08/09/2013 Document Reviewed: 12/01/2011 °ExitCare® Patient Information ©2015 [employer]. This information is not intended to replace advice given to you by your health care provider. Make sure you discuss any questions you have with your health care provider. ° °

## 2015-01-18 NOTE — Care Management Note (Signed)
Case Management Note  Patient Details  Name: April [name] MRN: [id number] Date of Birth: 05-31-1941  Subjective/Objective:                    Action/Plan:d/c abck to ALF-[facility] HHRN/PT/OT/ST.   Expected Discharge Date:   (UNKNOWN)               Expected Discharge Plan:  Assisted Living / Rest Home  In-House Referral:  Clinical Social Work  Discharge planning Services  CM Consult  Post Acute Care Choice:    Choice offered to:     DME Arranged:    DME Agency:     HH Arranged:  PT, OT, Speech Therapy, RN ([name] HHRN/PT/OT/ST.) HH Agency:  Other - See comment, [facility] HHRN/PT/OT/ST)  Status of Service:  Completed, signed off  Medicare Important Message Given:    Date Medicare IM Given:    Medicare IM give by:    Date Additional Medicare IM Given:    Additional Medicare Important Message give by:     If discussed at [place] of Stay Meetings, dates discussed:    Additional Comments:  Dessa Phi, RN 01/18/2015, 9:39 AM

## 2015-01-18 NOTE — Care Management Note (Signed)
Case Management Note  Patient Details  Name: April [name] MRN: [id number] Date of Birth: Jan 11, 1941  Subjective/Objective:                    Action/Plan:d/c back to ALF wHHPT.   Expected Discharge Date:   (UNKNOWN)               Expected Discharge Plan:  Assisted Living / Rest Home  In-House Referral:  Clinical Social Work  Discharge planning Services  CM Consult  Post Acute Care Choice:    Choice offered to:     DME Arranged:    DME Agency:     HH Arranged:  PT ([name]-has their own PT-add to [facility].CSW notified.) [place] Agency:  Other - See comment ([place] tel#[phone])  Status of Service:  Completed, signed off  Medicare Important Message Given:    Date Medicare IM Given:    Medicare IM give by:    Date Additional Medicare IM Given:    Additional Medicare Important Message give by:     If discussed at [place] of Stay Meetings, dates discussed:    Additional Comments:  Dessa Phi, RN 01/18/2015, 9:32 AM

## 2015-01-18 NOTE — Discharge Summary (Signed)
Physician Discharge Summary  DUA MEHLER XHB:[id number] DOB: [birth year] DOA: 01/16/2015  PCP: Durenda Age, NP  Admit date: 01/16/2015 Discharge date: 01/18/2015   Recommendations for Outpatient Follow-Up:   To prevent exacerbations of your heart failure, it is important that her weight is checked at the same time every day, and that if she gains over 3 pounds in 24 hours or 5 lbs in 1 week, OR you develop worsening swelling to the legs, experience more shortness of breath or chest pain, that she is given an extra dose of Lasix and & that her Primary MD or cardiologist is notfied. BMET should be checked on 01/21/15 and weekly thereafter until renal function proves stable on diuretic therapy. She should follow a heart healthy, low salt diet and restrict her fluid intake to 1.5 liters a day or less. Her Levemir dose was decreased due to a.m. Hypoglycemia.  If this persists, patient may benefit from further dose reduction and the addition of NovoLOG for meal coverage.   Discharge Diagnosis:   Principal Problem:    Acute respiratory failure with hypoxia Active Problems:    HYPERTENSION, BENIGN SYSTEMIC    Uncontrolled type 2 diabetes mellitus with insulin therapy    Hyperlipidemia    Cerebrovascular disease    Seizure disorder    Diastolic CHF, acute on chronic   Discharge disposition:  ALF: [facility].  Discharge Condition: Improved.  Diet recommendation: Low sodium, heart healthy.  Carbohydrate-modified.     History of Present Illness:   April [name] is an 74 y.o. female with a PMH of uncontrolled insulin-dependent diabetes, hypertension, depression, CVA, and seizure disorder who was admitted 01/16/15 with transient alteration of mental status and hypoglycemia. While in the ED, the patient was noted to be hypoxic with an O2 saturation of 88% on room air with ambulation. Chest x-ray was concerning for CHF.  Hospital Course by Problem:   Principal  Problem:  Acute respiratory failure with hypoxia secondary to acute on chronic diastolic CHF - Chest x-ray consistent with CHF (pulmonary interstitial edema and small pleural effusions). - Last echo done 10/23/14. EF 55-60 percent with grade 1 diastolic dysfunction. - Diuresed with a 4 lb weight loss.  Continue Lasix at 40 mg daily. - Weaned off oxygen, dyspnea improved.  Active Problems:  Hypoglycemia / Uncontrolled type 2 diabetes mellitus with insulin therapy - Currently being managed with moderate scale SSI. CBGs 153-184. - Outpatient regimen: Levemir 25 units daily at bedtime, Amaryl 2 mg daily. - Levemir and Amaryl currently on hold.  Resume Levemir at 20 units daily & Amaryl.   HYPERTENSION, BENIGN SYSTEMIC - Antihypertensives adjusted. Norvasc and hydralazine discontinued. Coreg/Isordil dosages reduced. - Blood pressure currently controlled with the addition of Lasix.   Hyperlipidemia - Continue statin.   Cerebrovascular disease - Continue aspirin. - Continue risk factor modification with blood pressure, glycemic, and lipid control.   Seizure disorder - Continue Keppra.    Medical Consultants:    None.   Discharge Exam:   Filed Vitals:   01/18/15 0455  BP: 124/56  Pulse: 81  Temp: 99 F (37.2 C)  Resp: 20   Filed Vitals:   01/17/15 0943 01/17/15 1354 01/17/15 2130 01/18/15 0455  BP: 137/60 137/69 125/60 124/56  Pulse: 76 75 82 81  Temp: 98.3 F (36.8 C) 98.8 F (37.1 C) 99 F (37.2 C) 99 F (37.2 C)  TempSrc: Oral Oral Oral Oral  Resp: 18 20 20 20   Height:      Weight:  58.6 kg (129 lb 3 oz)  SpO2: 91% 90% 93% 92%    Gen:  NAD Cardiovascular:  RRR, No M/R/G Respiratory: Lungs with a few bibasilar crackles Gastrointestinal: Abdomen soft, NT/ND with normal active bowel sounds. Extremities: No C/E/C   The results of significant diagnostics from this hospitalization (including imaging, microbiology, ancillary and laboratory) are listed  below for reference.     Procedures and Diagnostic Studies:   Dg Chest 2 View  01/16/2015   CLINICAL DATA:  Hypoglycemia, altered mental status  EXAM: CHEST  2 VIEW  COMPARISON:  PA and lateral chest of October 22, 2014  FINDINGS: The cardiac silhouette is enlarged. The pulmonary vascularity is engorged. The pulmonary interstitial markings are increased. There is are small pleural effusions layering posteriorly. There is no alveolar infiltrate. The bony structures exhibit no acute abnormalities.  IMPRESSION: CHF with pulmonary interstitial edema and small pleural effusions. There is no alveolar pneumonia.   Electronically Signed   By: David  Martinique M.D.   On: 01/16/2015 09:08   Mr Cervical Spine Wo Contrast  01/01/2015     [facility] [address], [place], [place] [address] [phone]  NEUROIMAGING REPORT   STUDY DATE: 12/31/2014 PATIENT NAME: SHATONIA HOOTS DOB: 1940/10/27 MRN: [id number]  EXAM: MRI of the cervical spine without contrast  ORDERING CLINICIAN: Alvino Blood M.D. PhD CLINICAL HISTORY: 74 year old woman with gait abnormality COMPARISON FILMS: None  TECHNIQUE: MRI of the cervical spine was obtained utilizing 3 mm sagittal  slices from the posterior fossa down to the T3-4 level with T1, T2 and  inversion recovery views. In addition 4 mm axial slices from U1-3 down to  T1-2 level were included with T2 and gradient echo views. CONTRAST: None IMAGING SITE: [facility],  [address]  FINDINGS: :  On sagittal images, the spine is imaged from above the  cervicomedullary junction to T3.   The lateral ventricles appear enlarged.  There appears to be a chronic lacunar infarction in the right pons. The  spinal cord is of normal caliber and signal.   The cervicomedullary  junction appears normal. The vertebral bodies are normally aligned.    There is severe loss of disc height at C5-C6 and mild loss of disc height  at C6-C7. There is a small cyst within the C6  vertebral body. Otherwise,  the vertebral bodies have normal signal.  The discs and interspaces were  further evaluated on axial views from C2 to T1 as follows:  C2 - C3:  The disc appears normal. There is mild left facet hypertrophy.  The neural foramina are widely patent and there is no nerve root  impingement. C3 - C4:  The disc and interspace appear normal. C4 - C5:  The disc and interspace appear normal. C5 - C6:  There is bilateral uncovertebral spurring and broad disc  protrusion. The central canal is mildly narrowed but not enough to be  considered spinal stenosis.  Neural foramina are minimally narrowed and  there is no nerve root impingement.. C6 - C7:  There is mild disc bulging but no significant foraminal  narrowing and no nerve root impingement.. C7 - T1:  The disc and interspace appear normal.   01/01/2015    This an abnormal MRI of the cervical spine showing  degenerative changes predominantly at C5-C6 where there is bilateral  uncovertebral spurring, more disc protrusion and reduced disc height. The  neural foramina are just minimally narrowed at that level and there  is no  nerve root impingement. Milder degenerative changes are noted at C2-C3 and  C6-C7.  On the sagittal images, there is also evidence of chronic right  pontine lacunar and enlarged lateral ventricles.    INTERPRETING PHYSICIAN:  Richard A. Felecia Shelling, MD, PhD Certified in  Neuroimaging by [place] of [employer]     Mr Lumbar Spine Wo Contrast  01/01/2015     [facility] [address] [place], [place] [address] [phone]  NEUROIMAGING REPORT   STUDY DATE: 12/31/2014 PATIENT NAME: YEILA MORRO DOB: [birth year] MRN: [id number]  EXAM: MRI of the lumbar spine without contrast  ORDERING CLINICIAN: Marcial Pacas M.D. PhD CLINICAL HISTORY: 74 year old woman with gait abnormality COMPARISON FILMS: None  TECHNIQUE: MRI of the lumbar spine was obtained utilizing 4 mm sagittal  slices from B51-02 down to  the lower sacrum with T1, T2 and inversion  recovery views. In addition 4 mm axial slices from H8-5 down to L5-S1  level were included with T1 and T2 weighted views. CONTRAST: None IMAGING SITE: [facility],  [address]  FINDINGS: On sagittal images, the spine is imaged from T11 to the sacrum.    The conus medullaris and cauda equine appear normal.   The vertebral  bodies are normally aligned.   The vertebral bodies have normal signal.   The discs and interspaces were further evaluated on axial views from L1 to  S1 as follows:  L1 - L2:  The disc and interspace appear normal. L2 - L3:  The disc and interspace appear normal. L3 - L4:  There is minimal facet hypertrophy. Disc appears normal for age.  The neural foramina are widely patent and there is no nerve root  impingement.. L4 - L5:  There is a broad disc protrusion, minimal facet hypertrophy with  left ligament of flavum hypertrophy and possibly a tiny synovial cyst on  the left. The neural foramina are minimally narrowed and the L4 nerve  roots exit without compromise. There is moderate left lateral recess  stenosis.  There is no definite nerve root compression though there is  some encroachment on the traversing left L5 nerve root. L5 - S1:  The disc appears normal. There is bilateral facet hypertrophy.  The neural foramina and lateral recesses are not narrowed and there is no  nerve root impingement..    01/01/2015    This is an abnormal MRI of the lumbar spine showing  degenerative changes at L3-S1 as described above. The most significant  findings are at L4-L5 with there is disc protrusion, facet hypertrophy and  left ligamentum flavum hypertrophy with possible synovial cyst. This  causes left lateral recess stenosis that could lead to dynamic impingement  of the left L5 nerve root.    No spinal stenosis is noted. No acute  findings are noted.    INTERPRETING PHYSICIAN:  Richard A. Felecia Shelling, MD, PhD Certified in  Neuroimaging by [place]  of Neuroimaging       Labs:   Basic Metabolic Panel:  Recent Labs Lab 01/16/15 0842 01/17/15 0405 01/18/15 0500  NA 143 143 141  K 3.9 3.7 3.6  CL 106 101 101  CO2 29 29 30   GLUCOSE 82 173* 144*  BUN 14 17 16   CREATININE 0.56 0.77 0.77  CALCIUM 8.8* 9.1 8.7*   GFR Estimated Creatinine Clearance: 46.1 mL/min (by C-G formula based on Cr of 0.77). Liver Function Tests:  Recent Labs Lab 01/17/15 0405  AST 17  ALT 16  ALKPHOS 97  BILITOT 0.5  PROT 7.3  ALBUMIN 3.4*   Coagulation profile  Recent Labs Lab 01/17/15 0405  INR 1.19    CBC:  Recent Labs Lab 01/16/15 0842 01/17/15 0405  WBC 8.7 8.9  NEUTROABS 5.7  --   HGB 12.0 11.9*  HCT 38.3 37.1  MCV 94.8 94.6  PLT 432* 417*   CBG:  Recent Labs Lab 01/16/15 2054 01/17/15 0726 01/17/15 1157 01/17/15 1732 01/17/15 2128  GLUCAP 171* 153* 172* 177* 184*  Hgb A1c  Recent Labs  01/16/15 0847  HGBA1C 7.4*    Discharge Instructions:   Discharge Instructions    (HEART FAILURE PATIENTS) Call MD:  Anytime you have any of the following symptoms: 1) 3 pound weight gain in 24 hours or 5 pounds in 1 week 2) shortness of breath, with or without a dry hacking cough 3) swelling in the hands, feet or stomach 4) if you have to sleep on extra pillows at night in order to breathe.    Complete by:  As directed      Call MD for:    Complete by:  As directed   If blood sugars are < 70.     Diet - low sodium heart healthy    Complete by:  As directed      Diet Carb Modified    Complete by:  As directed      Increase activity slowly    Complete by:  As directed             Medication List    STOP taking these medications        amLODipine 10 MG tablet  Commonly known as:  NORVASC     hydrALAZINE 25 MG tablet  Commonly known as:  APRESOLINE     isosorbide-hydrALAZINE 20-37.5 MG per tablet  Commonly known as:  BIDIL      TAKE these medications        aspirin EC 325 MG tablet  Take 1 tablet (325  mg total) by mouth daily.     atorvastatin 20 MG tablet  Commonly known as:  LIPITOR  Take 1 tablet (20 mg total) by mouth daily at 6 PM.     carvedilol 3.125 MG tablet  Commonly known as:  COREG  Take 1 tablet (3.125 mg total) by mouth 2 (two) times daily with a meal.     furosemide 40 MG tablet  Commonly known as:  LASIX  Take 1 tablet daily. If you gain > 3lbs in 1 day or 5 lbs in 1 week, take an extra dose.     glimepiride 2 MG tablet  Commonly known as:  AMARYL  Take 1 tablet (2 mg total) by mouth daily with breakfast.     insulin detemir 100 UNIT/ML injection  Commonly known as:  LEVEMIR  Inject 0.2 mLs (20 Units total) into the skin daily.     isosorbide dinitrate 5 MG tablet  Commonly known as:  ISORDIL  Take 1 tablet (5 mg total) by mouth 3 (three) times daily. Take 1 tablet by mouth three times a day     levETIRAcetam 500 MG tablet  Commonly known as:  KEPPRA  Take 1 tablet (500 mg total) by mouth 2 (two) times daily.     ondansetron 8 MG disintegrating tablet  Commonly known as:  ZOFRAN ODT  Take 1 tablet (8 mg total) by mouth every 8 (eight) hours as needed for nausea or vomiting.  pantoprazole 40 MG tablet  Commonly known as:  PROTONIX  Take 1 tablet (40 mg total) by mouth daily.     potassium chloride SA 20 MEQ tablet  Commonly known as:  K-DUR,KLOR-CON  Take 2 tablets (40 mEq total) by mouth daily.     PROTEIN PO  Take 2 scoop by mouth 2 (two) times daily. *Procell Protein Supplement*           Follow-up Information    Follow up with [facility].   Why:  HHPT   Contact information:   [place] [address]      Follow up with [facility], NP.   Specialty:  Internal Medicine   Why:  Within 1 week at your facility.   Contact information:   [address] [place] [address] [phone]        Time coordinating discharge: 35  minutes.  Signed:  Loda Bialas  Pager [phone] [facility] 01/18/2015, 9:26 AM

## 2015-01-18 NOTE — Progress Notes (Signed)
Patient is set to discharge back to [facility] today. CSW confirmed with Shawna at ALF that patient is ok to return today. Patient & niece at bedside aware. Discharge packet given to RN, Catie. Patient's niece to transport back to ALF.    Raynaldo Opitz, [facility] Clinical Social Worker cell #: [phone]

## 2015-01-19 ENCOUNTER — Ambulatory Visit: Payer: Medicare HMO | Admitting: Neurology

## 2015-02-07 ENCOUNTER — Emergency Department (HOSPITAL_COMMUNITY): Payer: Medicare HMO

## 2015-02-07 ENCOUNTER — Inpatient Hospital Stay (HOSPITAL_COMMUNITY)
Admission: EM | Admit: 2015-02-07 | Discharge: 2015-02-13 | DRG: 872 | Disposition: A | Discharge status: Nursing Home (Includes ICF) | Payer: Medicare HMO | Attending: Internal Medicine | Admitting: Internal Medicine

## 2015-02-07 ENCOUNTER — Encounter (HOSPITAL_COMMUNITY): Payer: Self-pay | Admitting: Emergency Medicine

## 2015-02-07 DIAGNOSIS — I5032 Chronic diastolic (congestive) heart failure: Secondary | ICD-10-CM | POA: Diagnosis present

## 2015-02-07 DIAGNOSIS — N1330 Unspecified hydronephrosis: Secondary | ICD-10-CM

## 2015-02-07 DIAGNOSIS — B964 Proteus (mirabilis) (morganii) as the cause of diseases classified elsewhere: Secondary | ICD-10-CM | POA: Diagnosis present

## 2015-02-07 DIAGNOSIS — A419 Sepsis, unspecified organism: Secondary | ICD-10-CM | POA: Diagnosis not present

## 2015-02-07 DIAGNOSIS — Z833 Family history of diabetes mellitus: Secondary | ICD-10-CM

## 2015-02-07 DIAGNOSIS — Z794 Long term (current) use of insulin: Secondary | ICD-10-CM

## 2015-02-07 DIAGNOSIS — F329 Major depressive disorder, single episode, unspecified: Secondary | ICD-10-CM | POA: Diagnosis present

## 2015-02-07 DIAGNOSIS — I959 Hypotension, unspecified: Secondary | ICD-10-CM | POA: Diagnosis present

## 2015-02-07 DIAGNOSIS — N39490 Overflow incontinence: Secondary | ICD-10-CM | POA: Diagnosis present

## 2015-02-07 DIAGNOSIS — R0902 Hypoxemia: Secondary | ICD-10-CM | POA: Diagnosis present

## 2015-02-07 DIAGNOSIS — N12 Tubulo-interstitial nephritis, not specified as acute or chronic: Secondary | ICD-10-CM | POA: Diagnosis present

## 2015-02-07 DIAGNOSIS — K567 Ileus, unspecified: Secondary | ICD-10-CM | POA: Diagnosis not present

## 2015-02-07 DIAGNOSIS — E876 Hypokalemia: Secondary | ICD-10-CM | POA: Diagnosis present

## 2015-02-07 DIAGNOSIS — H3500 Unspecified background retinopathy: Secondary | ICD-10-CM | POA: Diagnosis present

## 2015-02-07 DIAGNOSIS — K5900 Constipation, unspecified: Secondary | ICD-10-CM

## 2015-02-07 DIAGNOSIS — N390 Urinary tract infection, site not specified: Secondary | ICD-10-CM | POA: Diagnosis present

## 2015-02-07 DIAGNOSIS — Z800 Family history of malignant neoplasm of digestive organs: Secondary | ICD-10-CM

## 2015-02-07 DIAGNOSIS — Z90710 Acquired absence of both cervix and uterus: Secondary | ICD-10-CM

## 2015-02-07 DIAGNOSIS — E785 Hyperlipidemia, unspecified: Secondary | ICD-10-CM | POA: Diagnosis present

## 2015-02-07 DIAGNOSIS — Z8249 Family history of ischemic heart disease and other diseases of the circulatory system: Secondary | ICD-10-CM

## 2015-02-07 DIAGNOSIS — G40909 Epilepsy, unspecified, not intractable, without status epilepticus: Secondary | ICD-10-CM | POA: Diagnosis present

## 2015-02-07 DIAGNOSIS — R509 Fever, unspecified: Secondary | ICD-10-CM | POA: Diagnosis not present

## 2015-02-07 DIAGNOSIS — M545 Low back pain: Secondary | ICD-10-CM | POA: Diagnosis present

## 2015-02-07 DIAGNOSIS — R4701 Aphasia: Secondary | ICD-10-CM | POA: Diagnosis present

## 2015-02-07 DIAGNOSIS — N202 Calculus of kidney with calculus of ureter: Secondary | ICD-10-CM | POA: Diagnosis present

## 2015-02-07 DIAGNOSIS — R1903 Right lower quadrant abdominal swelling, mass and lump: Secondary | ICD-10-CM

## 2015-02-07 DIAGNOSIS — Z8673 Personal history of transient ischemic attack (TIA), and cerebral infarction without residual deficits: Secondary | ICD-10-CM

## 2015-02-07 DIAGNOSIS — I10 Essential (primary) hypertension: Secondary | ICD-10-CM | POA: Diagnosis present

## 2015-02-07 DIAGNOSIS — E11649 Type 2 diabetes mellitus with hypoglycemia without coma: Secondary | ICD-10-CM | POA: Diagnosis present

## 2015-02-07 DIAGNOSIS — Z7982 Long term (current) use of aspirin: Secondary | ICD-10-CM

## 2015-02-07 DIAGNOSIS — R10819 Abdominal tenderness, unspecified site: Secondary | ICD-10-CM

## 2015-02-07 DIAGNOSIS — R197 Diarrhea, unspecified: Secondary | ICD-10-CM | POA: Diagnosis present

## 2015-02-07 DIAGNOSIS — E1165 Type 2 diabetes mellitus with hyperglycemia: Secondary | ICD-10-CM | POA: Diagnosis present

## 2015-02-07 DIAGNOSIS — N179 Acute kidney failure, unspecified: Secondary | ICD-10-CM | POA: Diagnosis present

## 2015-02-07 DIAGNOSIS — R140 Abdominal distension (gaseous): Secondary | ICD-10-CM

## 2015-02-07 DIAGNOSIS — K219 Gastro-esophageal reflux disease without esophagitis: Secondary | ICD-10-CM | POA: Diagnosis present

## 2015-02-07 DIAGNOSIS — N136 Pyonephrosis: Secondary | ICD-10-CM | POA: Diagnosis present

## 2015-02-07 DIAGNOSIS — N201 Calculus of ureter: Secondary | ICD-10-CM

## 2015-02-07 LAB — CBC WITH DIFFERENTIAL/PLATELET
BASOS PCT: 0 % (ref 0–1)
Basophils Absolute: 0 10*3/uL (ref 0.0–0.1)
Eosinophils Absolute: 0 10*3/uL (ref 0.0–0.7)
Eosinophils Relative: 0 % (ref 0–5)
HCT: 37.4 % (ref 36.0–46.0)
Hemoglobin: 12.1 g/dL (ref 12.0–15.0)
Lymphocytes Relative: 8 % — ABNORMAL LOW (ref 12–46)
Lymphs Abs: 2.7 10*3/uL (ref 0.7–4.0)
MCH: 29.1 pg (ref 26.0–34.0)
MCHC: 32.4 g/dL (ref 30.0–36.0)
MCV: 89.9 fL (ref 78.0–100.0)
MONOS PCT: 11 % (ref 3–12)
Monocytes Absolute: 3.7 10*3/uL — ABNORMAL HIGH (ref 0.1–1.0)
NEUTROS ABS: 26.8 10*3/uL — AB (ref 1.7–7.7)
Neutrophils Relative %: 81 % — ABNORMAL HIGH (ref 43–77)
Platelets: 383 10*3/uL (ref 150–400)
RBC: 4.16 MIL/uL (ref 3.87–5.11)
RDW: 13.8 % (ref 11.5–15.5)
WBC: 33.2 10*3/uL — ABNORMAL HIGH (ref 4.0–10.5)

## 2015-02-07 LAB — CBG MONITORING, ED: Glucose-Capillary: 169 mg/dL — ABNORMAL HIGH (ref 65–99)

## 2015-02-07 LAB — URINE MICROSCOPIC-ADD ON

## 2015-02-07 LAB — COMPREHENSIVE METABOLIC PANEL
ALT: 15 U/L (ref 14–54)
ANION GAP: 9 (ref 5–15)
AST: 23 U/L (ref 15–41)
Albumin: 2.9 g/dL — ABNORMAL LOW (ref 3.5–5.0)
Alkaline Phosphatase: 85 U/L (ref 38–126)
BILIRUBIN TOTAL: 0.8 mg/dL (ref 0.3–1.2)
BUN: 23 mg/dL — AB (ref 6–20)
CHLORIDE: 101 mmol/L (ref 101–111)
CO2: 30 mmol/L (ref 22–32)
Calcium: 8.4 mg/dL — ABNORMAL LOW (ref 8.9–10.3)
Creatinine, Ser: 1.26 mg/dL — ABNORMAL HIGH (ref 0.44–1.00)
GFR calc Af Amer: 48 mL/min — ABNORMAL LOW (ref >60)
GFR, EST NON AFRICAN AMERICAN: 41 mL/min — AB (ref >60)
Glucose, Bld: 173 mg/dL — ABNORMAL HIGH (ref 65–99)
POTASSIUM: 3.9 mmol/L (ref 3.5–5.1)
SODIUM: 140 mmol/L (ref 135–145)
Total Protein: 7.1 g/dL (ref 6.5–8.1)

## 2015-02-07 LAB — URINALYSIS, ROUTINE W REFLEX MICROSCOPIC
BILIRUBIN URINE: NEGATIVE
GLUCOSE, UA: NEGATIVE mg/dL
Ketones, ur: NEGATIVE mg/dL
Nitrite: NEGATIVE
PH: 7 (ref 5.0–8.0)
Protein, ur: 100 mg/dL — AB
SPECIFIC GRAVITY, URINE: 1.015 (ref 1.005–1.030)
Urobilinogen, UA: 1 mg/dL (ref 0.0–1.0)

## 2015-02-07 LAB — LIPASE, BLOOD: LIPASE: 13 U/L — AB (ref 22–51)

## 2015-02-07 LAB — I-STAT CG4 LACTIC ACID, ED: Lactic Acid, Venous: 1.86 mmol/L (ref 0.5–2.0)

## 2015-02-07 MED ORDER — CEFTRIAXONE SODIUM 1 G IJ SOLR
1.0000 g | Freq: Once | INTRAMUSCULAR | Status: AC
  Administered 2015-02-07: 1 g via INTRAVENOUS
  Filled 2015-02-07: qty 10

## 2015-02-07 MED ORDER — ONDANSETRON HCL 4 MG/2ML IJ SOLN
4.0000 mg | Freq: Once | INTRAMUSCULAR | Status: AC
  Administered 2015-02-07: 4 mg via INTRAVENOUS
  Filled 2015-02-07: qty 2

## 2015-02-07 MED ORDER — IOHEXOL 300 MG/ML  SOLN
100.0000 mL | Freq: Once | INTRAMUSCULAR | Status: AC | PRN
  Administered 2015-02-07: 80 mL via INTRAVENOUS

## 2015-02-07 MED ORDER — SODIUM CHLORIDE 0.9 % IV BOLUS (SEPSIS)
500.0000 mL | Freq: Once | INTRAVENOUS | Status: AC
  Administered 2015-02-07: 500 mL via INTRAVENOUS

## 2015-02-07 MED ORDER — IOHEXOL 300 MG/ML  SOLN
25.0000 mL | Freq: Once | INTRAMUSCULAR | Status: AC | PRN
  Administered 2015-02-07: 25 mL via ORAL

## 2015-02-07 NOTE — ED Notes (Signed)
[facility] presents with a 74 yo female from [facility] on [address]  [facility] reports fever, N/V/D beginning this am.  Pt was hypotensive at 90/52 until [facility] gave a total of 800 ml of NS; pt BP at 120/59 currently.  CBG 199. $ mg of Zofran given by [facility] IV.  Pt NSR on monitor.  Pt states she had not been able to eat or drink all day because she hasnt felt like it.

## 2015-02-07 NOTE — ED Provider Notes (Signed)
CSN: [id number]     Arrival date & time 02/07/15  2010 History   First MD Initiated Contact with Patient 02/07/15 2032     Chief Complaint  Patient presents with  . Fever  . Nausea  . Emesis  . Diarrhea     (Consider location/radiation/quality/duration/timing/severity/associated sxs/prior Treatment) HPI  April [name] is a 74 y.o. female with PMH of diabetes, hypertension, congestive heart failure, CVA presenting with nausea, vomiting, diarrhea this morning as well as fevers. Patient states she has vomited 5-8 times but denies hematemesis, bile. Patient denies abdominal pain. Patient with dark stool but denies any frank blood. Patient was recently admitted for congestive heart failure 20 days ago. No recent antibiotic use. Abdominal surgeries include hysterectomy and appendectomy. Patient reports decreased appetite. Per EMS patient was hypotensive at 90/52 and given 800 and also normal saline. Current BP 120/59. Patient takes daily aspirin but no anticoagulants.   Past Medical History  Diagnosis Date  . Diabetes mellitus   . Hypertension   . Retinopathy 06/06/2013    Per eye exam 05/31/13 eye exam, Moderate retinopathy   . Aphasia   . Seizures   . Rectum bleeding   . Stroke   . HYPERLIPIDEMIA 10/15/2006    Qualifier: Diagnosis of  By: Beryle Lathe    . GERD (gastroesophageal reflux disease) 05/06/2013  . Gram-positive cocci bacteremia   . Seizure disorder, status epilepticus, nonconvulsive   . Cerebral infarction due to unspecified mechanism   . Uncontrolled type 2 diabetes mellitus with insulin therapy 01/17/2015  . Chronic diastolic CHF (congestive heart failure), grade I 01/18/2015   Past Surgical History  Procedure Laterality Date  . Appendectomy  1972  . Abdominal hysterectomy  1972  . Tee without cardioversion N/A 10/24/2014    Procedure: TRANSESOPHAGEAL ECHOCARDIOGRAM (TEE);  Surgeon: Dorothy Spark, MD;  Location: [facility] ENDOSCOPY;  Service: Cardiovascular;   Laterality: N/A;   Family History  Problem Relation Age of Onset  . Arthritis Maternal Grandmother   . Heart disease Maternal Grandmother 80  . Diabetes Mother   . Stomach cancer Mother    History  Substance Use Topics  . Smoking status: Never Smoker   . Smokeless tobacco: Never Used  . Alcohol Use: No   OB History    No data available     Review of Systems 10 Systems reviewed and are negative for acute change except as noted in the HPI.    Allergies  Codeine and Sulfa antibiotics  Home Medications   Prior to Admission medications   Medication Sig Start Date End Date Taking? Authorizing Provider  aspirin EC 325 MG tablet Take 1 tablet (325 mg total) by mouth daily. 10/25/14  Yes Barton Dubois, MD  atorvastatin (LIPITOR) 20 MG tablet Take 1 tablet (20 mg total) by mouth daily at 6 PM. 10/25/14  Yes Barton Dubois, MD  carvedilol (COREG) 3.125 MG tablet Take 1 tablet (3.125 mg total) by mouth 2 (two) times daily with a meal. 01/18/15  Yes Venetia Maxon Rama, MD  furosemide (LASIX) 40 MG tablet Take 1 tablet daily. If you gain > 3lbs in 1 day or 5 lbs in 1 week, take an extra dose. 01/18/15  Yes Venetia Maxon Rama, MD  glimepiride (AMARYL) 2 MG tablet Take 1 tablet (2 mg total) by mouth daily with breakfast. 10/25/14  Yes Barton Dubois, MD  insulin detemir (LEVEMIR) 100 UNIT/ML injection Inject 0.2 mLs (20 Units total) into the skin daily. 01/18/15  Yes [place],  MD  isosorbide dinitrate (ISORDIL) 5 MG tablet Take 1 tablet (5 mg total) by mouth 3 (three) times daily. Take 1 tablet by mouth three times a day 01/18/15  Yes Venetia Maxon Rama, MD  levETIRAcetam (KEPPRA) 500 MG tablet Take 1 tablet (500 mg total) by mouth 2 (two) times daily. 10/25/14  Yes Barton Dubois, MD  omeprazole (PRILOSEC) 20 MG capsule Take 20 mg by mouth daily.   Yes Historical Provider, MD  ondansetron (ZOFRAN ODT) 8 MG disintegrating tablet Take 1 tablet (8 mg total) by mouth every 8 (eight) hours as needed for nausea or  vomiting. 10/25/14  Yes Barton Dubois, MD  PROTEIN PO Take 2 scoop by mouth 2 (two) times daily. *Procell Protein Supplement*   Yes Historical Provider, MD  pantoprazole (PROTONIX) 40 MG tablet Take 1 tablet (40 mg total) by mouth daily. Patient not taking: Reported on 02/07/2015 10/25/14   Barton Dubois, MD  potassium chloride SA (K-DUR,KLOR-CON) 20 MEQ tablet Take 2 tablets (40 mEq total) by mouth daily. Patient not taking: Reported on 01/16/2015 10/25/14   Barton Dubois, MD   BP 116/53 mmHg  Pulse 87  Temp(Src) 100.3 F (37.9 C) (Rectal)  Resp 17  SpO2 94%  LMP 08/18/1970 Physical Exam  Constitutional: She appears well-developed and well-nourished. No distress.  HENT:  Head: Normocephalic and atraumatic.  Dry mucous membranes.  Eyes: Conjunctivae and EOM are normal. Right eye exhibits no discharge. Left eye exhibits no discharge.  Cardiovascular: Normal rate and regular rhythm.   Pulmonary/Chest: Effort normal and breath sounds normal. No respiratory distress. She has no wheezes.  Abdominal: Soft. Bowel sounds are normal. She exhibits no distension.  Suprapubic and left lower quadrant abdominal tenderness without rebound, rigidity, guarding.  Neurological: She is alert. She exhibits normal muscle tone. Coordination normal.  Skin: Skin is warm and dry. She is not diaphoretic.  Nursing note and vitals reviewed.   ED Course  Procedures (including critical care time) Labs Review Labs Reviewed  CBC WITH DIFFERENTIAL/PLATELET - Abnormal; Notable for the following:    WBC 33.2 (*)    Neutrophils Relative % 81 (*)    Lymphocytes Relative 8 (*)    Neutro Abs 26.8 (*)    Monocytes Absolute 3.7 (*)    All other components within normal limits  COMPREHENSIVE METABOLIC PANEL - Abnormal; Notable for the following:    Glucose, Bld 173 (*)    BUN 23 (*)    Creatinine, Ser 1.26 (*)    Calcium 8.4 (*)    Albumin 2.9 (*)    GFR calc non Af Amer 41 (*)    GFR calc Af Amer 48 (*)    All other  components within normal limits  LIPASE, BLOOD - Abnormal; Notable for the following:    Lipase 13 (*)    All other components within normal limits  URINALYSIS, ROUTINE W REFLEX MICROSCOPIC (NOT AT [facility]) - Abnormal; Notable for the following:    Color, Urine AMBER (*)    APPearance TURBID (*)    Hgb urine dipstick MODERATE (*)    Protein, ur 100 (*)    Leukocytes, UA LARGE (*)    All other components within normal limits  URINE MICROSCOPIC-ADD ON - Abnormal; Notable for the following:    Bacteria, UA FEW (*)    All other components within normal limits  CBG MONITORING, ED - Abnormal; Notable for the following:    Glucose-Capillary 169 (*)    All other components within normal limits  URINE CULTURE  I-STAT CG4 LACTIC ACID, ED    Imaging Review Ct Abdomen Pelvis W Contrast  02/07/2015   CLINICAL DATA:  Patient presents with nausea, vomiting, diarrhea and hypotension.  EXAM: CT ABDOMEN AND PELVIS WITH CONTRAST  TECHNIQUE: Multidetector CT imaging of the abdomen and pelvis was performed using the standard protocol following bolus administration of intravenous contrast.  CONTRAST:  6mL OMNIPAQUE IOHEXOL 300 MG/ML SOLN, 11mL OMNIPAQUE IOHEXOL 300 MG/ML SOLN  COMPARISON:  None.  FINDINGS: Lower chest: Visualized heart is unremarkable. Patchy opacities within the left lung base. No pleural effusion. Incompletely visualized within the left lower lobe (image 1; series 2) is a more masslike focal area of consolidation.  Hepatobiliary: Liver is normal in size and contour without focal hepatic lesion identified. Small gallstone within the gallbladder lumen. Probable layering sludge within the gallbladder lumen. No gallbladder wall thickening.  Pancreas: Unremarkable  Spleen: Unremarkable  Adrenals/Urinary Tract: Right adrenal myelolipoma. Nonobstructing 3 mm stone interpolar region right kidney. Too small to characterize low-attenuation lesion superior pole right kidney. Left-greater-than-right  perinephric fat stranding. There is moderate left hydroureteronephrosis to the level of the urinary bladder were there is an obstructing 5 mm stone. There is circumferential wall thickening of the urinary bladder as well as urothelial hyper-enhancement and thickening of the left and right ureters.  Stomach/Bowel: Large amount of stool within the rectum and colon. No evidence for bowel obstruction. No free fluid or free intraperitoneal air. The stomach is unremarkable.  There is a 1.6 cm enhancing mass immediately adjacent to the terminal ileum which is connected with a right lower quadrant mesenteric mass measuring 3.6 x 2.9 cm (image 46; series 2).  Vascular/Lymphatic: Normal caliber abdominal aorta.  Other: Status post hysterectomy.  Musculoskeletal: Old healed right lateral seventh rib fracture. Lower lumbar spine degenerative changes. No aggressive or acute appearing osseous lesions.  IMPRESSION: Enhancing right lower quadrant mass which appears to be immediately adjacent to the terminal ileum is concerning for malignancy, potentially representing carcinoid tumor.  Obstructing 5 mm stone at the left UVJ with resultant moderate left hydroureteronephrosis.  Additionally there is left-greater-than-right perinephric fat stranding, bilateral urothelial thickening and hyper enhancement of the ureters and bladder wall thickening. Overall findings are concerning for cystitis and likely ascending urinary tract infection.  Large amount of stool within the rectum and colon as can be seen with constipation.  Cholelithiasis and sludge within the gallbladder lumen.  Patchy consolidative opacities within the left lung base favored to represent dependent atelectasis. More masslike area of consolidation within the left lower lobe stool favored to represent atelectasis or infectious process however metastatic disease would not be entirely excluded given the abdominal mass.   Electronically Signed   By: Lovey Newcomer M.D.   On:  02/07/2015 23:17     EKG Interpretation None      MDM   Final diagnoses:  Lower abdominal tenderness  Pyelonephritis  RLQ abdominal mass  Hydronephrosis with urinary obstruction due to ureteral calculus   Patient presenting with fevers, nausea, vomiting as well as abdominal tenderness on exam. Patient with temperature of 100.3 mother vitals stable. Patient with mild suprapubic and left lower quadrant abdominal tenderness without evidence of peritonitis. Patient with leukocytosis of 33.2 as well as too numerous to count white cells in urine as well as bacteria and large leukocytes. Patient also with creatinine 1.26 which has doubled from 20 days ago. CT without evidence of 5 mm stone at UVJ causing moderate hydroureter nephrosis, obstruction with evidence  of pyelonephritis. Pt started on Rocephin and culture pending. Patient also with right lower quadrant mass adjacent to terminal ileum concerning for malignancy. Patient without prior history. Consult to hospitalist. Spoke with Dr. Hal Hope who evaluated patient with plan for admission. Spoke with Dr. Baltazar Najjar with urology with plan for stent placement tomorrow morning at 8 AM who requests patient be nothing by mouth.  Discussed all results and patient verbalizes understanding and agrees with plan.  This is a shared patient. This patient was discussed with the physician who saw and evaluated the patient and agrees with the plan.     Al Corpus, PA-C 02/08/15 0101  Sherwood Gambler, MD 02/13/15 [phone]

## 2015-02-07 NOTE — ED Notes (Signed)
Bed: OH20 Expected date:  Expected time:  Means of arrival:  Comments: EMS/n/v/low BP

## 2015-02-08 ENCOUNTER — Inpatient Hospital Stay (HOSPITAL_COMMUNITY): Payer: Medicare HMO | Admitting: Registered Nurse

## 2015-02-08 ENCOUNTER — Encounter (HOSPITAL_COMMUNITY)
Admission: EM | Disposition: A | Discharge status: Nursing Home (Includes ICF) | Payer: Self-pay | Source: Home / Self Care | Attending: Internal Medicine

## 2015-02-08 ENCOUNTER — Encounter (HOSPITAL_COMMUNITY): Payer: Self-pay | Admitting: Internal Medicine

## 2015-02-08 DIAGNOSIS — R197 Diarrhea, unspecified: Secondary | ICD-10-CM | POA: Diagnosis not present

## 2015-02-08 DIAGNOSIS — Z8249 Family history of ischemic heart disease and other diseases of the circulatory system: Secondary | ICD-10-CM | POA: Diagnosis not present

## 2015-02-08 DIAGNOSIS — K219 Gastro-esophageal reflux disease without esophagitis: Secondary | ICD-10-CM | POA: Diagnosis present

## 2015-02-08 DIAGNOSIS — Z833 Family history of diabetes mellitus: Secondary | ICD-10-CM | POA: Diagnosis not present

## 2015-02-08 DIAGNOSIS — N202 Calculus of kidney with calculus of ureter: Secondary | ICD-10-CM | POA: Diagnosis present

## 2015-02-08 DIAGNOSIS — N201 Calculus of ureter: Secondary | ICD-10-CM | POA: Diagnosis not present

## 2015-02-08 DIAGNOSIS — E1165 Type 2 diabetes mellitus with hyperglycemia: Secondary | ICD-10-CM | POA: Diagnosis present

## 2015-02-08 DIAGNOSIS — N179 Acute kidney failure, unspecified: Secondary | ICD-10-CM | POA: Diagnosis present

## 2015-02-08 DIAGNOSIS — A419 Sepsis, unspecified organism: Secondary | ICD-10-CM | POA: Diagnosis present

## 2015-02-08 DIAGNOSIS — F329 Major depressive disorder, single episode, unspecified: Secondary | ICD-10-CM | POA: Diagnosis present

## 2015-02-08 DIAGNOSIS — G40909 Epilepsy, unspecified, not intractable, without status epilepticus: Secondary | ICD-10-CM | POA: Diagnosis present

## 2015-02-08 DIAGNOSIS — I10 Essential (primary) hypertension: Secondary | ICD-10-CM | POA: Diagnosis present

## 2015-02-08 DIAGNOSIS — I959 Hypotension, unspecified: Secondary | ICD-10-CM | POA: Diagnosis present

## 2015-02-08 DIAGNOSIS — B964 Proteus (mirabilis) (morganii) as the cause of diseases classified elsewhere: Secondary | ICD-10-CM | POA: Diagnosis present

## 2015-02-08 DIAGNOSIS — N131 Hydronephrosis with ureteral stricture, not elsewhere classified: Secondary | ICD-10-CM

## 2015-02-08 DIAGNOSIS — Z800 Family history of malignant neoplasm of digestive organs: Secondary | ICD-10-CM | POA: Diagnosis not present

## 2015-02-08 DIAGNOSIS — N12 Tubulo-interstitial nephritis, not specified as acute or chronic: Secondary | ICD-10-CM

## 2015-02-08 DIAGNOSIS — N390 Urinary tract infection, site not specified: Secondary | ICD-10-CM | POA: Diagnosis present

## 2015-02-08 DIAGNOSIS — I5032 Chronic diastolic (congestive) heart failure: Secondary | ICD-10-CM | POA: Diagnosis present

## 2015-02-08 DIAGNOSIS — Z90710 Acquired absence of both cervix and uterus: Secondary | ICD-10-CM | POA: Diagnosis not present

## 2015-02-08 DIAGNOSIS — M545 Low back pain: Secondary | ICD-10-CM | POA: Diagnosis present

## 2015-02-08 DIAGNOSIS — R509 Fever, unspecified: Secondary | ICD-10-CM | POA: Diagnosis present

## 2015-02-08 DIAGNOSIS — E11649 Type 2 diabetes mellitus with hypoglycemia without coma: Secondary | ICD-10-CM | POA: Diagnosis present

## 2015-02-08 DIAGNOSIS — K567 Ileus, unspecified: Secondary | ICD-10-CM | POA: Diagnosis not present

## 2015-02-08 DIAGNOSIS — N39490 Overflow incontinence: Secondary | ICD-10-CM | POA: Diagnosis present

## 2015-02-08 DIAGNOSIS — N136 Pyonephrosis: Secondary | ICD-10-CM | POA: Diagnosis present

## 2015-02-08 DIAGNOSIS — H3500 Unspecified background retinopathy: Secondary | ICD-10-CM | POA: Diagnosis present

## 2015-02-08 DIAGNOSIS — R0902 Hypoxemia: Secondary | ICD-10-CM | POA: Diagnosis present

## 2015-02-08 DIAGNOSIS — N1330 Unspecified hydronephrosis: Secondary | ICD-10-CM

## 2015-02-08 DIAGNOSIS — Z7982 Long term (current) use of aspirin: Secondary | ICD-10-CM | POA: Diagnosis not present

## 2015-02-08 DIAGNOSIS — E876 Hypokalemia: Secondary | ICD-10-CM | POA: Diagnosis present

## 2015-02-08 DIAGNOSIS — Z794 Long term (current) use of insulin: Secondary | ICD-10-CM | POA: Diagnosis not present

## 2015-02-08 DIAGNOSIS — Z8673 Personal history of transient ischemic attack (TIA), and cerebral infarction without residual deficits: Secondary | ICD-10-CM | POA: Diagnosis not present

## 2015-02-08 DIAGNOSIS — E785 Hyperlipidemia, unspecified: Secondary | ICD-10-CM | POA: Diagnosis present

## 2015-02-08 DIAGNOSIS — R4701 Aphasia: Secondary | ICD-10-CM | POA: Diagnosis present

## 2015-02-08 HISTORY — PX: CYSTOSCOPY W/ URETERAL STENT PLACEMENT: SHX1429

## 2015-02-08 LAB — GLUCOSE, CAPILLARY
GLUCOSE-CAPILLARY: 155 mg/dL — AB (ref 65–99)
GLUCOSE-CAPILLARY: 101 mg/dL — AB (ref 65–99)
Glucose-Capillary: 144 mg/dL — ABNORMAL HIGH (ref 65–99)
Glucose-Capillary: 138 mg/dL — ABNORMAL HIGH (ref 65–99)
Glucose-Capillary: 224 mg/dL — ABNORMAL HIGH (ref 65–99)
Glucose-Capillary: 192 mg/dL — ABNORMAL HIGH (ref 65–99)

## 2015-02-08 LAB — COMPREHENSIVE METABOLIC PANEL
ALK PHOS: 87 U/L (ref 38–126)
ALT: 12 U/L — AB (ref 14–54)
AST: 13 U/L — ABNORMAL LOW (ref 15–41)
Albumin: 2.8 g/dL — ABNORMAL LOW (ref 3.5–5.0)
Anion gap: 11 (ref 5–15)
BUN: 24 mg/dL — ABNORMAL HIGH (ref 6–20)
CO2: 27 mmol/L (ref 22–32)
Calcium: 8.2 mg/dL — ABNORMAL LOW (ref 8.9–10.3)
Chloride: 101 mmol/L (ref 101–111)
Creatinine, Ser: 1.04 mg/dL — ABNORMAL HIGH (ref 0.44–1.00)
GFR calc Af Amer: >60 mL/min (ref >60)
GFR, EST NON AFRICAN AMERICAN: 52 mL/min — AB (ref >60)
GLUCOSE: 151 mg/dL — AB (ref 65–99)
POTASSIUM: 3.4 mmol/L — AB (ref 3.5–5.1)
SODIUM: 139 mmol/L (ref 135–145)
Total Bilirubin: 0.3 mg/dL (ref 0.3–1.2)
Total Protein: 6.7 g/dL (ref 6.5–8.1)

## 2015-02-08 LAB — CBC WITH DIFFERENTIAL/PLATELET
BASOS ABS: 0 10*3/uL (ref 0.0–0.1)
Basophils Relative: 0 % (ref 0–1)
EOS ABS: 0 10*3/uL (ref 0.0–0.7)
Eosinophils Relative: 0 % (ref 0–5)
HCT: 37.1 % (ref 36.0–46.0)
Hemoglobin: 11.9 g/dL — ABNORMAL LOW (ref 12.0–15.0)
LYMPHS ABS: 3.2 10*3/uL (ref 0.7–4.0)
Lymphocytes Relative: 11 % — ABNORMAL LOW (ref 12–46)
MCH: 29.2 pg (ref 26.0–34.0)
MCHC: 32.1 g/dL (ref 30.0–36.0)
MCV: 91.2 fL (ref 78.0–100.0)
MONOS PCT: 10 % (ref 3–12)
Monocytes Absolute: 2.9 10*3/uL — ABNORMAL HIGH (ref 0.1–1.0)
NEUTROS ABS: 22.8 10*3/uL — AB (ref 1.7–7.7)
Neutrophils Relative %: 79 % — ABNORMAL HIGH (ref 43–77)
Platelets: 326 10*3/uL (ref 150–400)
RBC: 4.07 MIL/uL (ref 3.87–5.11)
RDW: 14 % (ref 11.5–15.5)
WBC: 28.9 10*3/uL — AB (ref 4.0–10.5)

## 2015-02-08 LAB — PROCALCITONIN: PROCALCITONIN: 3.17 ng/mL

## 2015-02-08 LAB — LACTIC ACID, PLASMA: Lactic Acid, Venous: 1.1 mmol/L (ref 0.5–2.0)

## 2015-02-08 LAB — SURGICAL PCR SCREEN
MRSA, PCR: NEGATIVE
Staphylococcus aureus: NEGATIVE

## 2015-02-08 SURGERY — CYSTOSCOPY, WITH RETROGRADE PYELOGRAM AND URETERAL STENT INSERTION
Anesthesia: General | Laterality: Left

## 2015-02-08 MED ORDER — LACTULOSE 10 GM/15ML PO SOLN
30.0000 g | ORAL | Status: AC
  Administered 2015-02-08: 30 g via ORAL
  Filled 2015-02-08: qty 60

## 2015-02-08 MED ORDER — SORBITOL 70 % SOLN
960.0000 mL | TOPICAL_OIL | Freq: Once | ORAL | Status: AC
  Administered 2015-02-08: 960 mL via RECTAL
  Filled 2015-02-08: qty 240

## 2015-02-08 MED ORDER — ACETAMINOPHEN 10 MG/ML IV SOLN
INTRAVENOUS | Status: AC
  Filled 2015-02-08: qty 100

## 2015-02-08 MED ORDER — LIDOCAINE HCL (CARDIAC) 20 MG/ML IV SOLN
INTRAVENOUS | Status: AC
  Filled 2015-02-08: qty 5

## 2015-02-08 MED ORDER — LIDOCAINE HCL 2 % EX GEL
CUTANEOUS | Status: AC
  Filled 2015-02-08: qty 10

## 2015-02-08 MED ORDER — PROMETHAZINE HCL 25 MG/ML IJ SOLN: 6.2500 mg | INTRAMUSCULAR | Status: DC | PRN

## 2015-02-08 MED ORDER — NEOSTIGMINE METHYLSULFATE 10 MG/10ML IV SOLN
INTRAVENOUS | Status: AC
  Filled 2015-02-08: qty 1

## 2015-02-08 MED ORDER — ONDANSETRON HCL 4 MG/2ML IJ SOLN
INTRAMUSCULAR | Status: AC
  Filled 2015-02-08: qty 2

## 2015-02-08 MED ORDER — MORPHINE SULFATE 2 MG/ML IJ SOLN: 1.0000 mg | INTRAMUSCULAR | Status: DC | PRN

## 2015-02-08 MED ORDER — SODIUM CHLORIDE 0.9 % IR SOLN
Status: DC | PRN
  Administered 2015-02-08: 1000 mL via INTRAVESICAL

## 2015-02-08 MED ORDER — SODIUM CHLORIDE 0.9 % IV SOLN
INTRAVENOUS | Status: AC
  Administered 2015-02-08: 04:00:00 via INTRAVENOUS

## 2015-02-08 MED ORDER — EPHEDRINE SULFATE 50 MG/ML IJ SOLN
INTRAMUSCULAR | Status: AC
  Filled 2015-02-08: qty 1

## 2015-02-08 MED ORDER — GLYCOPYRROLATE 0.2 MG/ML IJ SOLN
INTRAMUSCULAR | Status: AC
  Filled 2015-02-08: qty 1

## 2015-02-08 MED ORDER — INSULIN GLARGINE 100 UNIT/ML ~~LOC~~ SOLN
10.0000 [IU] | Freq: Every day | SUBCUTANEOUS | Status: DC
  Administered 2015-02-08 – 2015-02-12 (×5): 10 [IU] via SUBCUTANEOUS
  Filled 2015-02-08 (×5): qty 0.1

## 2015-02-08 MED ORDER — GLYCOPYRROLATE 0.2 MG/ML IJ SOLN
INTRAMUSCULAR | Status: AC
  Filled 2015-02-08: qty 2

## 2015-02-08 MED ORDER — ACETAMINOPHEN 650 MG RE SUPP: 650.0000 mg | Freq: Four times a day (QID) | RECTAL | Status: DC | PRN

## 2015-02-08 MED ORDER — FENTANYL CITRATE (PF) 100 MCG/2ML IJ SOLN
INTRAMUSCULAR | Status: DC | PRN
  Administered 2015-02-08 (×2): 25 ug via INTRAVENOUS

## 2015-02-08 MED ORDER — FENTANYL CITRATE (PF) 100 MCG/2ML IJ SOLN: 25.0000 ug | INTRAMUSCULAR | Status: DC | PRN

## 2015-02-08 MED ORDER — ARTIFICIAL TEARS OP OINT
TOPICAL_OINTMENT | OPHTHALMIC | Status: AC
Start: 2015-02-08 — End: 2015-02-08
  Filled 2015-02-08: qty 3.5

## 2015-02-08 MED ORDER — IOHEXOL 300 MG/ML  SOLN
INTRAMUSCULAR | Status: DC | PRN
  Administered 2015-02-08: 7 mL

## 2015-02-08 MED ORDER — ONDANSETRON HCL 4 MG PO TABS: 4.0000 mg | ORAL_TABLET | Freq: Four times a day (QID) | ORAL | Status: DC | PRN

## 2015-02-08 MED ORDER — SACCHAROMYCES BOULARDII 250 MG PO CAPS
250.0000 mg | ORAL_CAPSULE | Freq: Two times a day (BID) | ORAL | Status: DC
  Administered 2015-02-08 – 2015-02-13 (×10): 250 mg via ORAL
  Filled 2015-02-08 (×10): qty 1

## 2015-02-08 MED ORDER — SODIUM CHLORIDE 0.9 % IJ SOLN
INTRAMUSCULAR | Status: AC
  Filled 2015-02-08: qty 10

## 2015-02-08 MED ORDER — PROPOFOL 10 MG/ML IV BOLUS
INTRAVENOUS | Status: AC
  Filled 2015-02-08: qty 20

## 2015-02-08 MED ORDER — LIDOCAINE HCL (CARDIAC) 20 MG/ML IV SOLN
INTRAVENOUS | Status: DC | PRN
  Administered 2015-02-08: 50 mg via INTRAVENOUS

## 2015-02-08 MED ORDER — CETYLPYRIDINIUM CHLORIDE 0.05 % MT LIQD
7.0000 mL | Freq: Two times a day (BID) | OROMUCOSAL | Status: DC
  Administered 2015-02-08 – 2015-02-13 (×9): 7 mL via OROMUCOSAL

## 2015-02-08 MED ORDER — BELLADONNA ALKALOIDS-OPIUM 16.2-60 MG RE SUPP
RECTAL | Status: AC
  Filled 2015-02-08: qty 1

## 2015-02-08 MED ORDER — ONDANSETRON HCL 4 MG/2ML IJ SOLN
4.0000 mg | Freq: Four times a day (QID) | INTRAMUSCULAR | Status: DC | PRN
  Administered 2015-02-08: 4 mg via INTRAVENOUS

## 2015-02-08 MED ORDER — PROPOFOL 10 MG/ML IV BOLUS
INTRAVENOUS | Status: DC | PRN
  Administered 2015-02-08: 100 mg via INTRAVENOUS

## 2015-02-08 MED ORDER — LACTATED RINGERS IV SOLN
INTRAVENOUS | Status: DC
  Administered 2015-02-08: 1000 mL via INTRAVENOUS
  Administered 2015-02-08: 12:00:00 via INTRAVENOUS

## 2015-02-08 MED ORDER — ACETAMINOPHEN 10 MG/ML IV SOLN
1000.0000 mg | Freq: Once | INTRAVENOUS | Status: AC
  Administered 2015-02-08: 1000 mg via INTRAVENOUS

## 2015-02-08 MED ORDER — INSULIN ASPART 100 UNIT/ML ~~LOC~~ SOLN
0.0000 [IU] | SUBCUTANEOUS | Status: DC
  Administered 2015-02-08: 2 [IU] via SUBCUTANEOUS
  Administered 2015-02-08: 3 [IU] via SUBCUTANEOUS
  Administered 2015-02-08: 2 [IU] via SUBCUTANEOUS
  Administered 2015-02-09: 5 [IU] via SUBCUTANEOUS
  Administered 2015-02-09: 2 [IU] via SUBCUTANEOUS
  Administered 2015-02-09 (×2): 1 [IU] via SUBCUTANEOUS
  Administered 2015-02-09: 3 [IU] via SUBCUTANEOUS
  Administered 2015-02-09: 2 [IU] via SUBCUTANEOUS
  Administered 2015-02-09: 1 [IU] via SUBCUTANEOUS
  Administered 2015-02-10: 2 [IU] via SUBCUTANEOUS
  Administered 2015-02-10 (×2): 3 [IU] via SUBCUTANEOUS
  Administered 2015-02-10 – 2015-02-11 (×4): 1 [IU] via SUBCUTANEOUS
  Administered 2015-02-11 (×2): 2 [IU] via SUBCUTANEOUS
  Administered 2015-02-11 (×2): 1 [IU] via SUBCUTANEOUS

## 2015-02-08 MED ORDER — FENTANYL CITRATE (PF) 250 MCG/5ML IJ SOLN
INTRAMUSCULAR | Status: AC
  Filled 2015-02-08: qty 5

## 2015-02-08 MED ORDER — SODIUM CHLORIDE 0.9 % IV SOLN
500.0000 mg | Freq: Two times a day (BID) | INTRAVENOUS | Status: DC
  Administered 2015-02-08 – 2015-02-13 (×12): 500 mg via INTRAVENOUS
  Filled 2015-02-08 (×12): qty 5

## 2015-02-08 MED ORDER — ROCURONIUM BROMIDE 100 MG/10ML IV SOLN
INTRAVENOUS | Status: AC
  Filled 2015-02-08: qty 1

## 2015-02-08 MED ORDER — ACETAMINOPHEN 325 MG PO TABS: 650.0000 mg | ORAL_TABLET | Freq: Four times a day (QID) | ORAL | Status: DC | PRN

## 2015-02-08 MED ORDER — PIPERACILLIN-TAZOBACTAM 3.375 G IVPB
3.3750 g | Freq: Three times a day (TID) | INTRAVENOUS | Status: DC
  Administered 2015-02-08 – 2015-02-10 (×6): 3.375 g via INTRAVENOUS
  Filled 2015-02-08 (×8): qty 50

## 2015-02-08 SURGICAL SUPPLY — 16 items
BAG URINE LEG 500ML (DRAIN) ×2 IMPLANT
BAG URO CATCHER STRL LF (DRAPE) ×2 IMPLANT
BASKET ZERO TIP NITINOL 2.4FR (BASKET) IMPLANT
CATH FOLEY 2WAY SLVR  5CC 16FR (CATHETERS) ×1
CATH FOLEY 2WAY SLVR 5CC 16FR (CATHETERS) ×1 IMPLANT
CATH INTERMIT  6FR 70CM (CATHETERS) IMPLANT
CATH URET 5FR 28IN CONE TIP (BALLOONS)
CATH URET 5FR 70CM CONE TIP (BALLOONS) IMPLANT
GLOVE BIOGEL M 8.0 STRL (GLOVE) ×2 IMPLANT
GOWN STRL REUS W/ TWL XL LVL3 (GOWN DISPOSABLE) ×1 IMPLANT
GOWN STRL REUS W/TWL XL LVL3 (GOWN DISPOSABLE) ×3 IMPLANT
MANIFOLD NEPTUNE II (INSTRUMENTS) ×2 IMPLANT
PACK CYSTO (CUSTOM PROCEDURE TRAY) ×2 IMPLANT
STENT URET 6FRX24 CONTOUR (STENTS) ×2 IMPLANT
TUBING CONNECTING 10 (TUBING) IMPLANT
WIRE COONS/BENSON .038X145CM (WIRE) ×2 IMPLANT

## 2015-02-08 NOTE — Progress Notes (Signed)
[facility] PROGRESS NOTE  April [name] LZJ:[id number] DOB: Apr 30, 1941 DOA: 02/07/2015 PCP: Durenda Age, NP  Assessment/Plan: 1. Sepsis with UTI 1. Leukocytosis improving 2. Urine culture pending 3. On empiric zosyn 4. Febrile this afternoon 2. L obstructing distal ureteral stone 1. Urology consulted and pt is s/p stent placement on 6/23 3. Diarrhea/constipation 1. CT abd personally reviewed 2. Large amount to stool seen throughout 3. Pt has responded well to SMOG and lactulose PO 4. Suspect diarrhea secondary to alteration of gut flora from chronic severe constipation 5. Will add florastor 4. Mass around terminal ileum  1. Consulted General Surgery 2. 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 Surgery to follow up as outpatient once pt is no longer septic 5. ARF 1. Renal fx improved 2. Cont to follow 6. DM2 1. On SSI coverage 7. Hx seizures 1. Stable 8. Chronic diastolic CHF 1. Stable 2. Euvolemic 9. DVT prophylaxis 1. SCD's  Code Status: Full Family Communication: Pt in room, niece at bedside (indicate person spoken with, relationship, and if by phone, the number) Disposition Plan: Pending   Consultants:  Urology  General Surgery  Procedures:  Ureteral stent placement 6/23  Antibiotics:  Zosyn 6/22>>>  HPI/Subjective: Feels somewhat better today. Still has decreased appetitie.  Objective: Filed Vitals:   02/08/15 1300 02/08/15 1315 02/08/15 1330 02/08/15 1349  BP: 124/52 121/54 115/66 119/56  Pulse: 89 88 88 87  Temp:   100.4 F (38 C) 99.5 F (37.5 C)  TempSrc:      Resp: 24 22 21 24   Height:      Weight:      SpO2: 92% 93% 95% 94%    Intake/Output Summary (Last 24 hours) at 02/08/15 1913 Last data filed at 02/08/15 1900  Gross per 24 hour  Intake 1076.67 ml  Output    450 ml  Net 626.67 ml   Filed Weights   02/08/15 0110  Weight: 52.8 kg (116 lb 6.5 oz)    Exam:   General:  Awake, in  nad  Cardiovascular: regular, s1, s2  Respiratory: normal resp effort, no wheezing  Abdomen: soft,midly distended  Musculoskeletal: perfused, no clubbing   Data Reviewed: Basic Metabolic Panel:  Recent Labs Lab 02/07/15 2058 02/08/15 0335  NA 140 139  K 3.9 3.4*  CL 101 101  CO2 30 27  GLUCOSE 173* 151*  BUN 23* 24*  CREATININE 1.26* 1.04*  CALCIUM 8.4* 8.2*   Liver Function Tests:  Recent Labs Lab 02/07/15 2058 02/08/15 0335  AST 23 13*  ALT 15 12*  ALKPHOS 85 87  BILITOT 0.8 0.3  PROT 7.1 6.7  ALBUMIN 2.9* 2.8*    Recent Labs Lab 02/07/15 2058  LIPASE 13*   No results for input(s): AMMONIA in the last 168 hours. CBC:  Recent Labs Lab 02/07/15 2058 02/08/15 0335  WBC 33.2* 28.9*  NEUTROABS 26.8* 22.8*  HGB 12.1 11.9*  HCT 37.4 37.1  MCV 89.9 91.2  PLT 383 326   Cardiac Enzymes: No results for input(s): CKTOTAL, CKMB, CKMBINDEX, TROPONINI in the last 168 hours. BNP (last 3 results)  Recent Labs  01/16/15 1119  BNP 144.3*    ProBNP (last 3 results) No results for input(s): PROBNP in the last 8760 hours.  CBG:  Recent Labs Lab 02/08/15 0334 02/08/15 0735 02/08/15 1055 02/08/15 1248 02/08/15 1649  GLUCAP 155* 101* 144* 138* 224*    Recent Results (from the past 240 hour(s))  Surgical pcr screen  Status: None   Collection Time: 02/08/15  4:25 AM  Result Value Ref Range Status   MRSA, PCR NEGATIVE NEGATIVE Final   Staphylococcus aureus NEGATIVE NEGATIVE Final    Comment:        The Xpert SA Assay (FDA approved for NASAL specimens in patients over 47 years of age), is one component of a comprehensive surveillance program.  Test performance has been validated by [facility] for patients greater than or equal to 50 year old. It is not intended to diagnose infection nor to guide or monitor treatment.      Studies: Ct Abdomen Pelvis W Contrast  02/07/2015   CLINICAL DATA:  Patient presents with nausea, vomiting,  diarrhea and hypotension.  EXAM: CT ABDOMEN AND PELVIS WITH CONTRAST  TECHNIQUE: Multidetector CT imaging of the abdomen and pelvis was performed using the standard protocol following bolus administration of intravenous contrast.  CONTRAST:  46mL OMNIPAQUE IOHEXOL 300 MG/ML SOLN, 84mL OMNIPAQUE IOHEXOL 300 MG/ML SOLN  COMPARISON:  None.  FINDINGS: Lower chest: Visualized heart is unremarkable. Patchy opacities within the left lung base. No pleural effusion. Incompletely visualized within the left lower lobe (image 1; series 2) is a more masslike focal area of consolidation.  Hepatobiliary: Liver is normal in size and contour without focal hepatic lesion identified. Small gallstone within the gallbladder lumen. Probable layering sludge within the gallbladder lumen. No gallbladder wall thickening.  Pancreas: Unremarkable  Spleen: Unremarkable  Adrenals/Urinary Tract: Right adrenal myelolipoma. Nonobstructing 3 mm stone interpolar region right kidney. Too small to characterize low-attenuation lesion superior pole right kidney. Left-greater-than-right perinephric fat stranding. There is moderate left hydroureteronephrosis to the level of the urinary bladder were there is an obstructing 5 mm stone. There is circumferential wall thickening of the urinary bladder as well as urothelial hyper-enhancement and thickening of the left and right ureters.  Stomach/Bowel: Large amount of stool within the rectum and colon. No evidence for bowel obstruction. No free fluid or free intraperitoneal air. The stomach is unremarkable.  There is a 1.6 cm enhancing mass immediately adjacent to the terminal ileum which is connected with a right lower quadrant mesenteric mass measuring 3.6 x 2.9 cm (image 46; series 2).  Vascular/Lymphatic: Normal caliber abdominal aorta.  Other: Status post hysterectomy.  Musculoskeletal: Old healed right lateral seventh rib fracture. Lower lumbar spine degenerative changes. No aggressive or acute appearing  osseous lesions.  IMPRESSION: Enhancing right lower quadrant mass which appears to be immediately adjacent to the terminal ileum is concerning for malignancy, potentially representing carcinoid tumor.  Obstructing 5 mm stone at the left UVJ with resultant moderate left hydroureteronephrosis.  Additionally there is left-greater-than-right perinephric fat stranding, bilateral urothelial thickening and hyper enhancement of the ureters and bladder wall thickening. Overall findings are concerning for cystitis and likely ascending urinary tract infection.  Large amount of stool within the rectum and colon as can be seen with constipation.  Cholelithiasis and sludge within the gallbladder lumen.  Patchy consolidative opacities within the left lung base favored to represent dependent atelectasis. More masslike area of consolidation within the left lower lobe stool favored to represent atelectasis or infectious process however metastatic disease would not be entirely excluded given the abdominal mass.   Electronically Signed   By: Lovey Newcomer M.D.   On: 02/07/2015 23:17    Scheduled Meds: . acetaminophen      . antiseptic oral rinse  7 mL Mouth Rinse BID  . insulin aspart  0-9 Units Subcutaneous Q4H  . insulin  glargine  10 Units Subcutaneous QHS  . levETIRAcetam  500 mg Intravenous BID  . piperacillin-tazobactam (ZOSYN)  IV  3.375 g Intravenous 3 times per day  . saccharomyces boulardii  250 mg Oral BID   Continuous Infusions:   Principal Problem:   Sepsis Active Problems:   Seizure disorder   Pyelonephritis   Hydroureteronephrosis   Ureterolithiasis   Diarrhea   ARF (acute renal failure)    April [name] K  [facility] Pager [phone]. If 7PM-7AM, please contact night-coverage at [email or web address], password [facility] 02/08/2015, 7:13 PM  LOS: 0 days

## 2015-02-08 NOTE — Anesthesia Preprocedure Evaluation (Addendum)
Anesthesia Evaluation  Patient identified by MRN, date of birth, ID band Patient awake    Reviewed: Allergy & Precautions, NPO status , Patient's Chart, lab work & pertinent test results  Airway Mallampati: II  TM Distance: >3 FB Neck ROM: Full    Dental no notable dental hx.    Pulmonary neg pulmonary ROS,  breath sounds clear to auscultation  Pulmonary exam normal       Cardiovascular Exercise Tolerance: Good hypertension, Pt. on medications and Pt. on home beta blockers + Peripheral Vascular Disease and +CHF Normal cardiovascular examRhythm:Regular Rate:Normal  H/O CHF although she denies much knowledge of this.  She states her breathing is at baseline today.   Neuro/Psych Seizures -, Well Controlled,  PSYCHIATRIC DISORDERS Depression CVA 10-21-14 with residual weakness especially left side, but she is unable to walk well now. CVA, Residual Symptoms    GI/Hepatic Neg liver ROS, GERD-  Medicated,  Endo/Other  negative endocrine ROSdiabetes  Renal/GU Renal disease  negative genitourinary   Musculoskeletal negative musculoskeletal ROS (+)   Abdominal   Peds negative pediatric ROS (+)  Hematology negative hematology ROS (+)   Anesthesia Other Findings   Reproductive/Obstetrics negative OB ROS                            Anesthesia Physical Anesthesia Plan  ASA: III  Anesthesia Plan: General   Post-op Pain Management:    Induction: Intravenous  Airway Management Planned: LMA  Additional Equipment:   Intra-op Plan:   Post-operative Plan: Extubation in OR  Informed Consent: I have reviewed the patients History and Physical, chart, labs and discussed the procedure including the risks, benefits and alternatives for the proposed anesthesia with the patient or authorized representative who has indicated his/her understanding and acceptance.   Dental advisory given  Plan Discussed  with: CRNA  Anesthesia Plan Comments:         Anesthesia Quick Evaluation

## 2015-02-08 NOTE — H&P (Signed)
[facility] History and Physical  April [name] IRJ:[id number] DOB: [birth year] DOA: 02/07/2015  Referring physician: Ms.Victoria. PCP: MEDINA-VARGAS,MONINA, NP  Specialists: None.  Chief Complaint: Fever chills.  HPI: April [name] is a 74 y.o. female with history of CHF who was admitted 2 weeks ago for acute respiratory failure secondary to CHF presents to the ER because of fever or chills. Patient has been having fever chills over the last 24 hours with left flank pain. Patient also has had multiple episodes of diarrhea. In the ER CT abdomen and pelvis shows left-sided ureteric stone with obstruction and UA showing features consistent with UTI. On-call urologist Dr. Baltazar Najjar was consulted and at this time patient is admitted and urologist as planned and have stent placed for obstructive uropathy with UTI. In addition CT abdomen and pelvis also showing mass around the terminal ileum which will be needing further management. Patient otherwise denies any chest pain or shortness of breath.   Review of Systems: As presented in the history of presenting illness, rest negative.  Past Medical History  Diagnosis Date  . Diabetes mellitus   . Hypertension   . Retinopathy 06/06/2013    Per eye exam 05/31/13 eye exam, Moderate retinopathy   . Aphasia   . Seizures   . Rectum bleeding   . Stroke   . HYPERLIPIDEMIA 10/15/2006    Qualifier: Diagnosis of  By: Beryle Lathe    . GERD (gastroesophageal reflux disease) 05/06/2013  . Gram-positive cocci bacteremia   . Seizure disorder, status epilepticus, nonconvulsive   . Cerebral infarction due to unspecified mechanism   . Uncontrolled type 2 diabetes mellitus with insulin therapy 01/17/2015  . Chronic diastolic CHF (congestive heart failure), grade I 01/18/2015   Past Surgical History  Procedure Laterality Date  . Appendectomy  1972  . Abdominal hysterectomy  1972  . Tee without cardioversion N/A 10/24/2014    Procedure: TRANSESOPHAGEAL  ECHOCARDIOGRAM (TEE);  Surgeon: Dorothy Spark, MD;  Location: [facility];  Service: Cardiovascular;  Laterality: N/A;   Social History:  reports that she has never smoked. She has never used smokeless tobacco. She reports that she does not drink alcohol or use illicit drugs. Where does patient live home. Can patient participate in ADLs? Yes.  Allergies  Allergen Reactions  . Codeine Nausea And Vomiting  . Sulfa Antibiotics Nausea And Vomiting    Family History:  Family History  Problem Relation Age of Onset  . Arthritis Maternal Grandmother   . Heart disease Maternal Grandmother 80  . Diabetes Mother   . Stomach cancer Mother       Prior to Admission medications   Medication Sig Start Date End Date Taking? Authorizing Provider  aspirin EC 325 MG tablet Take 1 tablet (325 mg total) by mouth daily. 10/25/14  Yes Barton Dubois, MD  atorvastatin (LIPITOR) 20 MG tablet Take 1 tablet (20 mg total) by mouth daily at 6 PM. 10/25/14  Yes Barton Dubois, MD  carvedilol (COREG) 3.125 MG tablet Take 1 tablet (3.125 mg total) by mouth 2 (two) times daily with a meal. 01/18/15  Yes Venetia Maxon Rama, MD  furosemide (LASIX) 40 MG tablet Take 1 tablet daily. If you gain > 3lbs in 1 day or 5 lbs in 1 week, take an extra dose. 01/18/15  Yes Venetia Maxon Rama, MD  glimepiride (AMARYL) 2 MG tablet Take 1 tablet (2 mg total) by mouth daily with breakfast. 10/25/14  Yes Barton Dubois, MD  insulin detemir (LEVEMIR) 100 UNIT/ML injection  Inject 0.2 mLs (20 Units total) into the skin daily. 01/18/15  Yes Venetia Maxon Rama, MD  isosorbide dinitrate (ISORDIL) 5 MG tablet Take 1 tablet (5 mg total) by mouth 3 (three) times daily. Take 1 tablet by mouth three times a day 01/18/15  Yes Venetia Maxon Rama, MD  levETIRAcetam (KEPPRA) 500 MG tablet Take 1 tablet (500 mg total) by mouth 2 (two) times daily. 10/25/14  Yes Barton Dubois, MD  omeprazole (PRILOSEC) 20 MG capsule Take 20 mg by mouth daily.   Yes Historical Provider, MD   ondansetron (ZOFRAN ODT) 8 MG disintegrating tablet Take 1 tablet (8 mg total) by mouth every 8 (eight) hours as needed for nausea or vomiting. 10/25/14  Yes Barton Dubois, MD  PROTEIN PO Take 2 scoop by mouth 2 (two) times daily. *Procell Protein Supplement*   Yes Historical Provider, MD  pantoprazole (PROTONIX) 40 MG tablet Take 1 tablet (40 mg total) by mouth daily. Patient not taking: Reported on 02/07/2015 10/25/14   Barton Dubois, MD  potassium chloride SA (K-DUR,KLOR-CON) 20 MEQ tablet Take 2 tablets (40 mEq total) by mouth daily. Patient not taking: Reported on 01/16/2015 10/25/14   Barton Dubois, MD    Physical Exam: Filed Vitals:   02/07/15 2030 02/07/15 2200 02/07/15 2325 02/08/15 0110  BP:  115/69 116/53 120/75  Pulse:  82 87 88  Temp: 100.3 F (37.9 C)   98.6 F (37 C)  TempSrc: Rectal   Oral  Resp:  18 17 20   Height:    4\' 11"  (1.499 m)  Weight:    52.8 kg (116 lb 6.5 oz)  SpO2:  91% 94% 94%     General:  Moderately built and nourished.  Eyes: Anicteric no pallor.  ENT: No discharge from the ears eyes nose and mouth.  Neck: No mass felt.  Cardiovascular: S1 and S2 heard.  Respiratory: No rhonchi or crepitations.  Abdomen: Soft nontender bowel sounds present.  Skin: No rash.  Musculoskeletal: No edema.  Psychiatric: Appears normal.  Neurologic: Alert awake oriented to time place and person. Moves all extremities.  Labs on Admission:  Basic Metabolic Panel:  Recent Labs Lab 02/07/15 2058  NA 140  K 3.9  CL 101  CO2 30  GLUCOSE 173*  BUN 23*  CREATININE 1.26*  CALCIUM 8.4*   Liver Function Tests:  Recent Labs Lab 02/07/15 2058  AST 23  ALT 15  ALKPHOS 85  BILITOT 0.8  PROT 7.1  ALBUMIN 2.9*    Recent Labs Lab 02/07/15 2058  LIPASE 13*   No results for input(s): AMMONIA in the last 168 hours. CBC:  Recent Labs Lab 02/07/15 2058  WBC 33.2*  NEUTROABS 26.8*  HGB 12.1  HCT 37.4  MCV 89.9  PLT 383   Cardiac Enzymes: No  results for input(s): CKTOTAL, CKMB, CKMBINDEX, TROPONINI in the last 168 hours.  BNP (last 3 results)  Recent Labs  01/16/15 1119  BNP 144.3*    ProBNP (last 3 results) No results for input(s): PROBNP in the last 8760 hours.  CBG:  Recent Labs Lab 02/07/15 2031  GLUCAP 169*    Radiological Exams on Admission: Ct Abdomen Pelvis W Contrast  02/07/2015   CLINICAL DATA:  Patient presents with nausea, vomiting, diarrhea and hypotension.  EXAM: CT ABDOMEN AND PELVIS WITH CONTRAST  TECHNIQUE: Multidetector CT imaging of the abdomen and pelvis was performed using the standard protocol following bolus administration of intravenous contrast.  CONTRAST:  51mL OMNIPAQUE IOHEXOL 300 MG/ML SOLN, 82mL  OMNIPAQUE IOHEXOL 300 MG/ML SOLN  COMPARISON:  None.  FINDINGS: Lower chest: Visualized heart is unremarkable. Patchy opacities within the left lung base. No pleural effusion. Incompletely visualized within the left lower lobe (image 1; series 2) is a more masslike focal area of consolidation.  Hepatobiliary: Liver is normal in size and contour without focal hepatic lesion identified. Small gallstone within the gallbladder lumen. Probable layering sludge within the gallbladder lumen. No gallbladder wall thickening.  Pancreas: Unremarkable  Spleen: Unremarkable  Adrenals/Urinary Tract: Right adrenal myelolipoma. Nonobstructing 3 mm stone interpolar region right kidney. Too small to characterize low-attenuation lesion superior pole right kidney. Left-greater-than-right perinephric fat stranding. There is moderate left hydroureteronephrosis to the level of the urinary bladder were there is an obstructing 5 mm stone. There is circumferential wall thickening of the urinary bladder as well as urothelial hyper-enhancement and thickening of the left and right ureters.  Stomach/Bowel: Large amount of stool within the rectum and colon. No evidence for bowel obstruction. No free fluid or free intraperitoneal air. The  stomach is unremarkable.  There is a 1.6 cm enhancing mass immediately adjacent to the terminal ileum which is connected with a right lower quadrant mesenteric mass measuring 3.6 x 2.9 cm (image 46; series 2).  Vascular/Lymphatic: Normal caliber abdominal aorta.  Other: Status post hysterectomy.  Musculoskeletal: Old healed right lateral seventh rib fracture. Lower lumbar spine degenerative changes. No aggressive or acute appearing osseous lesions.  IMPRESSION: Enhancing right lower quadrant mass which appears to be immediately adjacent to the terminal ileum is concerning for malignancy, potentially representing carcinoid tumor.  Obstructing 5 mm stone at the left UVJ with resultant moderate left hydroureteronephrosis.  Additionally there is left-greater-than-right perinephric fat stranding, bilateral urothelial thickening and hyper enhancement of the ureters and bladder wall thickening. Overall findings are concerning for cystitis and likely ascending urinary tract infection.  Large amount of stool within the rectum and colon as can be seen with constipation.  Cholelithiasis and sludge within the gallbladder lumen.  Patchy consolidative opacities within the left lung base favored to represent dependent atelectasis. More masslike area of consolidation within the left lower lobe stool favored to represent atelectasis or infectious process however metastatic disease would not be entirely excluded given the abdominal mass.   Electronically Signed   By: Lovey Newcomer M.D.   On: 02/07/2015 23:17     Assessment/Plan Principal Problem:   Sepsis Active Problems:   Seizure disorder   Pyelonephritis   Hydroureteronephrosis   Ureterolithiasis   Diarrhea   ARF (acute renal failure)   1. Sepsis secondary to UTI with left-sided obstructive uropathy - appreciate urology consult. Patient is kept nothing by mouth in anticipation of stent placement. Patient is on empiric antibiotics for complicated UTI. Follow cultures.  Continue with hydration with close observation of respiratory status given patient's history of CHF. Patient was initially hypotensive on arrival in the ER and was given fluid bolus. 2. Diarrhea - check stool for C. Difficile. 3. Mass around the terminal ileum - will need general surgery consult. 4. Acute renal failure probably from dehydration - gently hydrated and closely follow intake and output and metabolic panel. 5. Diabetes mellitus type 2 - since patient is nothing by mouth I have placed patient on sliding scale coverage. 6. Seizure disorder - since patient is nothing by mouth I have placed patient on IV Keppra. 7. History of CHF and hypertension - initially the patient was hypotensive and was given fluid bolus presently holding off antihypertensives. Closely follow  respiratory status since patient has CHF and is presently receiving IV fluids.   DVT Prophylaxis SCDs in anticipation of procedure.  Code Status: Full code.  Family Communication: Discussed with family at the bedside.  Disposition Plan: Admit to inpatient.    Ghislaine Harcum N. [facility] Pager [phone].  If 7PM-7AM, please contact night-coverage [email or web address] Password [facility] 02/08/2015, 3:14 AM

## 2015-02-08 NOTE — Progress Notes (Signed)
ANTIBIOTIC CONSULT NOTE - INITIAL  Pharmacy Consult for zosyn Indication: intra-abdominal infection, uti  Allergies  Allergen Reactions  . Codeine Nausea And Vomiting  . Sulfa Antibiotics Nausea And Vomiting    Patient Measurements: Height: 4\' 11"  (149.9 cm) Weight: 116 lb 6.5 oz (52.8 kg) IBW/kg (Calculated) : 43.2 Adjusted Body Weight:   Vital Signs: Temp: 98.6 F (37 C) (06/23 0110) Temp Source: Oral (06/23 0110) BP: 120/75 mmHg (06/23 0110) Pulse Rate: 88 (06/23 0110) Intake/Output from previous day:   Intake/Output from this shift:    Labs:  Recent Labs  02/07/15 2058  WBC 33.2*  HGB 12.1  PLT 383  CREATININE 1.26*   Estimated Creatinine Clearance: 29.5 mL/min (by C-G formula based on Cr of 1.26). No results for input(s): VANCOTROUGH, VANCOPEAK, VANCORANDOM, GENTTROUGH, GENTPEAK, GENTRANDOM, TOBRATROUGH, TOBRAPEAK, TOBRARND, AMIKACINPEAK, AMIKACINTROU, AMIKACIN in the last 72 hours.   Microbiology: No results found for this or any previous visit (from the past 720 hour(s)).  Medical History: Past Medical History  Diagnosis Date  . Diabetes mellitus   . Hypertension   . Retinopathy 06/06/2013    Per eye exam 05/31/13 eye exam, Moderate retinopathy   . Aphasia   . Seizures   . Rectum bleeding   . Stroke   . HYPERLIPIDEMIA 10/15/2006    Qualifier: Diagnosis of  By: Beryle Lathe    . GERD (gastroesophageal reflux disease) 05/06/2013  . Gram-positive cocci bacteremia   . Seizure disorder, status epilepticus, nonconvulsive   . Cerebral infarction due to unspecified mechanism   . Uncontrolled type 2 diabetes mellitus with insulin therapy 01/17/2015  . Chronic diastolic CHF (congestive heart failure), grade I 01/18/2015    Medications:  Anti-infectives    Start     Dose/Rate Route Frequency Ordered Stop   02/08/15 0330  piperacillin-tazobactam (ZOSYN) IVPB 3.375 g     3.375 g 12.5 mL/hr over 240 Minutes Intravenous 3 times per day 02/08/15 0319     02/07/15 2315  cefTRIAXone (ROCEPHIN) 1 g in dextrose 5 % 50 mL IVPB     1 g 100 mL/hr over 30 Minutes Intravenous  Once 02/07/15 2308 02/07/15 2351     Assessment: Patient with UTI and ureteral stone.    Goal of Therapy:  Zosyn based on renal function   Plan:  Follow up culture results  Zosyn 3.375g IV Q8H infused over 4hrs.   Tyler Deis, Shea Stakes Crowford 02/08/2015,3:23 AM

## 2015-02-08 NOTE — Anesthesia Postprocedure Evaluation (Signed)
  Anesthesia Post-op Note  Patient: April [name]  Procedure(s) Performed: Procedure(s) (LRB): CYSTOSCOPY WITH RETROGRADE left PYELOGRAM/URETERAL STENT PLACEMENT left insertion of foley (Left)  Patient Location: PACU  Anesthesia Type: General  Level of Consciousness: awake and alert   Airway and Oxygen Therapy: Patient Spontanous Breathing  Post-op Pain: mild  Post-op Assessment: Post-op Vital signs reviewed, Patient's Cardiovascular Status Stable, Respiratory Function Stable, Patent Airway and No signs of Nausea or vomiting  Last Vitals:  Filed Vitals:   02/08/15 1330  BP: 115/66  Pulse: 88  Temp: 38 C  Resp: 21    Post-op Vital Signs: stable   Complications: No apparent anesthesia complications

## 2015-02-08 NOTE — Transfer of Care (Signed)
Immediate Anesthesia Transfer of Care Note  Patient: April [name]  Procedure(s) Performed: Procedure(s): CYSTOSCOPY WITH RETROGRADE left PYELOGRAM/URETERAL STENT PLACEMENT left insertion of foley (Left)  Patient Location: PACU  Anesthesia Type:General  Level of Consciousness: awake, alert  and oriented  Airway & Oxygen Therapy: Patient Spontanous Breathing and Patient connected to face mask oxygen  Post-op Assessment: Report given to RN  Post vital signs: Reviewed and stable  Last Vitals:  Filed Vitals:   02/08/15 1050  BP: 121/58  Pulse: 96  Temp: 38.3 C  Resp: 20    Complications: No apparent anesthesia complications

## 2015-02-08 NOTE — Op Note (Signed)
patient was properly identified and marked in the holding area. April [name] had previously been on IV anabiotic. I discussed the procedure with patient and her niece, Zigmund Daniel. April [name] was then brought to the operating room. General anesthesia was administered. April [name] was placed in the dorsolithotomy position. Genitalia and perineum were prepped and draped. Proper timeout was performed.  A 23 French panendoscope was advanced under her bladder. Bladder was inspected circumferentially. No tumors trabeculations or foreign bodies were noted. Ureteral orifices were normal in configuration and location. The left ureteral orifice was cannulated with a 6 [place] open-ended catheter. A very gentle retrograde was performed using Omnipaque.  This revealed a filling defect approximately 2 cm up the left ureter. There was mild columnization of contrast proximal to this. There is minimal filling of the pyelo-calyceal system, as I did not want to over distend this.   Following this, I negotiated as 0.038 inch sensor-tip guidewire through the open-ended catheter. This easily passed around the stone and up to the upper pole calyces. The open-ended catheter was removed and I passed a 24 cm x 6 French double-J stent over top of this. It was positioned adequately using fluoroscopic and cystoscopic guidance. Good proximal distal curls were seen once a guidewire was removed. The string had been removed from the stent. The bladder was then drained. Because the patient had a large volume within her bladder to beginning of the case, I passed a 16 [place] Foley catheter. This was hooked to dependent drainage. Per the patient tolerated procedure well. April [name]'s awakened and taken to the PACU in stable condition.   April [name]

## 2015-02-08 NOTE — Consult Note (Signed)
  We have been asked to evaluate this patient due to an incidental finding of a mesenteric mass on her abdominal CT scan.  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  The patient presented with sepsis due to an obstructing kidney stone.  She will need to get over her acute infection prior to any type of surgical intervention or follow up.  This has been d/w Dr. Zella Richer.  We will have her follow up with [place] in our office after discharge to discuss further recommendations and options.  Please call us if needed.  Avon Mergenthaler E 4:11 PM 02/08/2015

## 2015-02-08 NOTE — Progress Notes (Signed)
SMOG enema given after CT showed severe constipation, large amount of formed and loose stool returned.

## 2015-02-08 NOTE — Consult Note (Signed)
Urology Consult   Physician requesting consult: Al Corpus  Reason for consult: Left hydroureteronephrosis  History of Present Illness: April [name] is a 74 y.o. with complex medical history including uncontrolled insulin-dependent diabetes, hypertension, depression, CVA, and seizure disorder who presents with fevers and lower back pain.  She was evaluated in the emergency department and initial workup was remarkable for white blood cell count equal to 33. Creatinine elevation to 1.26 from baseline of 0.77. Significant pyuria.  She had had a low-grade temperature of 100.3. Vital signs were otherwise within normal limits and stable.  CT scan was performed which illustrated a left-sided obstructing distal ureteral stone, 5 mm. She is also found to have a new soft tissue lesion in the right lower abdomen concerning for malignancy.  At the time of my interview she was drinking Sprite.  As noted above, she has a complex medical history and was recently admitted to the hospital just over 2 weeks ago for transient alteration of mental status and hypoglycemia. She was also noted to be hypoxic with an O2 saturation of 88% on room air with ambulation. Chest x-ray was concerning for CHF.  She denies a history of urinary tract infections.  No known history of kidney stones.  Past Medical History  Diagnosis Date  . Diabetes mellitus   . Hypertension   . Retinopathy 06/06/2013    Per eye exam 05/31/13 eye exam, Moderate retinopathy   . Aphasia   . Seizures   . Rectum bleeding   . Stroke   . HYPERLIPIDEMIA 10/15/2006    Qualifier: Diagnosis of  By: Beryle Lathe    . GERD (gastroesophageal reflux disease) 05/06/2013  . Gram-positive cocci bacteremia   . Seizure disorder, status epilepticus, nonconvulsive   . Cerebral infarction due to unspecified mechanism   . Uncontrolled type 2 diabetes mellitus with insulin therapy 01/17/2015  . Chronic diastolic CHF (congestive heart failure), grade I  01/18/2015    Past Surgical History  Procedure Laterality Date  . Appendectomy  1972  . Abdominal hysterectomy  1972  . Tee without cardioversion N/A 10/24/2014    Procedure: TRANSESOPHAGEAL ECHOCARDIOGRAM (TEE);  Surgeon: Dorothy Spark, MD;  Location: [facility];  Service: Cardiovascular;  Laterality: N/A;     Current Hospital Medications:  Home meds:    Medication List    ASK your doctor about these medications        aspirin EC 325 MG tablet  Take 1 tablet (325 mg total) by mouth daily.     atorvastatin 20 MG tablet  Commonly known as:  LIPITOR  Take 1 tablet (20 mg total) by mouth daily at 6 PM.     carvedilol 3.125 MG tablet  Commonly known as:  COREG  Take 1 tablet (3.125 mg total) by mouth 2 (two) times daily with a meal.     furosemide 40 MG tablet  Commonly known as:  LASIX  Take 1 tablet daily. If you gain > 3lbs in 1 day or 5 lbs in 1 week, take an extra dose.     glimepiride 2 MG tablet  Commonly known as:  AMARYL  Take 1 tablet (2 mg total) by mouth daily with breakfast.     insulin detemir 100 UNIT/ML injection  Commonly known as:  LEVEMIR  Inject 0.2 mLs (20 Units total) into the skin daily.     isosorbide dinitrate 5 MG tablet  Commonly known as:  ISORDIL  Take 1 tablet (5 mg total) by mouth 3 (three)  times daily. Take 1 tablet by mouth three times a day     levETIRAcetam 500 MG tablet  Commonly known as:  KEPPRA  Take 1 tablet (500 mg total) by mouth 2 (two) times daily.     omeprazole 20 MG capsule  Commonly known as:  PRILOSEC  Take 20 mg by mouth daily.     ondansetron 8 MG disintegrating tablet  Commonly known as:  ZOFRAN ODT  Take 1 tablet (8 mg total) by mouth every 8 (eight) hours as needed for nausea or vomiting.     pantoprazole 40 MG tablet  Commonly known as:  PROTONIX  Take 1 tablet (40 mg total) by mouth daily.     potassium chloride SA 20 MEQ tablet  Commonly known as:  K-DUR,KLOR-CON  Take 2 tablets (40 mEq total) by  mouth daily.     PROTEIN PO  Take 2 scoop by mouth 2 (two) times daily. *Procell Protein Supplement*        Scheduled Meds: Continuous Infusions: PRN Meds:.  Allergies:  Allergies  Allergen Reactions  . Codeine Nausea And Vomiting  . Sulfa Antibiotics Nausea And Vomiting    Family History  Problem Relation Age of Onset  . Arthritis Maternal Grandmother   . Heart disease Maternal Grandmother 80  . Diabetes Mother   . Stomach cancer Mother     Social History:  reports that she has never smoked. She has never used smokeless tobacco. She reports that she does not drink alcohol or use illicit drugs.  ROS: A complete review of systems was performed.  All systems are negative except for pertinent findings as noted.  Physical Exam:  Vital signs in last 24 hours: Temp:  [99.2 F (37.3 C)-100.3 F (37.9 C)] 100.3 F (37.9 C) (06/22 2030) Pulse Rate:  [82-87] 87 (06/22 2325) Resp:  [17-18] 17 (06/22 2325) BP: (115-120)/(53-69) 116/53 mmHg (06/22 2325) SpO2:  [90 %-98 %] 94 % (06/22 2325) Constitutional:  Alert and oriented, No acute distress Cardiovascular: Regular rate and rhythm, No JVD Respiratory: Normal respiratory effort, Lungs clear bilaterally GI: Abdomen is soft, nontender, nondistended, no abdominal masses GU: No CVA tenderness Lymphatic: No lymphadenopathy Neurologic: Grossly intact, no focal deficits Psychiatric: Normal mood and affect  Laboratory Data:   Recent Labs  02/07/15 2058  WBC 33.2*  HGB 12.1  HCT 37.4  PLT 383     Recent Labs  02/07/15 2058  NA 140  K 3.9  CL 101  GLUCOSE 173*  BUN 23*  CALCIUM 8.4*  CREATININE 1.26*     Results for orders placed or performed during the hospital encounter of 02/07/15 (from the past 24 hour(s))  CBG monitoring, ED     Status: Abnormal   Collection Time: 02/07/15  8:31 PM  Result Value Ref Range   Glucose-Capillary 169 (H) 65 - 99 mg/dL   Comment 1 Notify RN    Comment 2 Document in Chart    CBC with Differential     Status: Abnormal   Collection Time: 02/07/15  8:58 PM  Result Value Ref Range   WBC 33.2 (H) 4.0 - 10.5 K/uL   RBC 4.16 3.87 - 5.11 MIL/uL   Hemoglobin 12.1 12.0 - 15.0 g/dL   HCT 37.4 36.0 - 46.0 %   MCV 89.9 78.0 - 100.0 fL   MCH 29.1 26.0 - 34.0 pg   MCHC 32.4 30.0 - 36.0 g/dL   RDW 13.8 11.5 - 15.5 %   Platelets 383 150 - 400  K/uL   Neutrophils Relative % 81 (H) 43 - 77 %   Lymphocytes Relative 8 (L) 12 - 46 %   Monocytes Relative 11 3 - 12 %   Eosinophils Relative 0 0 - 5 %   Basophils Relative 0 0 - 1 %   Neutro Abs 26.8 (H) 1.7 - 7.7 K/uL   Lymphs Abs 2.7 0.7 - 4.0 K/uL   Monocytes Absolute 3.7 (H) 0.1 - 1.0 K/uL   Eosinophils Absolute 0.0 0.0 - 0.7 K/uL   Basophils Absolute 0.0 0.0 - 0.1 K/uL   WBC Morphology WHITE COUNT CONFIRMED ON SMEAR   Comprehensive metabolic panel     Status: Abnormal   Collection Time: 02/07/15  8:58 PM  Result Value Ref Range   Sodium 140 135 - 145 mmol/L   Potassium 3.9 3.5 - 5.1 mmol/L   Chloride 101 101 - 111 mmol/L   CO2 30 22 - 32 mmol/L   Glucose, Bld 173 (H) 65 - 99 mg/dL   BUN 23 (H) 6 - 20 mg/dL   Creatinine, Ser 1.26 (H) 0.44 - 1.00 mg/dL   Calcium 8.4 (L) 8.9 - 10.3 mg/dL   Total Protein 7.1 6.5 - 8.1 g/dL   Albumin 2.9 (L) 3.5 - 5.0 g/dL   AST 23 15 - 41 U/L   ALT 15 14 - 54 U/L   Alkaline Phosphatase 85 38 - 126 U/L   Total Bilirubin 0.8 0.3 - 1.2 mg/dL   GFR calc non Af Amer 41 (L) >60 mL/min   GFR calc Af Amer 48 (L) >60 mL/min   Anion gap 9 5 - 15  Lipase, blood     Status: Abnormal   Collection Time: 02/07/15  8:58 PM  Result Value Ref Range   Lipase 13 (L) 22 - 51 U/L  I-Stat CG4 Lactic Acid, ED     Status: None   Collection Time: 02/07/15  9:05 PM  Result Value Ref Range   Lactic Acid, Venous 1.86 0.5 - 2.0 mmol/L  Urinalysis, Routine w reflex microscopic (not at [facility])     Status: Abnormal   Collection Time: 02/07/15  9:54 PM  Result Value Ref Range   Color, Urine AMBER (A) YELLOW    APPearance TURBID (A) CLEAR   Specific Gravity, Urine 1.015 1.005 - 1.030   pH 7.0 5.0 - 8.0   Glucose, UA NEGATIVE NEGATIVE mg/dL   Hgb urine dipstick MODERATE (A) NEGATIVE   Bilirubin Urine NEGATIVE NEGATIVE   Ketones, ur NEGATIVE NEGATIVE mg/dL   Protein, ur 100 (A) NEGATIVE mg/dL   Urobilinogen, UA 1.0 0.0 - 1.0 mg/dL   Nitrite NEGATIVE NEGATIVE   Leukocytes, UA LARGE (A) NEGATIVE  Urine microscopic-add on     Status: Abnormal   Collection Time: 02/07/15  9:54 PM  Result Value Ref Range   WBC, UA TOO NUMEROUS TO COUNT <3 WBC/hpf   RBC / HPF 7-10 <3 RBC/hpf   Bacteria, UA FEW (A) RARE   Urine-Other AMORPHOUS URATES/PHOSPHATES    No results found for this or any previous visit (from the past 240 hour(s)).  Renal Function:  Recent Labs  02/07/15 2058  CREATININE 1.26*   CrCl cannot be calculated (Unknown ideal weight.).  Radiologic Imaging: Ct Abdomen Pelvis W Contrast  02/07/2015   CLINICAL DATA:  Patient presents with nausea, vomiting, diarrhea and hypotension.  EXAM: CT ABDOMEN AND PELVIS WITH CONTRAST  TECHNIQUE: Multidetector CT imaging of the abdomen and pelvis was performed using the standard protocol following  bolus administration of intravenous contrast.  CONTRAST:  39m OMNIPAQUE IOHEXOL 300 MG/ML SOLN, 822mOMNIPAQUE IOHEXOL 300 MG/ML SOLN  COMPARISON:  None.  FINDINGS: Lower chest: Visualized heart is unremarkable. Patchy opacities within the left lung base. No pleural effusion. Incompletely visualized within the left lower lobe (image 1; series 2) is a more masslike focal area of consolidation.  Hepatobiliary: Liver is normal in size and contour without focal hepatic lesion identified. Small gallstone within the gallbladder lumen. Probable layering sludge within the gallbladder lumen. No gallbladder wall thickening.  Pancreas: Unremarkable  Spleen: Unremarkable  Adrenals/Urinary Tract: Right adrenal myelolipoma. Nonobstructing 3 mm stone interpolar region right kidney.  Too small to characterize low-attenuation lesion superior pole right kidney. Left-greater-than-right perinephric fat stranding. There is moderate left hydroureteronephrosis to the level of the urinary bladder were there is an obstructing 5 mm stone. There is circumferential wall thickening of the urinary bladder as well as urothelial hyper-enhancement and thickening of the left and right ureters.  Stomach/Bowel: Large amount of stool within the rectum and colon. No evidence for bowel obstruction. No free fluid or free intraperitoneal air. The stomach is unremarkable.  There is a 1.6 cm enhancing mass immediately adjacent to the terminal ileum which is connected with a right lower quadrant mesenteric mass measuring 3.6 x 2.9 cm (image 46; series 2).  Vascular/Lymphatic: Normal caliber abdominal aorta.  Other: Status post hysterectomy.  Musculoskeletal: Old healed right lateral seventh rib fracture. Lower lumbar spine degenerative changes. No aggressive or acute appearing osseous lesions.  IMPRESSION: Enhancing right lower quadrant mass which appears to be immediately adjacent to the terminal ileum is concerning for malignancy, potentially representing carcinoid tumor.  Obstructing 5 mm stone at the left UVJ with resultant moderate left hydroureteronephrosis.  Additionally there is left-greater-than-right perinephric fat stranding, bilateral urothelial thickening and hyper enhancement of the ureters and bladder wall thickening. Overall findings are concerning for cystitis and likely ascending urinary tract infection.  Large amount of stool within the rectum and colon as can be seen with constipation.  Cholelithiasis and sludge within the gallbladder lumen.  Patchy consolidative opacities within the left lung base favored to represent dependent atelectasis. More masslike area of consolidation within the left lower lobe stool favored to represent atelectasis or infectious process however metastatic disease would not be  entirely excluded given the abdominal mass.   Electronically Signed   By: DrLovey Newcomer.D.   On: 02/07/2015 23:17    I independently reviewed the above imaging studies.  Impression/Recommendation: [age]ear old woman with multiple medical problems who presents with elevated temperature, urinary tract infection and left-sided distal obstructing ureteral stone. Based on her obstructing stone and urinary tract infection, we recommend placement of a left-sided ureteral stent to decompress the system while her infection is treated with anti-biotics. We discussed doing this now versus 6 hours from now given that she has been drinking Sprite.  Given that she is currently clinically stable we feel the relative risk and benefit favors delaying ureteral stent placement until later this morning. Should she decline clinically, we would change this plan.  Agree with broad-spectrum anabiotic for management of urinary tract infection. Continue nothing by mouth. We will plan for left-sided ureteral stent this morning.  I have met the pt as well as her niece, KaZigmund DanielI have reviewed the history, examined the pt and agree with the assessment and plan as outlined above.

## 2015-02-09 DIAGNOSIS — A419 Sepsis, unspecified organism: Principal | ICD-10-CM

## 2015-02-09 DIAGNOSIS — N390 Urinary tract infection, site not specified: Secondary | ICD-10-CM

## 2015-02-09 LAB — CBC
HCT: 35.7 % — ABNORMAL LOW (ref 36.0–46.0)
Hemoglobin: 11.6 g/dL — ABNORMAL LOW (ref 12.0–15.0)
MCH: 29.7 pg (ref 26.0–34.0)
MCHC: 32.5 g/dL (ref 30.0–36.0)
MCV: 91.3 fL (ref 78.0–100.0)
Platelets: 340 10*3/uL (ref 150–400)
RBC: 3.91 MIL/uL (ref 3.87–5.11)
RDW: 14 % (ref 11.5–15.5)
WBC: 20.2 10*3/uL — ABNORMAL HIGH (ref 4.0–10.5)

## 2015-02-09 LAB — GLUCOSE, CAPILLARY
GLUCOSE-CAPILLARY: 145 mg/dL — AB (ref 65–99)
GLUCOSE-CAPILLARY: 149 mg/dL — AB (ref 65–99)
GLUCOSE-CAPILLARY: 162 mg/dL — AB (ref 65–99)
GLUCOSE-CAPILLARY: 172 mg/dL — AB (ref 65–99)
GLUCOSE-CAPILLARY: 215 mg/dL — AB (ref 65–99)
Glucose-Capillary: 130 mg/dL — ABNORMAL HIGH (ref 65–99)
Glucose-Capillary: 259 mg/dL — ABNORMAL HIGH (ref 65–99)

## 2015-02-09 LAB — BASIC METABOLIC PANEL
Anion gap: 10 (ref 5–15)
BUN: 21 mg/dL — ABNORMAL HIGH (ref 6–20)
CALCIUM: 8.1 mg/dL — AB (ref 8.9–10.3)
CHLORIDE: 105 mmol/L (ref 101–111)
CO2: 28 mmol/L (ref 22–32)
Creatinine, Ser: 0.84 mg/dL (ref 0.44–1.00)
GFR calc Af Amer: >60 mL/min (ref >60)
GFR calc non Af Amer: >60 mL/min (ref >60)
GLUCOSE: 135 mg/dL — AB (ref 65–99)
Potassium: 2.8 mmol/L — ABNORMAL LOW (ref 3.5–5.1)
SODIUM: 143 mmol/L (ref 135–145)

## 2015-02-09 LAB — HEMOGLOBIN AND HEMATOCRIT, BLOOD
HCT: 37.6 % (ref 36.0–46.0)
HCT: 35.5 % — ABNORMAL LOW (ref 36.0–46.0)
Hemoglobin: 12.2 g/dL (ref 12.0–15.0)
Hemoglobin: 11.4 g/dL — ABNORMAL LOW (ref 12.0–15.0)

## 2015-02-09 LAB — MAGNESIUM: Magnesium: 1.9 mg/dL (ref 1.7–2.4)

## 2015-02-09 LAB — CLOSTRIDIUM DIFFICILE BY PCR: CDIFFPCR: NEGATIVE

## 2015-02-09 MED ORDER — POTASSIUM CHLORIDE CRYS ER 20 MEQ PO TBCR
40.0000 meq | EXTENDED_RELEASE_TABLET | Freq: Two times a day (BID) | ORAL | Status: AC
  Administered 2015-02-09 (×2): 40 meq via ORAL
  Filled 2015-02-09 (×2): qty 2

## 2015-02-09 NOTE — Clinical Documentation Improvement (Signed)
Abnormal Lab and/or Testing Results: 6/24:   Potassium: 2.8.   Treatment provided: 6/24:  Potassium chloride 40 meq po.   Possible Clinical Conditions: >  Hypokalemia >  Other >  Not able to determine   Thank you, Russ Halo Documentation Specialist [phone] [email or web address]

## 2015-02-09 NOTE — Evaluation (Signed)
Physical Therapy Evaluation Patient Details Name: April [name] MRN: [id number] DOB: [birth year] Today's Date: 02/09/2015   History of Present Illness  74 yo female admitted for sepsis, UTI now s/p ureteral stent.   Hx of poorly controlled DM, HTN, CVA, Sz, aphasia, residual visual field impairment R eye. Pt is from ALF  Clinical Impression  Pt admitted with above diagnosis. Pt currently with functional limitations due to the deficits listed below (see PT Problem List).  Pt will benefit from skilled PT to increase their independence and safety with mobility to allow discharge to the venue listed below.   Pt able to tolerate short distance ambulation with min assist, typically uses SW and required assist with RW today.  Pt does have w/c at ALF which she uses to get to meals so she feels comfortable transferring to w/c for mobility until feeling stronger.     Follow Up Recommendations Home health PT    Equipment Recommendations  None recommended by PT    Recommendations for Other Services       Precautions / Restrictions Precautions Precautions: Fall Restrictions Weight Bearing Restrictions: No      Mobility  Bed Mobility Overal bed mobility: Needs Assistance Bed Mobility: Supine to Sit     Supine to sit: Supervision Sit to supine: Supervision   General bed mobility comments: for safety  Transfers Overall transfer level: Needs assistance Equipment used: Rolling walker (2 wheeled) Transfers: Sit to/from Stand Sit to Stand: Min guard Stand pivot transfers: Min assist       General transfer comment: close guard for safety  Ambulation/Gait Ambulation/Gait assistance: Min assist Ambulation Distance (Feet): 80 Feet Assistive device: Rolling walker (2 wheeled) Gait Pattern/deviations: Step-through pattern;Ataxic;Decreased stride length     General Gait Details: slow, mildly ataxic gait pattern. Unsteady at times-assist to stabilize occasionally and assist for  maneuvering RW  Stairs            Wheelchair Mobility    Modified Rankin (Stroke Patients Only)       Balance Overall balance assessment: Needs assistance;History of Falls         Standing balance support: Bilateral upper extremity supported;During functional activity Standing balance-Leahy Scale: Poor                               Pertinent Vitals/Pain Pain Assessment: 0-10 Pain Score: 2  Pain Location: adbomen Pain Descriptors / Indicators: Sore Pain Intervention(s): Limited activity within patient's tolerance;Monitored during session;Repositioned    Home Living Family/patient expects to be discharged to:: Assisted living               Home Equipment: Walker - 2 wheels;Wheelchair - manual Additional Comments: they help minimally with showers    Prior Function Level of Independence: Independent with assistive device(s)         Comments: uses walker in room. wheelchair for longer distances.  Able to dress herself     Hand Dominance        Extremity/Trunk Assessment   Upper Extremity Assessment: Overall WFL for tasks assessed           Lower Extremity Assessment: Generalized weakness         Communication   Communication: Other (comment) (h/o expressive difficulties)  Cognition Arousal/Alertness: Awake/alert Behavior During Therapy: WFL for tasks assessed/performed Overall Cognitive Status: History of cognitive impairments - at baseline  General Comments      Exercises        Assessment/Plan    PT Assessment Patient needs continued PT services  PT Diagnosis Difficulty walking;Generalized weakness   PT Problem List Decreased strength;Decreased activity tolerance;Decreased mobility;Decreased knowledge of use of DME;Decreased balance  PT Treatment Interventions DME instruction;Gait training;Functional mobility training;Therapeutic activities;Therapeutic exercise;Patient/family  education;Balance training   PT Goals (Current goals can be found in the Care Plan section) Acute Rehab PT Goals Patient Stated Goal: none stated PT Goal Formulation: With patient Time For Goal Achievement: 02/16/15 Potential to Achieve Goals: Good    Frequency Min 3X/week   Barriers to discharge        Co-evaluation               End of Session Equipment Utilized During Treatment: Gait belt Activity Tolerance: Patient tolerated treatment well Patient left: Other (comment) (in bathroom, aware to use pull cord, RN and tech notified) Nurse Communication: Mobility status         Time: 0350-0938 PT Time Calculation (min) (ACUTE ONLY): 27 min   Charges:   PT Evaluation $Initial PT Evaluation Tier I: 1 Procedure     PT G Codes:        Offie Waide,KATHrine E 02/09/2015, 1:09 PM Carmelia Bake, PT, DPT 02/09/2015 Pager: [phone]

## 2015-02-09 NOTE — Progress Notes (Signed)
CSW continuing to follow.   CSW reviewed chart and noted that PT/OT recommend Home Health PT/OT at ALF.   CSW spoke with [facility] RN[facility] and sent clinical information to facility. [facility] RN[facility] confirmed that pt can return when medically ready for discharge and pt able to return over the weekend if medically ready. CSW notified [facility] RN, [facility] that per urology note that pt will likely discharge with foley in place for a short time until pt more mobile. [facility] RN, [facility] confirmed that ALF can manage pt foley.  CSW updated pt and pt niece, [name] at bedside and provided support.  CSW to continue to follow and assist with pt return to [facility] when medically ready for discharge.  Alison Murray, MSW, [place] [employer] [phone]

## 2015-02-09 NOTE — Evaluation (Signed)
Occupational Therapy Evaluation Patient Details Name: April [name] MRN: [id number] DOB: [birth year] Today's Date: 02/09/2015    History of Present Illness 74 yo female admitted with fever, chills and is s/p urethral stent.   Hx of poorly controlled DM, HTN, CVA, Sz, aphasia, residual visual field impairment R eye. Pt is from ALF   Clinical Impression   Pt was admitted for the above. She was mostly mod I at ALF with basic adls and had assistance for showering.  Pt currently needs min A for ADLs and toilet transfers for balance.  Will follow in acute with supervision level goals.    Follow Up Recommendations  Home health OT ( min assist from ALF for adls/mobility)    Equipment Recommendations  None recommended by OT    Recommendations for Other Services       Precautions / Restrictions Precautions Precautions: Fall Restrictions Weight Bearing Restrictions: No      Mobility Bed Mobility         Supine to sit: Supervision Sit to supine: Supervision   General bed mobility comments: assist for lines  Transfers   Equipment used: None Transfers: Sit to/from [facility] Sit to Stand: Min assist Stand pivot transfers: Min assist       General transfer comment: Therapist in front of pt; used BSC armrests for transfer    Balance                                            ADL Overall ADL's : Needs assistance/impaired     Grooming: Wash/dry hands;Set up;Sitting   Upper Body Bathing: Set up;Sitting   Lower Body Bathing: Minimal assistance;Sit to/from stand   Upper Body Dressing : Minimal assistance;Sitting (iv)   Lower Body Dressing: Minimal assistance;Sit to/from stand   Toilet Transfer: Minimal assistance;Stand-pivot;BSC             General ADL Comments: pt is able to cross legs for ADLS. Min A for adls for balance.  Pt with frequent bowel; diarrhea during OT. States she has had several laxatives.  NT assisted her  with cleaning up     Vision     Perception     Praxis      Pertinent Vitals/Pain Pain Assessment: 0-10 Pain Score: 2  Pain Location: stomach, abdomen Pain Descriptors / Indicators: Sore Pain Intervention(s): Limited activity within patient's tolerance;Monitored during session;Repositioned     Hand Dominance     Extremity/Trunk Assessment Upper Extremity Assessment Upper Extremity Assessment: Overall WFL for tasks assessed           Communication Communication Communication:  (h/o expressive difficulties)   Cognition Arousal/Alertness: Awake/alert Behavior During Therapy: WFL for tasks assessed/performed Overall Cognitive Status: History of cognitive impairments - at baseline                     General Comments       Exercises       Shoulder Instructions      Home Living Family/patient expects to be discharged to:: Assisted living                                 Additional Comments: they help minimally with showers      Prior Functioning/Environment Level of Independence: Independent with assistive device(s)  Comments: uses walker in room. wheelchair for longer distances.  Able to dress herself    OT Diagnosis: Generalized weakness   OT Problem List: Decreased strength;Decreased activity tolerance;Impaired balance (sitting and/or standing);Pain   OT Treatment/Interventions: Self-care/ADL training;DME and/or AE instruction;Patient/family education;Balance training    OT Goals(Current goals can be found in the care plan section) Acute Rehab OT Goals Patient Stated Goal: none stated OT Goal Formulation: With patient Time For Goal Achievement: 02/23/15 Potential to Achieve Goals: Good ADL Goals Pt Will Perform Grooming: with supervision;standing Pt Will Perform Lower Body Bathing: with supervision;sit to/from stand Pt Will Perform Lower Body Dressing: with supervision;sit to/from stand Pt Will Transfer to Toilet: with  supervision;ambulating;bedside commode;regular height toilet  OT Frequency: Min 2X/week   Barriers to D/C:            Co-evaluation              End of Session    Activity Tolerance: Patient limited by fatigue (had just returned from bathroom) Patient left: in bed;with call bell/phone within reach;with family/visitor present;with nursing/sitter in room   Time: 7680-8811 OT Time Calculation (min): 16 min Charges:  OT General Charges $OT Visit: 1 Procedure OT Evaluation $Initial OT Evaluation Tier I: 1 Procedure G-Codes:    Manolo Bosket 03/08/15, 12:14 PM  Lesle Chris, OTR/L [phone] 03/08/2015

## 2015-02-09 NOTE — Clinical Social Work Note (Signed)
Clinical Social Work Assessment  Patient Details  Name: April [name] MRN: [id number] Date of Birth: 22-Sep-1940  Date of referral:  02/09/15               Reason for consult:  Discharge Planning                Permission sought to share information with:  Family Supports Permission granted to share information::  Yes, Verbal Permission Granted  Name::     [name]::  [name]  Relationship::  niece  Contact Information:  [phone]  Housing/Transportation Living arrangements for April past 2 months:  [name] of Information:  Other (Comment Required) (pt niece, [name]) Patient Interpreter Needed:  None Criminal Activity/Legal Involvement Pertinent to Current Situation/Hospitalization:  No - Comment as needed Significant Relationships:  Other(Comment) (Niece) Lives with:  Facility Resident Do you feel safe going back to April place where you live?  Yes Need for family participation in patient care:  Yes (Comment) (pt niece at bedside)  Care giving concerns:  CSW received consult that patient was admitted from [facility] (ph#: [phone]).   Social Worker assessment / plan:  CSW received referral that pt admitted from [name].   CSW met with pt and pt niece, April [name] at bedside. Pt sleeping soundly during time of visit. CSW confirmed with patient niece that pt a resident at [name] and as been residing at facility since April beginning of May. Pt niece reports that pt and pt family have been pleased with [facility] facility. Pt niece states that pt family has had a few care giving concerns at April facility, but plan to meet with April facility regarding those concerns. Pt niece reports that pt family is constantly in an out of [facility] facility to provide support to pt. Pt niece discussed that pt is able to ambulate short distances with a walker and then uses a wheelchair for longer distances  for example getting to April dining area. Pt niece reports that pt has been receiving physical therapy and occupational therapy at [name]. CSW discussed with pt niece that PT/OT plans to evaluate pt this afternoon and inquired with pt niece about rehab at SNF if pt is weak. Pt niece states that pt will not go to a SNF as she has been to [name] in recent past and pt and pt family were not pleased with April SNF stay. Pt niece feels that [name] can continue to meet pt needs and confirmed that pt and pt family plans to return to [name] at discharge with continued home health PT/OT.  CSW contacted [name] and left message with RN, Santa Genera at facility.   CSW completed FL2.   CSW to continue to follow to provide support and assist with pt transition back to [name] when pt medically ready for discharge.   Employment status:  Retired Nurse, adult PT Recommendations:  Not assessed at this time Information / Referral to community resources:  Other (Comment Required) (Referral back to [name])  Patient/Family's Response to care:  Pt sleeping very soundly at this time. Pt niece supportive and actively involved in pt care. Pt niece reports that pt is hopeful to only have to remain in [name] for a few more months to regain her strength and eventually return home. Pt niece expressed that she  encouraging pt to eat and move around as she does not want pt to become increasingly weak.   Patient/Family's Understanding of and Emotional Response to Diagnosis, Current Treatment, and Prognosis:  Pt niece displays good understanding about pt diagnosis and treatment plan. Pt niece expressed there her main concern at [name] is how pt sugars are being managed and pt niece plans to speak to MD about this. Pt niece expressed being pleased with April nursing staff at [name].   Emotional  Assessment Appearance:  Appears stated age Attitude/Demeanor/Rapport:  Unable to Assess (Pt sleeping during time of visit) Affect (typically observed):  Unable to Assess (Pt sleeping during time of visit) Orientation:  Oriented to Self, Oriented to Place, Oriented to  Time, Oriented to Situation Alcohol / Substance use:  Not Applicable Psych involvement (Current and /or in April community):  No (Comment)  Discharge Needs  Concerns to be addressed:  Discharge Planning Concerns Readmission within April last 30 days:  Yes Current discharge risk:  None Barriers to Discharge:  Continued Medical Work up   Ladell Pier, LCSW 02/09/2015, 10:04 AM [phone]

## 2015-02-09 NOTE — Progress Notes (Signed)
[facility] PROGRESS NOTE  [name] OJJ:[id number] DOB: Sep 14, 1940 DOA: 02/07/2015 PCP: Durenda Age, NP  Assessment/Plan: 1. Sepsis with UTI 1. Leukocytosis is improving 2. Urine culture still pending 3. Pt is cont on empiric zosyn 4. Afebrile today 2. L obstructing distal ureteral stone 1. Urology consulted and pt is s/p stent placement on 6/23 2. Ultimate plans for cystoscopy and stone removal once UTI resolved 3. Diarrhea/constipation 1. CT abd personally reviewed 2. Large amount to stool seen throughout 3. Very good results with cathartics 4. Added florastor 5. Pt reports feeling less full today 4. Mass around terminal ileum  1. Consulted General Surgery 2. 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 Surgery to follow up as outpatient once pt is no longer septic 5. ARF 1. Renal fx improved 2. Cont to follow 6. DM2 1. On SSI coverage 7. Hx seizures 1. Stable 8. Chronic diastolic CHF 1. Stable 2. Euvolemic 9. DVT prophylaxis 1. SCD's 10. Hypokalemia 1. replaced  Code Status: Full Family Communication: Pt in room, niece at bedside  Disposition Plan: Pending   Consultants:  Urology  General Surgery  Procedures:  Ureteral stent placement 6/23  Antibiotics:  Zosyn 6/22>>>  HPI/Subjective: Reports feeling better. Still passing multiple loose stools following yesterday's enema  Objective: Filed Vitals:   02/08/15 1330 02/08/15 1349 02/08/15 2122 02/09/15 0417  BP: 115/66 119/56 133/66 139/65  Pulse: 88 87 92 88  Temp: 100.4 F (38 C) 99.5 F (37.5 C) 99.8 F (37.7 C) 99.4 F (37.4 C)  TempSrc:   Oral Oral  Resp: 21 24 20 21   Height:      Weight:      SpO2: 95% 94% 90% 96%    Intake/Output Summary (Last 24 hours) at 02/09/15 1515 Last data filed at 02/09/15 0851  Gross per 24 hour  Intake    480 ml  Output    700 ml  Net   -220 ml   Filed Weights   02/08/15 0110  Weight: 52.8 kg (116 lb 6.5 oz)     Exam:   General:  Awake, laying in bed, in nad  Cardiovascular: regular, s1, s2  Respiratory: normal resp effort, no wheezing  Abdomen: soft,midly distended, pos BS  Musculoskeletal: perfused, no clubbing   Data Reviewed: Basic Metabolic Panel:  Recent Labs Lab 02/07/15 2058 02/08/15 0335 02/09/15 0355  NA 140 139 143  K 3.9 3.4* 2.8*  CL 101 101 105  CO2 30 27 28   GLUCOSE 173* 151* 135*  BUN 23* 24* 21*  CREATININE 1.26* 1.04* 0.84  CALCIUM 8.4* 8.2* 8.1*  MG  --   --  1.9   Liver Function Tests:  Recent Labs Lab 02/07/15 2058 02/08/15 0335  AST 23 13*  ALT 15 12*  ALKPHOS 85 87  BILITOT 0.8 0.3  PROT 7.1 6.7  ALBUMIN 2.9* 2.8*    Recent Labs Lab 02/07/15 2058  LIPASE 13*   No results for input(s): AMMONIA in the last 168 hours. CBC:  Recent Labs Lab 02/07/15 2058 02/08/15 0335 02/09/15 0355  WBC 33.2* 28.9* 20.2*  NEUTROABS 26.8* 22.8*  --   HGB 12.1 11.9* 11.6*  HCT 37.4 37.1 35.7*  MCV 89.9 91.2 91.3  PLT 383 326 340   Cardiac Enzymes: No results for input(s): CKTOTAL, CKMB, CKMBINDEX, TROPONINI in the last 168 hours. BNP (last 3 results)  Recent Labs  01/16/15 1119  BNP 144.3*    ProBNP (last 3 results) No results for input(s): PROBNP  in the last 8760 hours.  CBG:  Recent Labs Lab 02/08/15 2019 02/09/15 0006 02/09/15 0413 02/09/15 0741 02/09/15 1155  GLUCAP 192* 162* 145* 130* 259*    Recent Results (from the past 240 hour(s))  Urine culture     Status: None (Preliminary result)   Collection Time: 02/07/15 10:04 PM  Result Value Ref Range Status   Specimen Description URINE, RANDOM  Final   Special Requests NONE  Final   Culture   Final    CULTURE REINCUBATED FOR BETTER GROWTH Performed at [facility]    Report Status PENDING  Incomplete  Surgical pcr screen     Status: None   Collection Time: 02/08/15  4:25 AM  Result Value Ref Range Status   MRSA, PCR NEGATIVE NEGATIVE Final   Staphylococcus  aureus NEGATIVE NEGATIVE Final    Comment:        The Xpert SA Assay (FDA approved for NASAL specimens in patients over 3 years of age), is one component of a comprehensive surveillance program.  Test performance has been validated by [facility] for patients greater than or equal to 66 year old. It is not intended to diagnose infection nor to guide or monitor treatment.      Studies: Ct Abdomen Pelvis W Contrast  02/07/2015   CLINICAL DATA:  Patient presents with nausea, vomiting, diarrhea and hypotension.  EXAM: CT ABDOMEN AND PELVIS WITH CONTRAST  TECHNIQUE: Multidetector CT imaging of the abdomen and pelvis was performed using the standard protocol following bolus administration of intravenous contrast.  CONTRAST:  66mL OMNIPAQUE IOHEXOL 300 MG/ML SOLN, 27mL OMNIPAQUE IOHEXOL 300 MG/ML SOLN  COMPARISON:  None.  FINDINGS: Lower chest: Visualized heart is unremarkable. Patchy opacities within the left lung base. No pleural effusion. Incompletely visualized within the left lower lobe (image 1; series 2) is a more masslike focal area of consolidation.  Hepatobiliary: Liver is normal in size and contour without focal hepatic lesion identified. Small gallstone within the gallbladder lumen. Probable layering sludge within the gallbladder lumen. No gallbladder wall thickening.  Pancreas: Unremarkable  Spleen: Unremarkable  Adrenals/Urinary Tract: Right adrenal myelolipoma. Nonobstructing 3 mm stone interpolar region right kidney. Too small to characterize low-attenuation lesion superior pole right kidney. Left-greater-than-right perinephric fat stranding. There is moderate left hydroureteronephrosis to the level of the urinary bladder were there is an obstructing 5 mm stone. There is circumferential wall thickening of the urinary bladder as well as urothelial hyper-enhancement and thickening of the left and right ureters.  Stomach/Bowel: Large amount of stool within the rectum and colon. No evidence  for bowel obstruction. No free fluid or free intraperitoneal air. The stomach is unremarkable.  There is a 1.6 cm enhancing mass immediately adjacent to the terminal ileum which is connected with a right lower quadrant mesenteric mass measuring 3.6 x 2.9 cm (image 46; series 2).  Vascular/Lymphatic: Normal caliber abdominal aorta.  Other: Status post hysterectomy.  Musculoskeletal: Old healed right lateral seventh rib fracture. Lower lumbar spine degenerative changes. No aggressive or acute appearing osseous lesions.  IMPRESSION: Enhancing right lower quadrant mass which appears to be immediately adjacent to the terminal ileum is concerning for malignancy, potentially representing carcinoid tumor.  Obstructing 5 mm stone at the left UVJ with resultant moderate left hydroureteronephrosis.  Additionally there is left-greater-than-right perinephric fat stranding, bilateral urothelial thickening and hyper enhancement of the ureters and bladder wall thickening. Overall findings are concerning for cystitis and likely ascending urinary tract infection.  Large amount of stool within  the rectum and colon as can be seen with constipation.  Cholelithiasis and sludge within the gallbladder lumen.  Patchy consolidative opacities within the left lung base favored to represent dependent atelectasis. More masslike area of consolidation within the left lower lobe stool favored to represent atelectasis or infectious process however metastatic disease would not be entirely excluded given the abdominal mass.   Electronically Signed   By: Lovey Newcomer M.D.   On: 02/07/2015 23:17    Scheduled Meds: . antiseptic oral rinse  7 mL Mouth Rinse BID  . insulin aspart  0-9 Units Subcutaneous Q4H  . insulin glargine  10 Units Subcutaneous QHS  . levETIRAcetam  500 mg Intravenous BID  . piperacillin-tazobactam (ZOSYN)  IV  3.375 g Intravenous 3 times per day  . potassium chloride  40 mEq Oral BID  . saccharomyces boulardii  250 mg Oral  BID   Continuous Infusions:   Principal Problem:   Sepsis Active Problems:   Seizure disorder   Pyelonephritis   Hydroureteronephrosis   Ureterolithiasis   Diarrhea   ARF (acute renal failure)    [name] K  [facility] Pager [phone]. If 7PM-7AM, please contact night-coverage at [email or web address], password [facility] 02/09/2015, 3:15 PM  LOS: 1 day

## 2015-02-09 NOTE — Progress Notes (Signed)
1 Day Post-Op Subjective: Patient reports low appetite. No chills. Foley cath in  Objective: Vital signs in last 24 hours: Temp:  [99.4 F (37.4 C)-100.9 F (38.3 C)] 99.4 F (37.4 C) (06/24 0417) Pulse Rate:  [87-97] 88 (06/24 0417) Resp:  [20-24] 21 (06/24 0417) BP: (115-143)/(52-66) 139/65 mmHg (06/24 0417) SpO2:  [90 %-96 %] 96 % (06/24 0417)  Intake/Output from previous day: 06/23 0701 - 06/24 0700 In: 740 [P.O.:240; I.V.:500] Out: 950 [Urine:950] Intake/Output this shift: Total I/O In: -  Out: 500 [Urine:500]  Physical Exam:  Constitutional: Vital signs reviewed. WD WN in NAD   Eyes: PERRL, No scleral icterus.   Pulmonary/Chest: Normal effort Abdominal: Soft. Non-tender, non-distended, bowel sounds are normal, no masses, organomegaly, or guarding present.    Lab Results:  Recent Labs  02/07/15 2058 02/08/15 0335 02/09/15 0355  HGB 12.1 11.9* 11.6*  HCT 37.4 37.1 35.7*   BMET  Recent Labs  02/08/15 0335 02/09/15 0355  NA 139 143  K 3.4* 2.8*  CL 101 105  CO2 27 28  GLUCOSE 151* 135*  BUN 24* 21*  CREATININE 1.04* 0.84  CALCIUM 8.2* 8.1*   No results for input(s): LABPT, INR in the last 72 hours. No results for input(s): LABURIN in the last 72 hours. Results for orders placed or performed during the hospital encounter of 02/07/15  Surgical pcr screen     Status: None   Collection Time: 02/08/15  4:25 AM  Result Value Ref Range Status   MRSA, PCR NEGATIVE NEGATIVE Final   Staphylococcus aureus NEGATIVE NEGATIVE Final    Comment:        The Xpert SA Assay (FDA approved for NASAL specimens in patients over 63 years of age), is one component of a comprehensive surveillance program.  Test performance has been validated by [facility] for patients greater than or equal to 14 year old. It is not intended to diagnose infection nor to guide or monitor treatment.     Studies/Results: Ct Abdomen Pelvis W Contrast  02/07/2015   CLINICAL DATA:   Patient presents with nausea, vomiting, diarrhea and hypotension.  EXAM: CT ABDOMEN AND PELVIS WITH CONTRAST  TECHNIQUE: Multidetector CT imaging of the abdomen and pelvis was performed using the standard protocol following bolus administration of intravenous contrast.  CONTRAST:  93mL OMNIPAQUE IOHEXOL 300 MG/ML SOLN, 11mL OMNIPAQUE IOHEXOL 300 MG/ML SOLN  COMPARISON:  None.  FINDINGS: Lower chest: Visualized heart is unremarkable. Patchy opacities within the left lung base. No pleural effusion. Incompletely visualized within the left lower lobe (image 1; series 2) is a more masslike focal area of consolidation.  Hepatobiliary: Liver is normal in size and contour without focal hepatic lesion identified. Small gallstone within the gallbladder lumen. Probable layering sludge within the gallbladder lumen. No gallbladder wall thickening.  Pancreas: Unremarkable  Spleen: Unremarkable  Adrenals/Urinary Tract: Right adrenal myelolipoma. Nonobstructing 3 mm stone interpolar region right kidney. Too small to characterize low-attenuation lesion superior pole right kidney. Left-greater-than-right perinephric fat stranding. There is moderate left hydroureteronephrosis to the level of the urinary bladder were there is an obstructing 5 mm stone. There is circumferential wall thickening of the urinary bladder as well as urothelial hyper-enhancement and thickening of the left and right ureters.  Stomach/Bowel: Large amount of stool within the rectum and colon. No evidence for bowel obstruction. No free fluid or free intraperitoneal air. The stomach is unremarkable.  There is a 1.6 cm enhancing mass immediately adjacent to the terminal ileum which is  connected with a right lower quadrant mesenteric mass measuring 3.6 x 2.9 cm (image 46; series 2).  Vascular/Lymphatic: Normal caliber abdominal aorta.  Other: Status post hysterectomy.  Musculoskeletal: Old healed right lateral seventh rib fracture. Lower lumbar spine degenerative  changes. No aggressive or acute appearing osseous lesions.  IMPRESSION: Enhancing right lower quadrant mass which appears to be immediately adjacent to the terminal ileum is concerning for malignancy, potentially representing carcinoid tumor.  Obstructing 5 mm stone at the left UVJ with resultant moderate left hydroureteronephrosis.  Additionally there is left-greater-than-right perinephric fat stranding, bilateral urothelial thickening and hyper enhancement of the ureters and bladder wall thickening. Overall findings are concerning for cystitis and likely ascending urinary tract infection.  Large amount of stool within the rectum and colon as can be seen with constipation.  Cholelithiasis and sludge within the gallbladder lumen.  Patchy consolidative opacities within the left lung base favored to represent dependent atelectasis. More masslike area of consolidation within the left lower lobe stool favored to represent atelectasis or infectious process however metastatic disease would not be entirely excluded given the abdominal mass.   Electronically Signed   By: Lovey Newcomer M.D.   On: 02/07/2015 23:17    Assessment/Plan:   Postoperative day 1 cystoscopy and left double-J stent placement to decompress an infected, obstructed left renal unit. She is doing well-white blood count less. She has a catheter-I think she should leave this in until she is up and around better. More than likely, she has over flow incontinence, as she did wear a diaper prior to hospitalization.    We will check on her over the weekend. She will need resolution of her right tract infection prior to cystoscopy and stone extraction. We will arrange follow-up.   LOS: 1 day   Jorja Loa 02/09/2015, 6:35 AM

## 2015-02-10 ENCOUNTER — Inpatient Hospital Stay (HOSPITAL_COMMUNITY): Payer: Medicare HMO

## 2015-02-10 DIAGNOSIS — R197 Diarrhea, unspecified: Secondary | ICD-10-CM

## 2015-02-10 LAB — GLUCOSE, CAPILLARY
GLUCOSE-CAPILLARY: 142 mg/dL — AB (ref 65–99)
GLUCOSE-CAPILLARY: 150 mg/dL — AB (ref 65–99)
Glucose-Capillary: 237 mg/dL — ABNORMAL HIGH (ref 65–99)
Glucose-Capillary: 191 mg/dL — ABNORMAL HIGH (ref 65–99)
Glucose-Capillary: 209 mg/dL — ABNORMAL HIGH (ref 65–99)

## 2015-02-10 LAB — BASIC METABOLIC PANEL
Anion gap: 8 (ref 5–15)
Anion gap: 9 (ref 5–15)
BUN: 14 mg/dL (ref 6–20)
BUN: 12 mg/dL (ref 6–20)
CALCIUM: 8.2 mg/dL — AB (ref 8.9–10.3)
CALCIUM: 8.3 mg/dL — AB (ref 8.9–10.3)
CO2: 28 mmol/L (ref 22–32)
CO2: 26 mmol/L (ref 22–32)
CREATININE: 0.73 mg/dL (ref 0.44–1.00)
Chloride: 102 mmol/L (ref 101–111)
Chloride: 105 mmol/L (ref 101–111)
Creatinine, Ser: 0.58 mg/dL (ref 0.44–1.00)
GFR calc Af Amer: >60 mL/min (ref >60)
GFR calc Af Amer: >60 mL/min (ref >60)
GFR calc non Af Amer: >60 mL/min (ref >60)
Glucose, Bld: 126 mg/dL — ABNORMAL HIGH (ref 65–99)
Glucose, Bld: 225 mg/dL — ABNORMAL HIGH (ref 65–99)
Potassium: 2.9 mmol/L — ABNORMAL LOW (ref 3.5–5.1)
Potassium: 3.7 mmol/L (ref 3.5–5.1)
SODIUM: 140 mmol/L (ref 135–145)
Sodium: 138 mmol/L (ref 135–145)

## 2015-02-10 LAB — MAGNESIUM: Magnesium: 1.8 mg/dL (ref 1.7–2.4)

## 2015-02-10 LAB — CBC
HCT: 37.7 % (ref 36.0–46.0)
Hemoglobin: 11.8 g/dL — ABNORMAL LOW (ref 12.0–15.0)
MCH: 28.4 pg (ref 26.0–34.0)
MCHC: 31.3 g/dL (ref 30.0–36.0)
MCV: 90.6 fL (ref 78.0–100.0)
PLATELETS: 358 10*3/uL (ref 150–400)
RBC: 4.16 MIL/uL (ref 3.87–5.11)
RDW: 13.7 % (ref 11.5–15.5)
WBC: 17.6 10*3/uL — ABNORMAL HIGH (ref 4.0–10.5)

## 2015-02-10 LAB — CEA: CEA: 1.7 ng/mL (ref 0.0–4.7)

## 2015-02-10 LAB — PROCALCITONIN

## 2015-02-10 MED ORDER — POTASSIUM CHLORIDE CRYS ER 20 MEQ PO TBCR
40.0000 meq | EXTENDED_RELEASE_TABLET | ORAL | Status: DC
  Administered 2015-02-10: 40 meq via ORAL
  Filled 2015-02-10: qty 2

## 2015-02-10 MED ORDER — POTASSIUM CHLORIDE CRYS ER 20 MEQ PO TBCR
40.0000 meq | EXTENDED_RELEASE_TABLET | Freq: Once | ORAL | Status: AC
  Administered 2015-02-10: 40 meq via ORAL
  Filled 2015-02-10: qty 2

## 2015-02-10 MED ORDER — LOPERAMIDE HCL 2 MG PO CAPS
2.0000 mg | ORAL_CAPSULE | ORAL | Status: DC | PRN
  Administered 2015-02-10: 2 mg via ORAL
  Filled 2015-02-10: qty 1

## 2015-02-10 MED ORDER — AMOXICILLIN-POT CLAVULANATE 875-125 MG PO TABS
1.0000 | ORAL_TABLET | Freq: Two times a day (BID) | ORAL | Status: DC
  Administered 2015-02-10 – 2015-02-13 (×7): 1 via ORAL
  Filled 2015-02-10 (×7): qty 1

## 2015-02-10 NOTE — Progress Notes (Signed)
[facility] PROGRESS NOTE  April [name] HDQ:[id number] DOB: Apr 04, 1941 DOA: 02/07/2015 PCP: Durenda Age, NP  Assessment/Plan: 1. Sepsis with UTI 1. Leukocytosis is improving 2. Urine cx with Proteus mirabilis 3. Pt was initially cont on empiric zosyn 4. Transition to PO augmentin per Urology recs for total of 10-14 days of abx 2. L obstructing distal ureteral stone 1. Urology consulted and pt is s/p stent placement on 6/23 2. Ultimate plans for cystoscopy and stone removal once UTI resolved 3. Diarrhea/constipation 1. CT abd personally reviewed 2. Large amount to stool seen throughout 3. Very good results with cathartics 4. Added florastor 5. Pt reports feeling less full today 6. cdiff neg 7. F/u abd xray w/ bowel gas. Will add PRN imodium 4. Mass around terminal ileum  1. Consulted General Surgery 2. 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 Surgery to follow up as outpatient once pt is no longer septic 5. ARF 1. Renal fx improved 2. Cont to follow 6. DM2 1. On SSI coverage 7. Hx seizures 1. Stable 8. Chronic diastolic CHF 1. Stable 2. Euvolemic 9. DVT prophylaxis 1. SCD's 10. Hypokalemia 1. replaced  Code Status: Full Family Communication: Pt in room, niece at bedside  Disposition Plan: Pending   Consultants:  Urology  General Surgery  Procedures:  Ureteral stent placement 6/23  Antibiotics:  Zosyn 6/22>>>6/25  Augmentin 6/25>>>  HPI/Subjective: Still having loose stools. Overall feeling better  Objective: Filed Vitals:   02/09/15 0417 02/09/15 1500 02/09/15 2006 02/10/15 0400  BP: 139/65 140/67 148/66 157/70  Pulse: 88 83 87 87  Temp: 99.4 F (37.4 C) 99.2 F (37.3 C) 98.3 F (36.8 C) 99 F (37.2 C)  TempSrc: Oral Oral Oral Oral  Resp: 21 20 20 18   Height:      Weight:    57.1 kg (125 lb 14.1 oz)  SpO2: 96% 94% 93% 92%    Intake/Output Summary (Last 24 hours) at 02/10/15 1501 Last data filed  at 02/10/15 1230  Gross per 24 hour  Intake   1255 ml  Output    700 ml  Net    555 ml   Filed Weights   02/08/15 0110 02/10/15 0400  Weight: 52.8 kg (116 lb 6.5 oz) 57.1 kg (125 lb 14.1 oz)    Exam:   General:  Awake, laying in bed, in nad  Cardiovascular: regular, s1, s2  Respiratory: normal resp effort, no wheezing  Abdomen: soft,nondistended, pos BS  Musculoskeletal: perfused, no clubbing, no cyanosis  Data Reviewed: Basic Metabolic Panel:  Recent Labs Lab 02/07/15 2058 02/08/15 0335 02/09/15 0355 02/10/15 0507 02/10/15 1321  NA 140 139 143 138 140  K 3.9 3.4* 2.8* 2.9* 3.7  CL 101 101 105 102 105  CO2 30 27 28 28 26   GLUCOSE 173* 151* 135* 126* 225*  BUN 23* 24* 21* 14 12  CREATININE 1.26* 1.04* 0.84 0.58 0.73  CALCIUM 8.4* 8.2* 8.1* 8.2* 8.3*  MG  --   --  1.9  --  1.8   Liver Function Tests:  Recent Labs Lab 02/07/15 2058 02/08/15 0335  AST 23 13*  ALT 15 12*  ALKPHOS 85 87  BILITOT 0.8 0.3  PROT 7.1 6.7  ALBUMIN 2.9* 2.8*    Recent Labs Lab 02/07/15 2058  LIPASE 13*   No results for input(s): AMMONIA in the last 168 hours. CBC:  Recent Labs Lab 02/07/15 2058 02/08/15 0335 02/09/15 0355 02/09/15 1950 02/09/15 2310 02/10/15 0507  WBC 33.2* 28.9*  20.2*  --   --  17.6*  NEUTROABS 26.8* 22.8*  --   --   --   --   HGB 12.1 11.9* 11.6* 12.2 11.4* 11.8*  HCT 37.4 37.1 35.7* 37.6 35.5* 37.7  MCV 89.9 91.2 91.3  --   --  90.6  PLT 383 326 340  --   --  358   Cardiac Enzymes: No results for input(s): CKTOTAL, CKMB, CKMBINDEX, TROPONINI in the last 168 hours. BNP (last 3 results)  Recent Labs  01/16/15 1119  BNP 144.3*    ProBNP (last 3 results) No results for input(s): PROBNP in the last 8760 hours.  CBG:  Recent Labs Lab 02/09/15 2008 02/09/15 2307 02/10/15 0400 02/10/15 0751 02/10/15 1204  GLUCAP 149* 215* 142* 150* 237*    Recent Results (from the past 240 hour(s))  Urine culture     Status: None (Preliminary  result)   Collection Time: 02/07/15 10:04 PM  Result Value Ref Range Status   Specimen Description URINE, RANDOM  Final   Special Requests NONE  Final   Culture   Final    >=100,000 COLONIES/mL PROTEUS MIRABILIS Performed at [facility]    Report Status PENDING  Incomplete   Organism ID, Bacteria PROTEUS MIRABILIS  Final      Susceptibility   Proteus mirabilis - MIC*    AMPICILLIN <=2 SENSITIVE Sensitive     CEFAZOLIN <=4 SENSITIVE Sensitive     CEFTRIAXONE <=1 SENSITIVE Sensitive     CIPROFLOXACIN 1 SENSITIVE Sensitive     GENTAMICIN <=1 SENSITIVE Sensitive     IMIPENEM 8 INTERMEDIATE Intermediate     NITROFURANTOIN 128 RESISTANT Resistant     TRIMETH/SULFA <=20 SENSITIVE Sensitive     AMPICILLIN/SULBACTAM <=2 SENSITIVE Sensitive     PIP/TAZO <=4 SENSITIVE Sensitive     * >=100,000 COLONIES/mL PROTEUS MIRABILIS  Surgical pcr screen     Status: None   Collection Time: 02/08/15  4:25 AM  Result Value Ref Range Status   MRSA, PCR NEGATIVE NEGATIVE Final   Staphylococcus aureus NEGATIVE NEGATIVE Final    Comment:        The Xpert SA Assay (FDA approved for NASAL specimens in patients over 88 years of age), is one component of a comprehensive surveillance program.  Test performance has been validated by [facility] for patients greater than or equal to 22 year old. It is not intended to diagnose infection nor to guide or monitor treatment.   Clostridium Difficile by PCR (not at [facility])     Status: None   Collection Time: 02/09/15  4:03 PM  Result Value Ref Range Status   C difficile by pcr NEGATIVE NEGATIVE Final     Studies: Dg Abd Portable 1v  02/10/2015   CLINICAL DATA:  Diffuse abdominal pain, history of left ureteral stent placement  EXAM: PORTABLE ABDOMEN - 1 VIEW  COMPARISON:  02/07/2015  FINDINGS: Scattered large and small bowel gas is noted. A left ureteral stent is noted. The known distal left ureteral stone is noted adjacent to the distal aspect of the  stent. No acute bony abnormality is seen. No free air is noted. A few scattered right renal calculi are noted.  IMPRESSION: Left ureteral stent in satisfactory position. Left ureteral and right renal stones are noted. No acute abnormality seen.   Electronically Signed   By: Inez Catalina M.D.   On: 02/10/2015 12:32    Scheduled Meds: . amoxicillin-clavulanate  1 tablet Oral Q12H  .  antiseptic oral rinse  7 mL Mouth Rinse BID  . insulin aspart  0-9 Units Subcutaneous Q4H  . insulin glargine  10 Units Subcutaneous QHS  . levETIRAcetam  500 mg Intravenous BID  . saccharomyces boulardii  250 mg Oral BID   Continuous Infusions:   Principal Problem:   Sepsis Active Problems:   Seizure disorder   Pyelonephritis   Hydroureteronephrosis   Ureterolithiasis   Diarrhea   ARF (acute renal failure)    Kamauri Kathol K  [facility] Pager [phone]. If 7PM-7AM, please contact night-coverage at [email or web address], password [facility] 02/10/2015, 3:01 PM  LOS: 2 days

## 2015-02-10 NOTE — Progress Notes (Signed)
2 Days Post-Op  Subjective:  1 - Left Ureteral Stone - s/p left ureteral stent placement 6/22 to decompress left kidney in setting of obstructing left ureteral stone and UTI.   2 - Urinary Tract Infection  - Febrile on admission with bacteruria and hydro c/w likely impending urosepsis. UCX 6/22 scant growth (had just been placed on ABX), Now on empiric zosyn.  Today "April [name]" is w/o complaints. She is anxious to go home. She remains afebrile now >24 hrs and urine CX scant growth.   Objective: Vital signs in last 24 hours: Temp:  [98.3 F (36.8 C)-99.2 F (37.3 C)] 99 F (37.2 C) (06/25 0400) Pulse Rate:  [83-87] 87 (06/25 0400) Resp:  [18-20] 18 (06/25 0400) BP: (140-157)/(66-70) 157/70 mmHg (06/25 0400) SpO2:  [92 %-94 %] 92 % (06/25 0400) Weight:  [57.1 kg (125 lb 14.1 oz)] 57.1 kg (125 lb 14.1 oz) (06/25 0400) Last BM Date: 02/09/15  Intake/Output from previous day: 06/24 0701 - 06/25 0700 In: 1150 [P.O.:480; IV Piggyback:670] Out: 400 [Urine:400] Intake/Output this shift:    General appearance: alert, cooperative and family at bedside Head: Normocephalic, without obvious abnormality, atraumatic Neck: supple, symmetrical, trachea midline Back: symmetric, no curvature. ROM normal. No CVA tenderness. Resp: non-labored GI: soft, non-tender; bowel sounds normal; no masses,  no organomegaly Pelvic: external genitalia normal and foley c/d/i with clear yellow urine Extremities: extremities normal, atraumatic, no cyanosis or edema Skin: Skin color, texture, turgor normal. No rashes or lesions Lymph nodes: Cervical, supraclavicular, and axillary nodes normal. Neurologic: Grossly normal  Lab Results:   Recent Labs  02/09/15 0355  02/09/15 2310 02/10/15 0507  WBC 20.2*  --   --  17.6*  HGB 11.6*  < > 11.4* 11.8*  HCT 35.7*  < > 35.5* 37.7  PLT 340  --   --  358  < > = values in this interval not displayed. BMET  Recent Labs  02/09/15 0355 02/10/15 0507  NA 143 138   K 2.8* 2.9*  CL 105 102  CO2 28 28  GLUCOSE 135* 126*  BUN 21* 14  CREATININE 0.84 0.58  CALCIUM 8.1* 8.2*   PT/INR No results for input(s): LABPROT, INR in the last 72 hours. ABG No results for input(s): PHART, HCO3 in the last 72 hours.  Invalid input(s): PCO2, PO2  Studies/Results: No results found.  Anti-infectives: Anti-infectives    Start     Dose/Rate Route Frequency Ordered Stop   02/08/15 0330  piperacillin-tazobactam (ZOSYN) IVPB 3.375 g     3.375 g 12.5 mL/hr over 240 Minutes Intravenous 3 times per day 02/08/15 0319     02/07/15 2315  cefTRIAXone (ROCEPHIN) 1 g in dextrose 5 % 50 mL IVPB     1 g 100 mL/hr over 30 Minutes Intravenous  Once 02/07/15 2308 02/07/15 2351      Assessment/Plan:  1 - Left Ureteral Stone - temporized with left stent. Will need definitive stone management in elective setting when clears infectious parameters.   2 - Urinary Tract Infection  - doing well clinically. OK for DC at anypoint from Urol perspective, may consider 10-14 days total Augmentin or similar given good clinical response to zosyn and no further CX data to drive ABX decision. Dc foley.   3 - We will arrange GU f/u as outpatient.     [facility], April [name] 02/10/2015

## 2015-02-11 ENCOUNTER — Inpatient Hospital Stay (HOSPITAL_COMMUNITY): Payer: Medicare HMO

## 2015-02-11 LAB — CBC
HEMATOCRIT: 37.6 % (ref 36.0–46.0)
HEMOGLOBIN: 12.1 g/dL (ref 12.0–15.0)
MCH: 29.1 pg (ref 26.0–34.0)
MCHC: 32.2 g/dL (ref 30.0–36.0)
MCV: 90.4 fL (ref 78.0–100.0)
Platelets: 346 10*3/uL (ref 150–400)
RBC: 4.16 MIL/uL (ref 3.87–5.11)
RDW: 13.8 % (ref 11.5–15.5)
WBC: 14.7 10*3/uL — ABNORMAL HIGH (ref 4.0–10.5)

## 2015-02-11 LAB — BASIC METABOLIC PANEL
Anion gap: 6 (ref 5–15)
BUN: 11 mg/dL (ref 6–20)
CALCIUM: 8.2 mg/dL — AB (ref 8.9–10.3)
CHLORIDE: 105 mmol/L (ref 101–111)
CO2: 29 mmol/L (ref 22–32)
CREATININE: 0.54 mg/dL (ref 0.44–1.00)
GFR calc non Af Amer: >60 mL/min (ref >60)
Glucose, Bld: 113 mg/dL — ABNORMAL HIGH (ref 65–99)
Potassium: 3.6 mmol/L (ref 3.5–5.1)
SODIUM: 140 mmol/L (ref 135–145)

## 2015-02-11 LAB — GLUCOSE, CAPILLARY
GLUCOSE-CAPILLARY: 122 mg/dL — AB (ref 65–99)
GLUCOSE-CAPILLARY: 106 mg/dL — AB (ref 65–99)
GLUCOSE-CAPILLARY: 188 mg/dL — AB (ref 65–99)
GLUCOSE-CAPILLARY: 145 mg/dL — AB (ref 65–99)
Glucose-Capillary: 142 mg/dL — ABNORMAL HIGH (ref 65–99)
Glucose-Capillary: 148 mg/dL — ABNORMAL HIGH (ref 65–99)
Glucose-Capillary: 174 mg/dL — ABNORMAL HIGH (ref 65–99)

## 2015-02-11 MED ORDER — BISACODYL 10 MG RE SUPP
10.0000 mg | Freq: Once | RECTAL | Status: AC
  Administered 2015-02-11: 10 mg via RECTAL
  Filled 2015-02-11: qty 1

## 2015-02-11 NOTE — Progress Notes (Signed)
[facility] PROGRESS NOTE  April [name] XNA:[id number] DOB: Jan 26, 1941 DOA: 02/07/2015 PCP: Durenda Age, NP  Assessment/Plan: 1. Sepsis with UTI 1. Leukocytosis resolved 2. Sepsis resolved 3. Urine cx with Proteus mirabilis 4. Pt was initially continued on empiric zosyn 5. Transitioned to PO augmentin per Urology recs for total of 10-14 days of abx 2. L obstructing distal ureteral stone 1. Urology was consulted and pt is s/p stent placement on 6/23 2. Ultimate plans for cystoscopy and stone removal as outpatient once UTI resolved 3. Diarrhea/constipation 1. CT abd personally reviewed 2. Large amount to stool seen throughout 3. Very good results with cathartics 4. Added florastor 5. cdiff neg 6. F/u abd xray w/ bowel gas, stool in ascending colon and ileus 7. Will give trial of dulcolax suppository 4. Mass around terminal ileum  1. Consulted General Surgery 2. 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 Surgery to follow up as outpatient once pt is no longer septic 5. ARF 1. Renal fx has improved 2. Cont to follow 6. DM2 1. On SSI coverage 7. Hx seizures 1. Stable 8. Chronic diastolic CHF 1. Stable 2. Euvolemic 9. DVT prophylaxis 1. SCD's 10. Hypokalemia 1. Replaced and normalized  Code Status: Full Family Communication: Pt in room, niece at bedside  Disposition Plan: Pending ALF with PT/OT   Consultants:  Urology  General Surgery  Procedures:  Ureteral stent placement 6/23  Antibiotics:  Zosyn 6/22>>>6/25  Augmentin 6/25>>>  HPI/Subjective: States abd feels more "full." Not passing gas this AM  Objective: Filed Vitals:   02/10/15 0400 02/10/15 1500 02/10/15 2106 02/11/15 1452  BP: 157/70 144/62 148/74 170/77  Pulse: 87 86 85 84  Temp: 99 F (37.2 C) 98.3 F (36.8 C) 98.9 F (37.2 C) 98.9 F (37.2 C)  TempSrc: Oral Oral Oral Oral  Resp: 18 20 18 20   Height:      Weight: 57.1 kg (125 lb 14.1 oz)  52.4 kg  (115 lb 8.3 oz)   SpO2: 92% 94% 91% 94%    Intake/Output Summary (Last 24 hours) at 02/11/15 1551 Last data filed at 02/11/15 1300  Gross per 24 hour  Intake   1320 ml  Output    350 ml  Net    970 ml   Filed Weights   02/08/15 0110 02/10/15 0400 02/10/15 2106  Weight: 52.8 kg (116 lb 6.5 oz) 57.1 kg (125 lb 14.1 oz) 52.4 kg (115 lb 8.3 oz)    Exam:   General:  Awake, laying in bed, in nad  Cardiovascular: regular, s1, s2  Respiratory: normal resp effort, no wheezing  Abdomen: soft,more distended, decreased BS  Musculoskeletal: perfused, no clubbing, no cyanosis  Data Reviewed: Basic Metabolic Panel:  Recent Labs Lab 02/08/15 0335 02/09/15 0355 02/10/15 0507 02/10/15 1321 02/11/15 0445  NA 139 143 138 140 140  K 3.4* 2.8* 2.9* 3.7 3.6  CL 101 105 102 105 105  CO2 27 28 28 26 29   GLUCOSE 151* 135* 126* 225* 113*  BUN 24* 21* 14 12 11   CREATININE 1.04* 0.84 0.58 0.73 0.54  CALCIUM 8.2* 8.1* 8.2* 8.3* 8.2*  MG  --  1.9  --  1.8  --    Liver Function Tests:  Recent Labs Lab 02/07/15 2058 02/08/15 0335  AST 23 13*  ALT 15 12*  ALKPHOS 85 87  BILITOT 0.8 0.3  PROT 7.1 6.7  ALBUMIN 2.9* 2.8*    Recent Labs Lab 02/07/15 2058  LIPASE 13*   No  results for input(s): AMMONIA in the last 168 hours. CBC:  Recent Labs Lab 02/07/15 2058 02/08/15 0335 02/09/15 0355 02/09/15 1950 02/09/15 2310 02/10/15 0507 02/11/15 0445  WBC 33.2* 28.9* 20.2*  --   --  17.6* 14.7*  NEUTROABS 26.8* 22.8*  --   --   --   --   --   HGB 12.1 11.9* 11.6* 12.2 11.4* 11.8* 12.1  HCT 37.4 37.1 35.7* 37.6 35.5* 37.7 37.6  MCV 89.9 91.2 91.3  --   --  90.6 90.4  PLT 383 326 340  --   --  358 346   Cardiac Enzymes: No results for input(s): CKTOTAL, CKMB, CKMBINDEX, TROPONINI in the last 168 hours. BNP (last 3 results)  Recent Labs  01/16/15 1119  BNP 144.3*    ProBNP (last 3 results) No results for input(s): PROBNP in the last 8760 hours.  CBG:  Recent  Labs Lab 02/10/15 2058 02/11/15 0003 02/11/15 0545 02/11/15 0737 02/11/15 1240  GLUCAP 209* 142* 122* 106* 188*    Recent Results (from the past 240 hour(s))  Urine culture     Status: None (Preliminary result)   Collection Time: 02/07/15 10:04 PM  Result Value Ref Range Status   Specimen Description URINE, RANDOM  Final   Special Requests NONE  Final   Culture   Final    >=100,000 COLONIES/mL PROTEUS MIRABILIS Performed at [facility]    Report Status PENDING  Incomplete   Organism ID, Bacteria PROTEUS MIRABILIS  Final      Susceptibility   Proteus mirabilis - MIC*    AMPICILLIN <=2 SENSITIVE Sensitive     CEFAZOLIN <=4 SENSITIVE Sensitive     CEFTRIAXONE <=1 SENSITIVE Sensitive     CIPROFLOXACIN 1 SENSITIVE Sensitive     GENTAMICIN <=1 SENSITIVE Sensitive     IMIPENEM 8 INTERMEDIATE Intermediate     NITROFURANTOIN 128 RESISTANT Resistant     TRIMETH/SULFA <=20 SENSITIVE Sensitive     AMPICILLIN/SULBACTAM <=2 SENSITIVE Sensitive     PIP/TAZO <=4 SENSITIVE Sensitive     * >=100,000 COLONIES/mL PROTEUS MIRABILIS  Surgical pcr screen     Status: None   Collection Time: 02/08/15  4:25 AM  Result Value Ref Range Status   MRSA, PCR NEGATIVE NEGATIVE Final   Staphylococcus aureus NEGATIVE NEGATIVE Final    Comment:        The Xpert SA Assay (FDA approved for NASAL specimens in patients over 39 years of age), is one component of a comprehensive surveillance program.  Test performance has been validated by [facility] for patients greater than or equal to 50 year old. It is not intended to diagnose infection nor to guide or monitor treatment.   Clostridium Difficile by PCR (not at [facility])     Status: None   Collection Time: 02/09/15  4:03 PM  Result Value Ref Range Status   C difficile by pcr NEGATIVE NEGATIVE Final     Studies: Dg Abd Portable 1v  02/11/2015   CLINICAL DATA:  Improved condition.  Abdominal distention.  EXAM: PORTABLE ABDOMEN - 1 VIEW   COMPARISON:  02/10/2015  FINDINGS: Left double-J ureteral stent unchanged in position. There is mild gaseous distention of both large and small bowel loops radiographically stable since prior study. There is moderate stool within the ascending colon. No evidence for free intraperitoneal air.  IMPRESSION: Persistent distention of bowel loops most consistent with ileus.  Stable appearance of left ureteral stent.   Electronically Signed   By:  Nolon Nations M.D.   On: 02/11/2015 13:05   Dg Abd Portable 1v  02/10/2015   CLINICAL DATA:  Diffuse abdominal pain, history of left ureteral stent placement  EXAM: PORTABLE ABDOMEN - 1 VIEW  COMPARISON:  02/07/2015  FINDINGS: Scattered large and small bowel gas is noted. A left ureteral stent is noted. The known distal left ureteral stone is noted adjacent to the distal aspect of the stent. No acute bony abnormality is seen. No free air is noted. A few scattered right renal calculi are noted.  IMPRESSION: Left ureteral stent in satisfactory position. Left ureteral and right renal stones are noted. No acute abnormality seen.   Electronically Signed   By: Inez Catalina M.D.   On: 02/10/2015 12:32    Scheduled Meds: . amoxicillin-clavulanate  1 tablet Oral Q12H  . antiseptic oral rinse  7 mL Mouth Rinse BID  . insulin aspart  0-9 Units Subcutaneous Q4H  . insulin glargine  10 Units Subcutaneous QHS  . levETIRAcetam  500 mg Intravenous BID  . saccharomyces boulardii  250 mg Oral BID   Continuous Infusions:   Principal Problem:   Sepsis Active Problems:   Seizure disorder   Pyelonephritis   Hydroureteronephrosis   Ureterolithiasis   Diarrhea   ARF (acute renal failure)    Morna Flud K  [facility] Pager [phone]. If 7PM-7AM, please contact night-coverage at [email or web address], password [facility] 02/11/2015, 3:51 PM  LOS: 3 days

## 2015-02-11 NOTE — Progress Notes (Signed)
Occupational Therapy Treatment Patient Details Name: April [name] MRN: [id number] DOB: 1941/07/31 Today's Date: 02/11/2015    History of present illness 74 yo female admitted for sepsis, UTI now s/p ureteral stent.   Hx of poorly controlled DM, HTN, CVA, Sz, aphasia, residual visual field impairment R eye. Pt is from ALF   OT comments  Pt appropriate to return to ALF  Follow Up Recommendations  Home health OT;Other (comment) (at ALF)    Equipment Recommendations  None recommended by OT    Recommendations for Other Services      Precautions / Restrictions Precautions Precautions: Fall Restrictions Weight Bearing Restrictions: No       Mobility Bed Mobility               General bed mobility comments: pt in chair  Transfers Overall transfer level: Needs assistance Equipment used: 1 person hand held assist;Rolling walker (2 wheeled) Transfers: Sit to/from Stand Sit to Stand: Min assist Stand pivot transfers: Min assist            Balance Overall balance assessment: History of Falls;Needs assistance           Standing balance-Leahy Scale: Good                     ADL Overall ADL's : Needs assistance/impaired                     Lower Body Dressing: Sit to/from stand;Minimal assistance       Toileting- Clothing Manipulation and Hygiene: Minimal assistance         General ADL Comments: Pt stood for clothing management and urinated on floor.  OT had given pt mesh panties and 2 pads which were both soaked.  Niece then offered a depends she had in her purse.  Pt stood for approx 10 min during session.      Vision                     Perception     Praxis      Cognition   Behavior During Therapy: WFL for tasks assessed/performed Overall Cognitive Status: History of cognitive impairments - at baseline                       Extremity/Trunk Assessment               Exercises     Shoulder  Instructions       General Comments      Pertinent Vitals/ Pain       Pain Assessment: No/denies pain  Home Living                                          Prior Functioning/Environment              Frequency       Progress Toward Goals  OT Goals(current goals can now be found in the care plan section)  Progress towards OT goals: Progressing toward goals     Plan Discharge plan remains appropriate    Co-evaluation                 End of Session     Activity Tolerance Patient tolerated treatment well   Patient Left in chair;with nursing/sitter in room;with family/visitor present   Nurse Communication  Time: 3494-9447 OT Time Calculation (min): 31 min  Charges: OT General Charges $OT Visit: 1 Procedure OT Treatments $Self Care/Home Management : 23-37 mins  Keeyon Privitera, Thereasa Parkin 02/11/2015, 12:19 PM

## 2015-02-11 NOTE — Progress Notes (Signed)
Patient's blood pressure elevated, Dr. Wyline Copas notified, no new order given. Will continue to assess patient.

## 2015-02-12 ENCOUNTER — Encounter (HOSPITAL_COMMUNITY): Payer: Self-pay | Admitting: Urology

## 2015-02-12 LAB — GLUCOSE, CAPILLARY
GLUCOSE-CAPILLARY: 106 mg/dL — AB (ref 65–99)
GLUCOSE-CAPILLARY: 122 mg/dL — AB (ref 65–99)
Glucose-Capillary: 120 mg/dL — ABNORMAL HIGH (ref 65–99)
Glucose-Capillary: 221 mg/dL — ABNORMAL HIGH (ref 65–99)
Glucose-Capillary: 174 mg/dL — ABNORMAL HIGH (ref 65–99)

## 2015-02-12 LAB — BASIC METABOLIC PANEL
ANION GAP: 10 (ref 5–15)
BUN: 11 mg/dL (ref 6–20)
CO2: 29 mmol/L (ref 22–32)
Calcium: 8.6 mg/dL — ABNORMAL LOW (ref 8.9–10.3)
Chloride: 102 mmol/L (ref 101–111)
Creatinine, Ser: 0.59 mg/dL (ref 0.44–1.00)
GLUCOSE: 111 mg/dL — AB (ref 65–99)
Potassium: 3.3 mmol/L — ABNORMAL LOW (ref 3.5–5.1)
SODIUM: 141 mmol/L (ref 135–145)

## 2015-02-12 LAB — PROCALCITONIN: PROCALCITONIN: 0.43 ng/mL

## 2015-02-12 LAB — CBC
HEMATOCRIT: 38.8 % (ref 36.0–46.0)
Hemoglobin: 12.4 g/dL (ref 12.0–15.0)
MCH: 28.9 pg (ref 26.0–34.0)
MCHC: 32 g/dL (ref 30.0–36.0)
MCV: 90.4 fL (ref 78.0–100.0)
Platelets: 360 10*3/uL (ref 150–400)
RBC: 4.29 MIL/uL (ref 3.87–5.11)
RDW: 13.7 % (ref 11.5–15.5)
WBC: 14.2 10*3/uL — ABNORMAL HIGH (ref 4.0–10.5)

## 2015-02-12 MED ORDER — BISACODYL 10 MG RE SUPP
10.0000 mg | Freq: Every day | RECTAL | Status: DC | PRN
  Administered 2015-02-12: 10 mg via RECTAL
  Filled 2015-02-12: qty 1

## 2015-02-12 MED ORDER — INSULIN ASPART 100 UNIT/ML ~~LOC~~ SOLN
0.0000 [IU] | Freq: Three times a day (TID) | SUBCUTANEOUS | Status: DC
  Administered 2015-02-12: 2 [IU] via SUBCUTANEOUS
  Administered 2015-02-12: 5 [IU] via SUBCUTANEOUS
  Administered 2015-02-13: 2 [IU] via SUBCUTANEOUS

## 2015-02-12 MED ORDER — INSULIN ASPART 100 UNIT/ML ~~LOC~~ SOLN: 0.0000 [IU] | Freq: Every day | SUBCUTANEOUS | Status: DC

## 2015-02-12 NOTE — Progress Notes (Signed)
Physical Therapy Treatment Patient Details Name: April [name] MRN: [id number] DOB: Sep 29, 1940 Today's Date: 2015-03-13    History of Present Illness 74 yo female admitted for sepsis, UTI now s/p ureteral stent.   Hx of poorly controlled DM, HTN, CVA, Sz, aphasia, residual visual field impairment R eye. Pt is from ALF    PT Comments    Pt assisted with transfers.  Unable to ambulate today due to incontinence (even with mesh garment and pad), however pt performed multiple sit to stands for hygiene.  Pt hopeful to d/c back to ALF today.  Follow Up Recommendations  Home health PT     Equipment Recommendations  None recommended by PT    Recommendations for Other Services       Precautions / Restrictions Precautions Precautions: Fall Restrictions Weight Bearing Restrictions: No    Mobility  Bed Mobility Overal bed mobility: Needs Assistance Bed Mobility: Supine to Sit     Supine to sit: Supervision     General bed mobility comments: for safety  Transfers Overall transfer level: Needs assistance Equipment used: Rolling walker (2 wheeled) Transfers: Sit to/from [employer] Transfers Sit to Stand: Min assist Stand pivot transfers: Min assist       General transfer comment: assist to rise and steady, pt with urinary incontinence upon standing so pivoted to [facility], upon attempting to ambulate again pt then incontinent of loose gel-like BM, assisted pt with hygiene then switched out [facility] for recliner  Ambulation/Gait                 Stairs            Wheelchair Mobility    Modified Rankin (Stroke Patients Only)       Balance                                    Cognition Arousal/Alertness: Awake/alert Behavior During Therapy: WFL for tasks assessed/performed Overall Cognitive Status: History of cognitive impairments - at baseline                      Exercises      General Comments        Pertinent Vitals/Pain  Pain Assessment: No/denies pain    Home Living                      Prior Function            PT Goals (current goals can now be found in the care plan section) Progress towards PT goals: Progressing toward goals    Frequency  Min 3X/week    PT Plan Current plan remains appropriate    Co-evaluation             End of Session   Activity Tolerance: Patient tolerated treatment well Patient left: in chair;with call bell/phone within reach;with chair alarm set;with nursing/sitter in room     Time: 6803-2122 PT Time Calculation (min) (ACUTE ONLY): 25 min  Charges:  $Therapeutic Activity: 8-22 mins                    G Codes:      Blia Totman,KATHrine E 2015-03-13, 10:25 AM Carmelia Bake, PT, DPT 13-Mar-2015 Pager: [phone]

## 2015-02-12 NOTE — Care Management (Signed)
IM LETTER GIVEN TO PATIENT  

## 2015-02-12 NOTE — Progress Notes (Signed)
[facility] PROGRESS NOTE  April [name] CZY:[id number] DOB: 06-13-1941 DOA: 02/07/2015 PCP: Durenda Age, NP  Assessment/Plan: 1. Sepsis with UTI 1. Leukocytosis improving 2. Sepsis resolved 3. Urine cx with Proteus mirabilis 4. Pt was initially continued on empiric zosyn 5. Transitioned to PO augmentin per Urology recs for total of 10-14 days of abx 2. L obstructing distal ureteral stone 1. Urology was consulted and pt is s/p stent placement on 6/23 2. Ultimate plans for cystoscopy and stone removal as outpatient once UTI is fully resolved 3. Diarrhea/constipation 1. Recent CT abd on admit was personally reviewed 2. Large amount to stool seen throughout 3. Very good results with cathartics 4. Added florastor 5. cdiff neg 6. F/u abd xray w/ bowel gas, stool in ascending colon and ileus 7. Pt seems improved with dulolax suppository, still distended with decreased BS this AM. Will resume dulcolax 4. Mass around terminal ileum  1. Earlier consulted General Surgery 2. 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 Surgery to follow up as outpatient once pt is no longer septic 5. ARF 1. Renal fx has improved 2. Cont to follow 6. DM2 1. On SSI coverage 7. Hx seizures 1. Stable 8. Chronic diastolic CHF 1. Stable 2. Euvolemic 9. DVT prophylaxis 1. SCD's 10. Hypokalemia 1. Replaced and normalized  Code Status: Full Family Communication: Pt in room, niece at bedside  Disposition Plan: Pending ALF with PT/OT when ileus resolves   Consultants:  Urology  General Surgery  Procedures:  Ureteral stent placement 6/23  Antibiotics:  Zosyn 6/22>>>6/25  Augmentin 6/25>>>  HPI/Subjective: Reports passing flatus early this AM, but none since. States abd still feels full  Objective: Filed Vitals:   02/12/15 0350 02/12/15 0400 02/12/15 0421 02/12/15 1500  BP: 162/81 150/78  158/80  Pulse: 81   77  Temp: 98.4 F (36.9 C)   98.5 F (36.9  C)  TempSrc: Oral   Oral  Resp: 20   18  Height:      Weight:   52.9 kg (116 lb 10 oz)   SpO2: 95%   95%    Intake/Output Summary (Last 24 hours) at 02/12/15 1700 Last data filed at 02/12/15 1300  Gross per 24 hour  Intake    810 ml  Output    300 ml  Net    510 ml   Filed Weights   02/10/15 0400 02/10/15 2106 02/12/15 0421  Weight: 57.1 kg (125 lb 14.1 oz) 52.4 kg (115 lb 8.3 oz) 52.9 kg (116 lb 10 oz)    Exam:   General:  Awake, sitting in chair, in nad  Cardiovascular: regular, s1, s2  Respiratory: normal resp effort, no wheezing  Abdomen: soft, distended, decreased BS  Musculoskeletal: perfused, no clubbing, no cyanosis  Data Reviewed: Basic Metabolic Panel:  Recent Labs Lab 02/09/15 0355 02/10/15 0507 02/10/15 1321 02/11/15 0445 02/12/15 0432  NA 143 138 140 140 141  K 2.8* 2.9* 3.7 3.6 3.3*  CL 105 102 105 105 102  CO2 28 28 26 29 29   GLUCOSE 135* 126* 225* 113* 111*  BUN 21* 14 12 11 11   CREATININE 0.84 0.58 0.73 0.54 0.59  CALCIUM 8.1* 8.2* 8.3* 8.2* 8.6*  MG 1.9  --  1.8  --   --    Liver Function Tests:  Recent Labs Lab 02/07/15 2058 02/08/15 0335  AST 23 13*  ALT 15 12*  ALKPHOS 85 87  BILITOT 0.8 0.3  PROT 7.1 6.7  ALBUMIN 2.9* 2.8*  Recent Labs Lab 02/07/15 2058  LIPASE 13*   No results for input(s): AMMONIA in the last 168 hours. CBC:  Recent Labs Lab 02/07/15 2058 02/08/15 0335 02/09/15 0355 02/09/15 1950 02/09/15 2310 02/10/15 0507 02/11/15 0445 02/12/15 0432  WBC 33.2* 28.9* 20.2*  --   --  17.6* 14.7* 14.2*  NEUTROABS 26.8* 22.8*  --   --   --   --   --   --   HGB 12.1 11.9* 11.6* 12.2 11.4* 11.8* 12.1 12.4  HCT 37.4 37.1 35.7* 37.6 35.5* 37.7 37.6 38.8  MCV 89.9 91.2 91.3  --   --  90.6 90.4 90.4  PLT 383 326 340  --   --  358 346 360   Cardiac Enzymes: No results for input(s): CKTOTAL, CKMB, CKMBINDEX, TROPONINI in the last 168 hours. BNP (last 3 results)  Recent Labs  01/16/15 1119  BNP 144.3*     ProBNP (last 3 results) No results for input(s): PROBNP in the last 8760 hours.  CBG:  Recent Labs Lab 02/11/15 2004 02/11/15 2328 02/12/15 0348 02/12/15 0725 02/12/15 1144  GLUCAP 174* 145* 106* 120* 221*    Recent Results (from the past 240 hour(s))  Urine culture     Status: None (Preliminary result)   Collection Time: 02/07/15 10:04 PM  Result Value Ref Range Status   Specimen Description URINE, RANDOM  Final   Special Requests NONE  Final   Culture   Final    >=100,000 COLONIES/mL PROTEUS MIRABILIS Performed at [facility]    Report Status PENDING  Incomplete   Organism ID, Bacteria PROTEUS MIRABILIS  Final      Susceptibility   Proteus mirabilis - MIC*    AMPICILLIN <=2 SENSITIVE Sensitive     CEFAZOLIN <=4 SENSITIVE Sensitive     CEFTRIAXONE <=1 SENSITIVE Sensitive     CIPROFLOXACIN 1 SENSITIVE Sensitive     GENTAMICIN <=1 SENSITIVE Sensitive     IMIPENEM 8 INTERMEDIATE Intermediate     NITROFURANTOIN 128 RESISTANT Resistant     TRIMETH/SULFA <=20 SENSITIVE Sensitive     AMPICILLIN/SULBACTAM <=2 SENSITIVE Sensitive     PIP/TAZO <=4 SENSITIVE Sensitive     * >=100,000 COLONIES/mL PROTEUS MIRABILIS  Surgical pcr screen     Status: None   Collection Time: 02/08/15  4:25 AM  Result Value Ref Range Status   MRSA, PCR NEGATIVE NEGATIVE Final   Staphylococcus aureus NEGATIVE NEGATIVE Final    Comment:        The Xpert SA Assay (FDA approved for NASAL specimens in patients over 90 years of age), is one component of a comprehensive surveillance program.  Test performance has been validated by [facility] for patients greater than or equal to 61 year old. It is not intended to diagnose infection nor to guide or monitor treatment.   Clostridium Difficile by PCR (not at [facility])     Status: None   Collection Time: 02/09/15  4:03 PM  Result Value Ref Range Status   C difficile by pcr NEGATIVE NEGATIVE Final     Studies: Dg Abd Portable  1v  02/11/2015   CLINICAL DATA:  Improved condition.  Abdominal distention.  EXAM: PORTABLE ABDOMEN - 1 VIEW  COMPARISON:  02/10/2015  FINDINGS: Left double-J ureteral stent unchanged in position. There is mild gaseous distention of both large and small bowel loops radiographically stable since prior study. There is moderate stool within the ascending colon. No evidence for free intraperitoneal air.  IMPRESSION: Persistent distention  of bowel loops most consistent with ileus.  Stable appearance of left ureteral stent.   Electronically Signed   By: Nolon Nations M.D.   On: 02/11/2015 13:05    Scheduled Meds: . amoxicillin-clavulanate  1 tablet Oral Q12H  . antiseptic oral rinse  7 mL Mouth Rinse BID  . insulin aspart  0-15 Units Subcutaneous TID WC  . insulin aspart  0-5 Units Subcutaneous QHS  . insulin glargine  10 Units Subcutaneous QHS  . levETIRAcetam  500 mg Intravenous BID  . saccharomyces boulardii  250 mg Oral BID   Continuous Infusions:   Principal Problem:   Sepsis Active Problems:   Seizure disorder   Pyelonephritis   Hydroureteronephrosis   Ureterolithiasis   Diarrhea   ARF (acute renal failure)    Amadou Katzenstein K  [facility] Pager [phone]. If 7PM-7AM, please contact night-coverage at [email or web address], password [facility] 02/12/2015, 5:00 PM  LOS: 4 days

## 2015-02-12 NOTE — Progress Notes (Signed)
Occupational Therapy Treatment Patient Details Name: April [name] MRN: [id number] DOB: 01-20-1941 Today's Date: 02/12/2015    History of present illness 74 yo female admitted for sepsis, UTI now s/p ureteral stent.   Hx of poorly controlled DM, HTN, CVA, Sz, aphasia, residual visual field impairment R eye. Pt is from ALF   OT comments  Pt with good participation  Follow Up Recommendations  Home health OT;Other (comment) (at ALF)    Equipment Recommendations  None recommended by OT    Recommendations for Other Services      Precautions / Restrictions Precautions Precautions: Fall       Mobility Bed Mobility Overal bed mobility: Needs Assistance Bed Mobility: Supine to Sit     Supine to sit: Supervision     General bed mobility comments: for safety  Transfers Overall transfer level: Needs assistance Equipment used: 1 person hand held assist Transfers: Sit to/from Stand Sit to Stand: Min assist Stand pivot transfers: Min assist       General transfer comment: assist to rise and steady, pt with urinary incontinence upon standing so pivoted to [facility], upon attempting to ambulate again pt then incontinent of loose gel-like BM, assisted pt with hygiene then switched out [facility] for recliner    Balance             Standing balance-Leahy Scale: Good                     ADL Overall ADL's : Needs assistance/impaired     Grooming: Wash/dry hands;Set up;Sitting;Wash/dry face;Supervision/safety                   Toilet Transfer: Minimal Print production planner Details (indicate cue type and reason): sit to stand from bed. VC for safety and hand and feet placement Toileting- Clothing Manipulation and Hygiene: Moderate assistance;Sit to/from stand         General ADL Comments: pt had taken stool softener and was worried it was going to work during OT session but it did not. Instructed pt to call RN when she felt it was working      Water quality scientist During Therapy: [facility] for tasks assessed/performed Overall Cognitive Status: History of cognitive impairments - at baseline                       Extremity/Trunk Assessment                          Pertinent Vitals/ Pain       Pain Assessment: No/denies pain     Prior Functioning/Environment              Frequency       Progress Toward Goals  OT Goals(current goals can now be found in the care plan section)  Progress towards OT goals: Progressing toward goals     Plan Discharge plan remains appropriate    Co-evaluation                 End of Session     Activity Tolerance Patient tolerated treatment well   Patient Left in bed with alarm on , call bell with in reach            Time: 1350-1410 OT Time  Calculation (min): 20 min  Charges: OT General Charges $OT Visit: 1 Procedure OT Treatments $Self Care/Home Management : 8-22 mins  Beva Remund, Thereasa Parkin 02/12/2015, 2:15 PM

## 2015-02-12 NOTE — Progress Notes (Signed)
CSW continuing to follow.  Pt admitted from [facility] and plans to return when medically ready for discharge.  Per MD, pt not yet medically ready for discharge.  CSW updated [facility] [facility].  CSW to continue to follow to provide support and assist with pt return to [facility] when pt medically ready for discharge.  Alison Murray, MSW, [place] Work [phone]

## 2015-02-13 LAB — CBC
HEMATOCRIT: 37.5 % (ref 36.0–46.0)
HEMOGLOBIN: 12.2 g/dL (ref 12.0–15.0)
MCH: 29.2 pg (ref 26.0–34.0)
MCHC: 32.5 g/dL (ref 30.0–36.0)
MCV: 89.7 fL (ref 78.0–100.0)
Platelets: 363 10*3/uL (ref 150–400)
RBC: 4.18 MIL/uL (ref 3.87–5.11)
RDW: 13.8 % (ref 11.5–15.5)
WBC: 13.7 10*3/uL — AB (ref 4.0–10.5)

## 2015-02-13 LAB — GLUCOSE, CAPILLARY
GLUCOSE-CAPILLARY: 106 mg/dL — AB (ref 65–99)
Glucose-Capillary: 175 mg/dL — ABNORMAL HIGH (ref 65–99)

## 2015-02-13 LAB — BASIC METABOLIC PANEL
Anion gap: 8 (ref 5–15)
BUN: 11 mg/dL (ref 6–20)
CHLORIDE: 103 mmol/L (ref 101–111)
CO2: 30 mmol/L (ref 22–32)
Calcium: 8.6 mg/dL — ABNORMAL LOW (ref 8.9–10.3)
Creatinine, Ser: 0.52 mg/dL (ref 0.44–1.00)
GFR calc Af Amer: >60 mL/min (ref >60)
GLUCOSE: 112 mg/dL — AB (ref 65–99)
POTASSIUM: 3.1 mmol/L — AB (ref 3.5–5.1)
Sodium: 141 mmol/L (ref 135–145)

## 2015-02-13 LAB — URINE CULTURE: Culture: >=100000

## 2015-02-13 MED ORDER — POTASSIUM CHLORIDE CRYS ER 20 MEQ PO TBCR: 40.0000 meq | EXTENDED_RELEASE_TABLET | Freq: Every day | ORAL | Status: DC

## 2015-02-13 MED ORDER — SACCHAROMYCES BOULARDII 250 MG PO CAPS: 250.0000 mg | ORAL_CAPSULE | Freq: Two times a day (BID) | ORAL | Status: DC

## 2015-02-13 MED ORDER — BISACODYL 10 MG RE SUPP: 10.0000 mg | Freq: Every day | RECTAL | Status: DC

## 2015-02-13 MED ORDER — INSULIN DETEMIR 100 UNIT/ML ~~LOC~~ SOLN: 10.0000 [IU] | Freq: Every day | SUBCUTANEOUS | Status: DC

## 2015-02-13 MED ORDER — BISACODYL 10 MG RE SUPP
10.0000 mg | Freq: Every day | RECTAL | Status: DC
  Administered 2015-02-13: 10 mg via RECTAL
  Filled 2015-02-13: qty 1

## 2015-02-13 MED ORDER — POTASSIUM CHLORIDE CRYS ER 20 MEQ PO TBCR
40.0000 meq | EXTENDED_RELEASE_TABLET | Freq: Two times a day (BID) | ORAL | Status: DC
  Administered 2015-02-13: 40 meq via ORAL
  Filled 2015-02-13: qty 2

## 2015-02-13 MED ORDER — AMOXICILLIN-POT CLAVULANATE 875-125 MG PO TABS: 1.0000 | ORAL_TABLET | Freq: Two times a day (BID) | ORAL | Status: DC

## 2015-02-13 NOTE — Discharge Summary (Signed)
Physician Discharge Summary  April [name] QPR:[id number] DOB: 12/05/1940 DOA: 02/07/2015  PCP: Durenda Age, NP  Admit date: 02/07/2015 Discharge date: 02/13/2015  Time spent: 20 minutes  Recommendations for Outpatient Follow-up:  1. Follow up with PCP in 1-2 weeks 2. Please repeat renal panel within 1-2 weeks, focus on potassium 3. Follow up with General Surgery in 3 weeks 4. Follow up with Urology, to be arranged 5. Please ensure daily bowel movements. If constipation persists, recommend referral to GI as outpatient  Discharge Diagnoses:  Principal Problem:   Sepsis Active Problems:   Seizure disorder   Pyelonephritis   Hydroureteronephrosis   Ureterolithiasis   Diarrhea   ARF (acute renal failure)   Discharge Condition: Improved  Diet recommendation: diabetic  Filed Weights   02/10/15 2106 02/12/15 0421 02/13/15 0441  Weight: 52.4 kg (115 lb 8.3 oz) 52.9 kg (116 lb 10 oz) 52.6 kg (115 lb 15.4 oz)    History of present illness:  Please see admit h and p from 6/23 for details. Briefly, pt presented with fevers and chills with L flank pain, found to have L sided ureteric stone and obstruction with sepsis and uti. The patient was admitted for further work up.  Hospital Course:   Sepsis with UTI  Leukocytosis improving  Sepsis resolved  Urine cx with Proteus mirabilis  Pt was initially continued on empiric zosyn  Transitioned to PO augmentin per Urology recs for total of 10-14 days of abx (stop date on 02/20/15)  L obstructing distal ureteral stone  Urology was consulted and pt is s/p stent placement on 6/23  Ultimate plans for cystoscopy and stone removal as outpatient once UTI is fully resolved  Urology to arrange follow up as outpatient  Diarrhea/constipation  Recent CT abd on admit was personally reviewed  Large amount to stool was seen throughout  Very good results with cathartics  Added florastor  cdiff neg  F/u abd xray w/ bowel  gas, stool in ascending colon and ileus  Pt seems to respond well with dulcolax suppository and will be advised to take a dulcolax suppository on a daily basis, holding only for diarrhea  Question if prior CVA and/or long-term immobility may be contributing to constipation  Mass around terminal ileum   Noted on CT abd/pelvis  Have consulted General Surgery  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  Surgery plans to follow up as outpatient once pt is no longer septic from UTI  ARF  Renal fx has improved this admission  Would continue to follow  DM2  On SSI coverage while admitted  Pt initially noted to be mildly hypoglycemic  Pt was continued on 10 units of lantus (decreasd from 20 units) and glucose remained stable  Hx seizures  Stable  Continued keppra  Chronic diastolic CHF  Stable  Euvolemic  DVT prophylaxis  SCD's  Hypokalemia  Replaced  Would continue daily replacement  Advise renal panel/lytes to be repeated within 1-2 weeks from discharge  Procedures:  Ureteral stent placement 6/23  Consultations:  Urology  General Surgery  Discharge Exam: Filed Vitals:   02/12/15 1500 02/12/15 2053 02/13/15 0439 02/13/15 0441  BP: 158/80 138/58 177/90 150/65  Pulse: 77 82 78   Temp: 98.5 F (36.9 C) 100 F (37.8 C) 98.5 F (36.9 C)   TempSrc: Oral Oral Oral   Resp: 18 20 20    Height:      Weight:    52.6 kg (115 lb 15.4 oz)  SpO2: 95%  94% 94%     General: Awake, in nad Cardiovascular: regular, s1, s2 Respiratory: normal resp effort, no wheezing  Discharge Instructions     Medication List    STOP taking these medications        pantoprazole 40 MG tablet  Commonly known as:  PROTONIX      TAKE these medications        amoxicillin-clavulanate 875-125 MG per tablet  Commonly known as:  AUGMENTIN  Take 1 tablet by mouth every 12 (twelve) hours.     aspirin EC 325 MG tablet  Take 1 tablet (325 mg total) by  mouth daily.     atorvastatin 20 MG tablet  Commonly known as:  LIPITOR  Take 1 tablet (20 mg total) by mouth daily at 6 PM.     bisacodyl 10 MG suppository  Commonly known as:  DULCOLAX  Place 1 suppository (10 mg total) rectally daily.     carvedilol 3.125 MG tablet  Commonly known as:  COREG  Take 1 tablet (3.125 mg total) by mouth 2 (two) times daily with a meal.     furosemide 40 MG tablet  Commonly known as:  LASIX  Take 1 tablet daily. If you gain > 3lbs in 1 day or 5 lbs in 1 week, take an extra dose.     glimepiride 2 MG tablet  Commonly known as:  AMARYL  Take 1 tablet (2 mg total) by mouth daily with breakfast.     insulin detemir 100 UNIT/ML injection  Commonly known as:  LEVEMIR  Inject 0.1 mLs (10 Units total) into the skin daily.     isosorbide dinitrate 5 MG tablet  Commonly known as:  ISORDIL  Take 1 tablet (5 mg total) by mouth 3 (three) times daily. Take 1 tablet by mouth three times a day     levETIRAcetam 500 MG tablet  Commonly known as:  KEPPRA  Take 1 tablet (500 mg total) by mouth 2 (two) times daily.     omeprazole 20 MG capsule  Commonly known as:  PRILOSEC  Take 20 mg by mouth daily.     ondansetron 8 MG disintegrating tablet  Commonly known as:  ZOFRAN ODT  Take 1 tablet (8 mg total) by mouth every 8 (eight) hours as needed for nausea or vomiting.     potassium chloride SA 20 MEQ tablet  Commonly known as:  K-DUR,KLOR-CON  Take 2 tablets (40 mEq total) by mouth daily.     PROTEIN PO  Take 2 scoop by mouth 2 (two) times daily. *Procell Protein Supplement*     saccharomyces boulardii 250 MG capsule  Commonly known as:  FLORASTOR  Take 1 capsule (250 mg total) by mouth 2 (two) times daily.       Allergies  Allergen Reactions  . Codeine Nausea And Vomiting  . Sulfa Antibiotics Nausea And Vomiting   Follow-up Information    Follow up with [facility]. Schedule an appointment as soon as possible for a visit in 3 weeks.    Specialty:  General Surgery   Why:  For mesenteric mass seen on CT scan while in hospital   Contact information:   [address] [place] [address] [phone]       Follow up with [facility], NP. Schedule an appointment as soon as possible for a visit in 1 week.   Specialty:  Internal Medicine   Why:  Hospital follow up   Contact information:  [address] [address] [phone]       Schedule an appointment as soon as possible for a visit with Jorja Loa, MD.   Specialty:  Urology   Why:  Hospital follow up   Contact information:   [place] [address] [phone]        The results of significant diagnostics from this hospitalization (including imaging, microbiology, ancillary and laboratory) are listed below for reference.    Significant Diagnostic Studies: Dg Chest 2 View  01/16/2015   CLINICAL DATA:  Hypoglycemia, altered mental status  EXAM: CHEST  2 VIEW  COMPARISON:  PA and lateral chest of October 22, 2014  FINDINGS: The cardiac silhouette is enlarged. The pulmonary vascularity is engorged. The pulmonary interstitial markings are increased. There is are small pleural effusions layering posteriorly. There is no alveolar infiltrate. The bony structures exhibit no acute abnormalities.  IMPRESSION: CHF with pulmonary interstitial edema and small pleural effusions. There is no alveolar pneumonia.   Electronically Signed   By: David  Martinique M.D.   On: 01/16/2015 09:08   Ct Abdomen Pelvis W Contrast  02/07/2015   CLINICAL DATA:  Patient presents with nausea, vomiting, diarrhea and hypotension.  EXAM: CT ABDOMEN AND PELVIS WITH CONTRAST  TECHNIQUE: Multidetector CT imaging of the abdomen and pelvis was performed using the standard protocol following bolus administration of intravenous contrast.  CONTRAST:  51mL OMNIPAQUE IOHEXOL 300 MG/ML SOLN, 36mL OMNIPAQUE IOHEXOL 300 MG/ML SOLN  COMPARISON:  None.  FINDINGS:  Lower chest: Visualized heart is unremarkable. Patchy opacities within the left lung base. No pleural effusion. Incompletely visualized within the left lower lobe (image 1; series 2) is a more masslike focal area of consolidation.  Hepatobiliary: Liver is normal in size and contour without focal hepatic lesion identified. Small gallstone within the gallbladder lumen. Probable layering sludge within the gallbladder lumen. No gallbladder wall thickening.  Pancreas: Unremarkable  Spleen: Unremarkable  Adrenals/Urinary Tract: Right adrenal myelolipoma. Nonobstructing 3 mm stone interpolar region right kidney. Too small to characterize low-attenuation lesion superior pole right kidney. Left-greater-than-right perinephric fat stranding. There is moderate left hydroureteronephrosis to the level of the urinary bladder were there is an obstructing 5 mm stone. There is circumferential wall thickening of the urinary bladder as well as urothelial hyper-enhancement and thickening of the left and right ureters.  Stomach/Bowel: Large amount of stool within the rectum and colon. No evidence for bowel obstruction. No free fluid or free intraperitoneal air. The stomach is unremarkable.  There is a 1.6 cm enhancing mass immediately adjacent to the terminal ileum which is connected with a right lower quadrant mesenteric mass measuring 3.6 x 2.9 cm (image 46; series 2).  Vascular/Lymphatic: Normal caliber abdominal aorta.  Other: Status post hysterectomy.  Musculoskeletal: Old healed right lateral seventh rib fracture. Lower lumbar spine degenerative changes. No aggressive or acute appearing osseous lesions.  IMPRESSION: Enhancing right lower quadrant mass which appears to be immediately adjacent to the terminal ileum is concerning for malignancy, potentially representing carcinoid tumor.  Obstructing 5 mm stone at the left UVJ with resultant moderate left hydroureteronephrosis.  Additionally there is left-greater-than-right perinephric  fat stranding, bilateral urothelial thickening and hyper enhancement of the ureters and bladder wall thickening. Overall findings are concerning for cystitis and likely ascending urinary tract infection.  Large amount of stool within the rectum and colon as can be seen with constipation.  Cholelithiasis and sludge within the gallbladder lumen.  Patchy consolidative opacities within the  left lung base favored to represent dependent atelectasis. More masslike area of consolidation within the left lower lobe stool favored to represent atelectasis or infectious process however metastatic disease would not be entirely excluded given the abdominal mass.   Electronically Signed   By: Lovey Newcomer M.D.   On: 02/07/2015 23:17   Dg Abd Portable 1v  02/11/2015   CLINICAL DATA:  Improved condition.  Abdominal distention.  EXAM: PORTABLE ABDOMEN - 1 VIEW  COMPARISON:  02/10/2015  FINDINGS: Left double-J ureteral stent unchanged in position. There is mild gaseous distention of both large and small bowel loops radiographically stable since prior study. There is moderate stool within the ascending colon. No evidence for free intraperitoneal air.  IMPRESSION: Persistent distention of bowel loops most consistent with ileus.  Stable appearance of left ureteral stent.   Electronically Signed   By: Nolon Nations M.D.   On: 02/11/2015 13:05   Dg Abd Portable 1v  02/10/2015   CLINICAL DATA:  Diffuse abdominal pain, history of left ureteral stent placement  EXAM: PORTABLE ABDOMEN - 1 VIEW  COMPARISON:  02/07/2015  FINDINGS: Scattered large and small bowel gas is noted. A left ureteral stent is noted. The known distal left ureteral stone is noted adjacent to the distal aspect of the stent. No acute bony abnormality is seen. No free air is noted. A few scattered right renal calculi are noted.  IMPRESSION: Left ureteral stent in satisfactory position. Left ureteral and right renal stones are noted. No acute abnormality seen.    Electronically Signed   By: Inez Catalina M.D.   On: 02/10/2015 12:32    Microbiology: Recent Results (from the past 240 hour(s))  Urine culture     Status: None   Collection Time: 02/07/15 10:04 PM  Result Value Ref Range Status   Specimen Description URINE, RANDOM  Final   Special Requests NONE  Final   Culture   Final    >=100,000 COLONIES/mL PROTEUS MIRABILIS Performed at [facility]    Report Status 02/13/2015 FINAL  Final   Organism ID, Bacteria PROTEUS MIRABILIS  Final      Susceptibility   Proteus mirabilis - MIC*    AMPICILLIN <=2 SENSITIVE Sensitive     CEFAZOLIN <=4 SENSITIVE Sensitive     CEFTRIAXONE <=1 SENSITIVE Sensitive     CIPROFLOXACIN 1 SENSITIVE Sensitive     GENTAMICIN <=1 SENSITIVE Sensitive     IMIPENEM 8 INTERMEDIATE Intermediate     NITROFURANTOIN 128 RESISTANT Resistant     TRIMETH/SULFA <=20 SENSITIVE Sensitive     AMPICILLIN/SULBACTAM <=2 SENSITIVE Sensitive     PIP/TAZO <=4 SENSITIVE Sensitive     * >=100,000 COLONIES/mL PROTEUS MIRABILIS  Surgical pcr screen     Status: None   Collection Time: 02/08/15  4:25 AM  Result Value Ref Range Status   MRSA, PCR NEGATIVE NEGATIVE Final   Staphylococcus aureus NEGATIVE NEGATIVE Final    Comment:        The Xpert SA Assay (FDA approved for NASAL specimens in patients over 61 years of age), is one component of a comprehensive surveillance program.  Test performance has been validated by [facility] for patients greater than or equal to 48 year old. It is not intended to diagnose infection nor to guide or monitor treatment.   Clostridium Difficile by PCR (not at [facility])     Status: None   Collection Time: 02/09/15  4:03 PM  Result Value Ref Range Status   C difficile  by pcr NEGATIVE NEGATIVE Final     Labs: Basic Metabolic Panel:  Recent Labs Lab 02/09/15 0355 02/10/15 0507 02/10/15 1321 02/11/15 0445 02/12/15 0432 02/13/15 0453  NA 143 138 140 140 141 141  K 2.8* 2.9* 3.7 3.6  3.3* 3.1*  CL 105 102 105 105 102 103  CO2 28 28 26 29 29 30   GLUCOSE 135* 126* 225* 113* 111* 112*  BUN 21* 14 12 11 11 11   CREATININE 0.84 0.58 0.73 0.54 0.59 0.52  CALCIUM 8.1* 8.2* 8.3* 8.2* 8.6* 8.6*  MG 1.9  --  1.8  --   --   --    Liver Function Tests:  Recent Labs Lab 02/07/15 2058 02/08/15 0335  AST 23 13*  ALT 15 12*  ALKPHOS 85 87  BILITOT 0.8 0.3  PROT 7.1 6.7  ALBUMIN 2.9* 2.8*    Recent Labs Lab 02/07/15 2058  LIPASE 13*   No results for input(s): AMMONIA in the last 168 hours. CBC:  Recent Labs Lab 02/07/15 2058 02/08/15 0335 02/09/15 0355  02/09/15 2310 02/10/15 0507 02/11/15 0445 02/12/15 0432 02/13/15 0453  WBC 33.2* 28.9* 20.2*  --   --  17.6* 14.7* 14.2* 13.7*  NEUTROABS 26.8* 22.8*  --   --   --   --   --   --   --   HGB 12.1 11.9* 11.6*  < > 11.4* 11.8* 12.1 12.4 12.2  HCT 37.4 37.1 35.7*  < > 35.5* 37.7 37.6 38.8 37.5  MCV 89.9 91.2 91.3  --   --  90.6 90.4 90.4 89.7  PLT 383 326 340  --   --  358 346 360 363  < > = values in this interval not displayed. Cardiac Enzymes: No results for input(s): CKTOTAL, CKMB, CKMBINDEX, TROPONINI in the last 168 hours. BNP: BNP (last 3 results)  Recent Labs  01/16/15 1119  BNP 144.3*    ProBNP (last 3 results) No results for input(s): PROBNP in the last 8760 hours.  CBG:  Recent Labs Lab 02/12/15 1144 02/12/15 1655 02/12/15 2051 02/13/15 0744 02/13/15 1141  GLUCAP 221* 122* 174* 106* 175*    Signed:  CHIU, STEPHEN K  [facility] 02/13/2015, 12:31 PM

## 2015-02-13 NOTE — Progress Notes (Signed)
Pt for discharge to [facility].   CSW facilitated pt discharge needs including contacting facility, faxing pt discharge information to the facility, speaking with RN, Tamera Stands who confirmed discharge information received and reviewed and pt can return to facility today.   CSW discussed with pt and pt niece at bedside. CSW provided discharge packet to pt and pt niece. Pt niece plans to transport pt via private vehicle to  [facility].  [facility] has in house PT/OT at ALF.   No further social work needs identified at this time.  CSW signing off.   Alison Murray, MSW, [place] [facility] [phone]

## 2015-02-14 ENCOUNTER — Telehealth: Payer: Self-pay | Admitting: *Deleted

## 2015-02-14 NOTE — Telephone Encounter (Signed)
Patient daughter, [name] called wanting to speak with Monina regarding her mother at the facility. Directed her to call the facility and go through the DON with concerns. She agreed.

## 2015-03-19 ENCOUNTER — Ambulatory Visit: Payer: Medicare Other | Admitting: Psychology

## 2015-03-20 ENCOUNTER — Encounter: Payer: Medicare Other | Admitting: Psychology

## 2015-03-23 ENCOUNTER — Encounter: Payer: Self-pay | Admitting: Neurology

## 2015-03-23 ENCOUNTER — Ambulatory Visit (INDEPENDENT_AMBULATORY_CARE_PROVIDER_SITE_OTHER): Payer: Medicare HMO | Admitting: Neurology

## 2015-03-23 VITALS — BP 103/67 | HR 88 | Ht 59.0 in | Wt 114.4 lb

## 2015-03-23 DIAGNOSIS — G40209 Localization-related (focal) (partial) symptomatic epilepsy and epileptic syndromes with complex partial seizures, not intractable, without status epilepticus: Secondary | ICD-10-CM | POA: Diagnosis not present

## 2015-03-23 NOTE — Patient Instructions (Signed)
I had a long d/w patient and daughter about her remote stroke, risk for recurrent stroke/TIAs, personally independently reviewed imaging studies and stroke evaluation results and answered questions.Continue aspirin 325 mg orally every day  for secondary stroke prevention and maintain strict control of hypertension with blood pressure goal below 130/90, diabetes with hemoglobin A1c goal below 6.5% and lipids with LDL cholesterol goal below 100 mg/dL. I also advised the patient to eat a healthy diet with plenty of whole grains, cereals, fruits and vegetables, exercise regularly and maintain ideal body weight .continue Keppra for seizures and the current dosage. Check EEG for any silent seizure activity. I also advised the patient fall and safety precautions and advised her to use the walker at all times. Followup in the future with me in 6 months or call earlier if necessary Fall Prevention and [place] cause injuries and can affect all age groups. It is possible to use preventive measures to significantly decrease the likelihood of falls. There are many simple measures which can make your home safer and prevent falls. OUTDOORS  Repair cracks and edges of walkways and driveways.  Remove high doorway thresholds.  Trim shrubbery on the main path into your home.  Have good outside lighting.  Clear walkways of tools, rocks, debris, and clutter.  Check that handrails are not broken and are securely fastened. Both sides of steps should have handrails.  Have leaves, snow, and ice cleared regularly.  Use sand or salt on walkways during winter months.  In the garage, clean up grease or oil spills. BATHROOM  Install night lights.  Install grab bars by the toilet and in the tub and shower.  Use non-skid mats or decals in the tub or shower.  Place a plastic non-slip stool in the shower to sit on, if needed.  Keep floors dry and clean up all water on the floor immediately.  Remove soap  buildup in the tub or shower on a regular basis.  Secure bath mats with non-slip, double-sided rug tape.  Remove throw rugs and tripping hazards from the floors. BEDROOMS  Install night lights.  Make sure a bedside light is easy to reach.  Do not use oversized bedding.  Keep a telephone by your bedside.  Have a firm chair with side arms to use for getting dressed.  Remove throw rugs and tripping hazards from the floor. KITCHEN  Keep handles on pots and pans turned toward the center of the stove. Use back burners when possible.  Clean up spills quickly and allow time for drying.  Avoid walking on wet floors.  Avoid hot utensils and knives.  Position shelves so they are not too high or low.  Place commonly used objects within easy reach.  If necessary, use a sturdy step stool with a grab bar when reaching.  Keep electrical cables out of the way.  Do not use floor polish or wax that makes floors slippery. If you must use wax, use non-skid floor wax.  Remove throw rugs and tripping hazards from the floor. STAIRWAYS  Never leave objects on stairs.  Place handrails on both sides of stairways and use them. Fix any loose handrails. Make sure handrails on both sides of the stairways are as long as the stairs.  Check carpeting to make sure it is firmly attached along stairs. Make repairs to worn or loose carpet promptly.  Avoid placing throw rugs at the top or bottom of stairways, or properly secure the rug with carpet tape to  prevent slippage. Get rid of throw rugs, if possible.  Have an electrician put in a light switch at the top and bottom of the stairs. OTHER FALL PREVENTION TIPS  Wear low-heel or rubber-soled shoes that are supportive and fit well. Wear closed toe shoes.  When using a stepladder, make sure it is fully opened and both spreaders are firmly locked. Do not climb a closed stepladder.  Add color or contrast paint or tape to grab bars and handrails in  your home. Place contrasting color strips on first and last steps.  Learn and use mobility aids as needed. Install an electrical emergency response system.  Turn on lights to avoid dark areas. Replace light bulbs that burn out immediately. Get light switches that glow.  Arrange furniture to create clear pathways. Keep furniture in the same place.  Firmly attach carpet with non-skid or double-sided tape.  Eliminate uneven floor surfaces.  Select a carpet pattern that does not visually hide the edge of steps.  Be aware of all pets. OTHER HOME SAFETY TIPS  Set the water temperature for 120 F (48.8 C).  Keep emergency numbers on or near the telephone.  Keep smoke detectors on every level of the home and near sleeping areas. Document Released: 07/25/2002 Document Revised: 02/03/2012 Document Reviewed: 10/24/2011 [facility] Patient Information 2015 [place], [place]. This information is not intended to replace advice given to you by your health care provider. Make sure you discuss any questions you have with your health care provider.

## 2015-03-23 NOTE — Progress Notes (Signed)
PATIENT: April [name] DOB: 1940-09-30  HISTORICAL  April [name] is a 74 years old female, referred by her primary care nurse practitioner Debbrah Alar, accompanied by her niece Meriel Flavors, who is in this process of become her power of attorney, and her niece Zigmund Daniel at today's evaluation,  She had past medical history of hypertension, hyperlipidemia, diabetes, most recent A1c was 10.7, she was admitted to the hospital March 5 to October 25 2014, after she was found by her neighbor that she is confused  Had extensive evaluation during her hospital stay, EEG October 23 2014 showed buildup of occipital spike and slow wave activity from the left  occipital region. It achieves a frequency of 3-4 Hz with some spread noted to the right hemisphere as well. These episodes last from 100 to 155 seconds with normal awake background  activity returning after these periods. Suggestive of subclinical seizure, she was put on Keppra 500 mg twice a day, repeat EEG October 25 2014, showed significant improvement  Echocardiogram ejection fraction 60%, ultrasound of carotid artery showed less than 39% stenosis, anterograde flow of bilateral vertebral system   I have reviewed MRI of brain, MRA of the brain October 21 2014, MRI of the brain October 23 2014: 5 mm focus of restricted diffusion within the inferior right thalamus as above, highly suspicious for a small acute ischemic  infarct. Generalized cerebral atrophy with moderate chronic microvascular ischemic disease. There was no significant medium large size vessel disease by MRA of the brain, Laboratory evaluation showed  LDL 46, A1c 10.7, normal vitamin B12, TSH,   Before she was admitted to the hospital, she lives by herself, has widowed for many years, has no relationship to her only son, was still driving, but he was noticed by her nieces, who intact with her frequently, she has gradual onset memory trouble, also moody, tends to cry, she has been  missing her diabetic, hypertension, hyperlipidemia medication regularly,   Since hospital admission, she was discharged to [facility], continue has intermittent memory loss, mild confusion, in addition, she was noted to have increased gait difficulty, urinary urgency, occasionally bladder accident, constipation, with laxative, she occasionally has bowel incontinence, contributed to not able to make to the bathroom by Asst. in timely fashion,   She is tearful emotional during today's interview, she will be moved to a long-term assisted living, her niece is in the process of getting power of attorney  Update 03/23/2015 : She is accompanied today by her daughter following her last visit with Dr. Krista Blue in March 2016. She reports overall significant improvement in her confusion and memory difficulties. She's had no recurrent stroke or TIA symptoms since my last hospital visit with her in March 2015 when she had a small right thalamic infarct. She also had a urinary tract infection at that time. Patient is now been diagnosed with a new colonic mass and plans to see GI surgeon Dr. Marcello Moores for consideration for surgery. She is presently living in assisted living and has recently started walking with a walker and is doing well. She is tolerating Keppra well without any side effects. She has had no recurrent seizure episodes. She states her blood pressure is normal and today it is slightly low at 103/67. Her fasting sugars have ranged consistently in the 90 and today was 93 mg percent on Mini-Mental status exam today she scored 29/30.Marland Kitchen REVIEW OF SYSTEMS: Full 14 system review of systems performed and notable only for memory loss, confusion,  activity change and difficulty walking all other systems negative  ALLERGIES: Allergies  Allergen Reactions  . Codeine Nausea And Vomiting  . Sulfa Antibiotics Nausea And Vomiting    HOME MEDICATIONS: Current Outpatient Prescriptions  Medication Sig Dispense Refill  .  amoxicillin-clavulanate (AUGMENTIN) 875-125 MG per tablet Take 1 tablet by mouth every 12 (twelve) hours. 14 tablet 0  . aspirin EC 325 MG tablet Take 1 tablet (325 mg total) by mouth daily. 30 tablet 0  . atorvastatin (LIPITOR) 20 MG tablet Take 1 tablet (20 mg total) by mouth daily at 6 PM.    . bisacodyl (DULCOLAX) 10 MG suppository Place 1 suppository (10 mg total) rectally daily. 12 suppository 0  . carvedilol (COREG) 3.125 MG tablet Take 1 tablet (3.125 mg total) by mouth 2 (two) times daily with a meal.    . furosemide (LASIX) 40 MG tablet Take 1 tablet daily. If you gain > 3lbs in 1 day or 5 lbs in 1 week, take an extra dose. 60 tablet   . glimepiride (AMARYL) 2 MG tablet Take 1 tablet (2 mg total) by mouth daily with breakfast.    . insulin detemir (LEVEMIR) 100 UNIT/ML injection Inject 0.1 mLs (10 Units total) into the skin daily. 10 mL 0  . isosorbide dinitrate (ISORDIL) 5 MG tablet Take 1 tablet (5 mg total) by mouth 3 (three) times daily. Take 1 tablet by mouth three times a day 30 tablet 2  . levETIRAcetam (KEPPRA) 500 MG tablet Take 1 tablet (500 mg total) by mouth 2 (two) times daily.    Marland Kitchen omeprazole (PRILOSEC) 20 MG capsule Take 20 mg by mouth daily.    . ondansetron (ZOFRAN ODT) 8 MG disintegrating tablet Take 1 tablet (8 mg total) by mouth every 8 (eight) hours as needed for nausea or vomiting. 20 tablet 0  . potassium chloride SA (K-DUR,KLOR-CON) 20 MEQ tablet Take 2 tablets (40 mEq total) by mouth daily. 30 tablet 0  . PROTEIN PO Take 2 scoop by mouth 2 (two) times daily. *Procell Protein Supplement*    . saccharomyces boulardii (FLORASTOR) 250 MG capsule Take 1 capsule (250 mg total) by mouth 2 (two) times daily. 30 capsule 0   No current facility-administered medications for this visit.    PAST MEDICAL HISTORY: Past Medical History  Diagnosis Date  . Diabetes mellitus   . Hypertension   . Retinopathy 06/06/2013    Per eye exam 05/31/13 eye exam, Moderate retinopathy     . Aphasia   . Seizures   . Rectum bleeding   . Stroke   . HYPERLIPIDEMIA 10/15/2006    Qualifier: Diagnosis of  By: Beryle Lathe    . GERD (gastroesophageal reflux disease) 05/06/2013  . Gram-positive cocci bacteremia   . Seizure disorder, status epilepticus, nonconvulsive   . Cerebral infarction due to unspecified mechanism   . Uncontrolled type 2 diabetes mellitus with insulin therapy 01/17/2015  . Chronic diastolic CHF (congestive heart failure), grade I 01/18/2015    PAST SURGICAL HISTORY: Past Surgical History  Procedure Laterality Date  . Appendectomy  1972  . Abdominal hysterectomy  1972  . Tee without cardioversion N/A 10/24/2014    Procedure: TRANSESOPHAGEAL ECHOCARDIOGRAM (TEE);  Surgeon: Dorothy Spark, MD;  Location: [facility];  Service: Cardiovascular;  Laterality: N/A;  . Cystoscopy w/ ureteral stent placement Left 02/08/2015    Procedure: CYSTOSCOPY WITH RETROGRADE left PYELOGRAM/URETERAL STENT PLACEMENT left insertion of foley;  Surgeon: Franchot Gallo, MD;  Location: [facility];  Service: Urology;  Laterality: Left;    FAMILY HISTORY: Family History  Problem Relation Age of Onset  . Arthritis Maternal Grandmother   . Heart disease Maternal Grandmother 12-01-1978  . Diabetes Mother   . Stomach cancer Mother     SOCIAL HISTORY:  History   Social History  . Marital Status: Widowed    Spouse Name: N/A  . Number of Children: 1  . Years of Education: 13   Occupational History  . Retired    Social History Main Topics  . Smoking status: Never Smoker   . Smokeless tobacco: Never Used  . Alcohol Use: No  . Drug Use: No  . Sexual Activity: Not on file   Other Topics Concern  . Not on file   Social History Narrative   Widowed, husband died in November 30, 2001.   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    Right-handed.   1 cup caffeine per day.   She is being cared for by her two neices.        PHYSICAL EXAM   Filed Vitals:   03/23/15 0832  BP: 103/67  Pulse: 88  Height: 4\' 11"  (1.499 m)  Weight: 114 lb 6.4 oz (51.891 kg)    Not recorded      Body mass index is 23.09 kg/(m^2).  PHYSICAL EXAMNIATION:  Gen: NAD, conversant, well nourised, obese, well groomed                     Cardiovascular: Regular rate rhythm, no peripheral edema, warm, nontender. Eyes: Conjunctivae clear without exudates or hemorrhage Neck: Supple, no carotid bruise. Pulmonary: Clear to auscultation bilaterally   NEUROLOGICAL EXAM:  MENTAL STATUS: Speech:    Speech is normal; fluent and spontaneous with normal comprehension.  Cognition:Mini-Mental Status Examination is 29out of 30 . Clock drawing 2/4    The patient is oriented to person, place, and time;     recent and remote memory intact;     language fluent;     normal attention, concentration,     fund of knowledge.  CRANIAL NERVES: CN II: Visual fields are full to confrontation. Fundoscopic exam is normal with sharp discs and no vascular changes. Venous pulsations are present bilaterally. Pupils are 4 mm and briskly reactive to light. Visual acuity is 20/20 bilaterally. CN III, IV, VI: extraocular movement are normal. No ptosis. CN V: Facial sensation is intact to pinprick in all 3 divisions bilaterally. Corneal responses are intact.  CN VII: Face is symmetric with normal eye closure and smile. CN VIII: Hearing is normal to rubbing fingers CN IX, X: Palate elevates symmetrically. Phonation is normal. CN XI: Head turning and shoulder shrug are intact CN XII: Tongue is midline with normal movements and no atrophy.  MOTOR: There is no pronator drift of out-stretched arms. Muscle bulk and tone are normal. Muscle strength is normal.   Shoulder abduction Shoulder external rotation Elbow flexion Elbow extension Wrist flexion Wrist extension  Finger abduction Hip flexion Knee flexion Knee extension Ankle dorsi flexion Ankle plantar flexion  R 5 5 5 5 5 5 5 5 5 5  4 5   L 5 5 5 5 5 5 5 5 5 5  4 5     REFLEXES: Reflexes are 2+ and symmetric at the biceps, triceps, knees, and ankles.  Plantar responses are flexor.  SENSORY: Bilateral lower extremity pitting edema, mildly length dependent decreased pinprick, vibratory sensation at toes,  Coordination : Rapid alternating movements and fine finger movements are intact. There is no dysmetria on finger-to-nose and heel-knee-shin. There are no abnormal or extraneous movements.   GAIT/STANCE: She needs assistance to get up from seated position walks with a wheeled walker. DIAGNOSTIC DATA (LABS, IMAGING, TESTING) - I reviewed patient records, labs, notes, testing and imaging myself where available.  Lab Results  Component Value Date   WBC 13.7* 02/13/2015   HGB 12.2 02/13/2015   HCT 37.5 02/13/2015   MCV 89.7 02/13/2015   PLT 363 02/13/2015      Component Value Date/Time   NA 141 02/13/2015 0453   K 3.1* 02/13/2015 0453   CL 103 02/13/2015 0453   CO2 30 02/13/2015 0453   GLUCOSE 112* 02/13/2015 0453   BUN 11 02/13/2015 0453   CREATININE 0.52 02/13/2015 0453   CREATININE 0.52 05/17/2013 1144   CALCIUM 8.6* 02/13/2015 0453   PROT 6.7 02/08/2015 0335   ALBUMIN 2.8* 02/08/2015 0335   AST 13* 02/08/2015 0335   ALT 12* 02/08/2015 0335   ALKPHOS 87 02/08/2015 0335   BILITOT 0.3 02/08/2015 0335   GFRNONAA >60 02/13/2015 0453   GFRAA >60 02/13/2015 0453   Lab Results  Component Value Date   CHOL 169 10/22/2014   HDL 28* 10/22/2014   LDLCALC 105* 10/22/2014   TRIG 178* 10/22/2014   CHOLHDL 6.0 10/22/2014   Lab Results  Component Value Date   HGBA1C 7.4* 01/16/2015   Lab Results  Component Value Date   VITAMINB12 605 10/22/2014   Lab Results  Component Value Date   TSH 1.533 10/22/2014      ASSESSMENT AND PLAN  Avarae Zwart Beals is a 74 y.o. 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 I also advised the patient to eat a healthy diet with plenty of whole grains, cereals, fruits and vegetables, exercise regularly and maintain ideal body weight .continue Keppra for seizures and the current dosage. Check EEG for any silent seizure activity. I also advised the patient fall and safety precautions and advised her to use the walker at all times. Followup in the future with me in 6 months or call earlier if necessary Antony Contras, MD [facility] [address], [place], [place] [address] Ph: [phone] Fax: [phone]

## 2015-03-29 ENCOUNTER — Ambulatory Visit (INDEPENDENT_AMBULATORY_CARE_PROVIDER_SITE_OTHER): Payer: Medicare HMO | Admitting: Neurology

## 2015-03-29 DIAGNOSIS — G40209 Localization-related (focal) (partial) symptomatic epilepsy and epileptic syndromes with complex partial seizures, not intractable, without status epilepticus: Secondary | ICD-10-CM

## 2015-03-29 NOTE — Procedures (Signed)
    History:  April [name] is a 74 year old patient with a history of cerebrovascular disease. The patient has had some intermittent memory loss, mild confusion recently, in March 2016, the patient was noted to have an abnormal EEG, consistent with seizures, and was placed on Keppra. The patient is being evaluated for this issue.  This is a routine EEG. No skull defects are noted. Medications include aspirin, Lipitor, Dulcolax, Coreg, Lasix, Amaryl, insulin, Isordil, Keppra, Prilosec, Zofran, and potassium supplementation.   EEG classification: Normal awake  Description of the recording: The background rhythms of this recording consists of a fairly well modulated medium amplitude alpha rhythm of 9 Hz that is reactive to eye opening and closure. As the record progresses, the patient appears to remain in the waking state throughout the recording. Photic stimulation and hyperventilation were not performed. At no time during the recording does there appear to be evidence of spike or spike wave discharges or evidence of focal slowing. EKG monitor shows no evidence of cardiac rhythm abnormalities with a heart rate of 84.  Impression: This is a normal EEG recording in the waking state. No evidence of ictal or interictal discharges are seen.

## 2015-04-02 ENCOUNTER — Telehealth: Payer: Self-pay

## 2015-04-02 ENCOUNTER — Other Ambulatory Visit: Payer: Self-pay | Admitting: Gastroenterology

## 2015-04-02 NOTE — Telephone Encounter (Signed)
Rn gave results to patients niece [name]. Informed her that her aunt EEG was normal.

## 2015-04-03 ENCOUNTER — Encounter (HOSPITAL_COMMUNITY): Payer: Self-pay | Admitting: *Deleted

## 2015-04-05 NOTE — H&P (View-Only) (Signed)
PATIENT: April [name] DOB: 03/04/1941  HISTORICAL  April WESSELLS is a 74 years old female, referred by her primary care nurse practitioner Debbrah Alar, accompanied by her niece April [name], who is in this process of become her power of attorney, and her niece April [name] at today's evaluation,  She had past medical history of hypertension, hyperlipidemia, diabetes, most recent A1c was 10.7, she was admitted to the hospital March 5 to October 25 2014, after she was found by her neighbor that she is confused  Had extensive evaluation during her hospital stay, EEG October 23 2014 showed buildup of occipital spike and slow wave activity from the left  occipital region. It achieves a frequency of 3-4 Hz with some spread noted to the right hemisphere as well. These episodes last from 100 to 155 seconds with normal awake background  activity returning after these periods. Suggestive of subclinical seizure, she was put on Keppra 500 mg twice a day, repeat EEG October 25 2014, showed significant improvement  Echocardiogram ejection fraction 60%, ultrasound of carotid artery showed less than 39% stenosis, anterograde flow of bilateral vertebral system   I have reviewed MRI of brain, MRA of the brain October 21 2014, MRI of the brain October 23 2014: 5 mm focus of restricted diffusion within the inferior right thalamus as above, highly suspicious for a small acute ischemic  infarct. Generalized cerebral atrophy with moderate chronic microvascular ischemic disease. There was no significant medium large size vessel disease by MRA of the brain, Laboratory evaluation showed  LDL 46, A1c 10.7, normal vitamin B12, TSH,   Before she was admitted to the hospital, she lives by herself, has widowed for many years, has no relationship to her only son, was still driving, but he was noticed by her nieces, who intact with her frequently, she has gradual onset memory trouble, also moody, tends to cry, she has been  missing her diabetic, hypertension, hyperlipidemia medication regularly,   Since hospital admission, she was discharged to [facility] place, continue has intermittent memory loss, mild confusion, in addition, she was noted to have increased gait difficulty, urinary urgency, occasionally bladder accident, constipation, with laxative, she occasionally has bowel incontinence, contributed to not able to make to the bathroom by Asst. in timely fashion,   She is tearful emotional during today's interview, she will be moved to a long-term assisted living, her niece is in the process of getting power of attorney  Update 03/23/2015 : She is accompanied today by her daughter following her last visit with Dr. Krista Blue in March 2016. She reports overall significant improvement in her confusion and memory difficulties. She's had no recurrent stroke or TIA symptoms since my last hospital visit with her in March 2015 when she had a small right thalamic infarct. She also had a urinary tract infection at that time. Patient is now been diagnosed with a new colonic mass and plans to see GI surgeon Dr. Marcello Moores for consideration for surgery. She is presently living in assisted living and has recently started walking with a walker and is doing well. She is tolerating Keppra well without any side effects. She has had no recurrent seizure episodes. She states her blood pressure is normal and today it is slightly low at 103/67. Her fasting sugars have ranged consistently in the 90 and today was 93 mg percent on Mini-Mental status exam today she scored 29/30.Marland Kitchen REVIEW OF SYSTEMS: Full 14 system review of systems performed and notable only for memory loss, confusion,  activity change and difficulty walking all other systems negative  ALLERGIES: Allergies  Allergen Reactions  . Codeine Nausea And Vomiting  . Sulfa Antibiotics Nausea And Vomiting    HOME MEDICATIONS: Current Outpatient Prescriptions  Medication Sig Dispense Refill  .  amoxicillin-clavulanate (AUGMENTIN) 875-125 MG per tablet Take 1 tablet by mouth every 12 (twelve) hours. 14 tablet 0  . aspirin EC 325 MG tablet Take 1 tablet (325 mg total) by mouth daily. 30 tablet 0  . atorvastatin (LIPITOR) 20 MG tablet Take 1 tablet (20 mg total) by mouth daily at 6 PM.    . bisacodyl (DULCOLAX) 10 MG suppository Place 1 suppository (10 mg total) rectally daily. 12 suppository 0  . carvedilol (COREG) 3.125 MG tablet Take 1 tablet (3.125 mg total) by mouth 2 (two) times daily with a meal.    . furosemide (LASIX) 40 MG tablet Take 1 tablet daily. If you gain > 3lbs in 1 day or 5 lbs in 1 week, take an extra dose. 60 tablet   . glimepiride (AMARYL) 2 MG tablet Take 1 tablet (2 mg total) by mouth daily with breakfast.    . insulin detemir (LEVEMIR) 100 UNIT/ML injection Inject 0.1 mLs (10 Units total) into the skin daily. 10 mL 0  . isosorbide dinitrate (ISORDIL) 5 MG tablet Take 1 tablet (5 mg total) by mouth 3 (three) times daily. Take 1 tablet by mouth three times a day 30 tablet 2  . levETIRAcetam (KEPPRA) 500 MG tablet Take 1 tablet (500 mg total) by mouth 2 (two) times daily.    Marland Kitchen omeprazole (PRILOSEC) 20 MG capsule Take 20 mg by mouth daily.    . ondansetron (ZOFRAN ODT) 8 MG disintegrating tablet Take 1 tablet (8 mg total) by mouth every 8 (eight) hours as needed for nausea or vomiting. 20 tablet 0  . potassium chloride SA (K-DUR,KLOR-CON) 20 MEQ tablet Take 2 tablets (40 mEq total) by mouth daily. 30 tablet 0  . PROTEIN PO Take 2 scoop by mouth 2 (two) times daily. *Procell Protein Supplement*    . saccharomyces boulardii (FLORASTOR) 250 MG capsule Take 1 capsule (250 mg total) by mouth 2 (two) times daily. 30 capsule 0   No current facility-administered medications for this visit.    PAST MEDICAL HISTORY: Past Medical History  Diagnosis Date  . Diabetes mellitus   . Hypertension   . Retinopathy 06/06/2013    Per eye exam 05/31/13 eye exam, Moderate retinopathy     . Aphasia   . Seizures   . Rectum bleeding   . Stroke   . HYPERLIPIDEMIA 10/15/2006    Qualifier: Diagnosis of  By: Beryle Lathe    . GERD (gastroesophageal reflux disease) 05/06/2013  . Gram-positive cocci bacteremia   . Seizure disorder, status epilepticus, nonconvulsive   . Cerebral infarction due to unspecified mechanism   . Uncontrolled type 2 diabetes mellitus with insulin therapy 01/17/2015  . Chronic diastolic CHF (congestive heart failure), grade I 01/18/2015    PAST SURGICAL HISTORY: Past Surgical History  Procedure Laterality Date  . Appendectomy  1972  . Abdominal hysterectomy  1972  . Tee without cardioversion N/A 10/24/2014    Procedure: TRANSESOPHAGEAL ECHOCARDIOGRAM (TEE);  Surgeon: Dorothy Spark, MD;  Location: [place];  Service: Cardiovascular;  Laterality: N/A;  . Cystoscopy w/ ureteral stent placement Left 02/08/2015    Procedure: CYSTOSCOPY WITH RETROGRADE left PYELOGRAM/URETERAL STENT PLACEMENT left insertion of foley;  Surgeon: Franchot Gallo, MD;  Location: [facility];  Service: Urology;  Laterality: Left;    FAMILY HISTORY: Family History  Problem Relation Age of Onset  . Arthritis Maternal Grandmother   . Heart disease Maternal Grandmother Dec 06, 1978  . Diabetes Mother   . Stomach cancer Mother     SOCIAL HISTORY:  History   Social History  . Marital Status: Widowed    Spouse Name: N/A  . Number of Children: 1  . Years of Education: 13   Occupational History  . Retired    Social History Main Topics  . Smoking status: Never Smoker   . Smokeless tobacco: Never Used  . Alcohol Use: No  . Drug Use: No  . Sexual Activity: Not on file   Other Topics Concern  . Not on file   Social History Narrative   Widowed, husband died in 12/05/01.   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    Right-handed.   1 cup caffeine per day.   She is being cared for by her two neices.        PHYSICAL EXAM   Filed Vitals:   03/23/15 0832  BP: 103/67  Pulse: 88  Height: 4\' 11"  (1.499 m)  Weight: 114 lb 6.4 oz (51.891 kg)    Not recorded      Body mass index is 23.09 kg/(m^2).  PHYSICAL EXAMNIATION:  Gen: NAD, conversant, well nourised, obese, well groomed                     Cardiovascular: Regular rate rhythm, no peripheral edema, warm, nontender. Eyes: Conjunctivae clear without exudates or hemorrhage Neck: Supple, no carotid bruise. Pulmonary: Clear to auscultation bilaterally   NEUROLOGICAL EXAM:  MENTAL STATUS: Speech:    Speech is normal; fluent and spontaneous with normal comprehension.  Cognition:Mini-Mental Status Examination is 29out of 30 . Clock drawing 2/4    The patient is oriented to person, place, and time;     recent and remote memory intact;     language fluent;     normal attention, concentration,     fund of knowledge.  CRANIAL NERVES: CN II: Visual fields are full to confrontation. Fundoscopic exam is normal with sharp discs and no vascular changes. Venous pulsations are present bilaterally. Pupils are 4 mm and briskly reactive to light. Visual acuity is 20/20 bilaterally. CN III, IV, VI: extraocular movement are normal. No ptosis. CN V: Facial sensation is intact to pinprick in all 3 divisions bilaterally. Corneal responses are intact.  CN VII: Face is symmetric with normal eye closure and smile. CN VIII: Hearing is normal to rubbing fingers CN IX, X: Palate elevates symmetrically. Phonation is normal. CN XI: Head turning and shoulder shrug are intact CN XII: Tongue is midline with normal movements and no atrophy.  MOTOR: There is no pronator drift of out-stretched arms. Muscle bulk and tone are normal. Muscle strength is normal.   Shoulder abduction Shoulder external rotation Elbow flexion Elbow extension Wrist flexion Wrist extension  Finger abduction Hip flexion Knee flexion Knee extension Ankle dorsi flexion Ankle plantar flexion  R 5 5 5 5 5 5 5 5 5 5  4 5   L 5 5 5 5 5 5 5 5 5 5  4 5     REFLEXES: Reflexes are 2+ and symmetric at the biceps, triceps, knees, and ankles.  Plantar responses are flexor.  SENSORY: Bilateral lower extremity pitting edema, mildly length dependent decreased pinprick, vibratory sensation at toes,  Coordination : Rapid alternating movements and fine finger movements are intact. There is no dysmetria on finger-to-nose and heel-knee-shin. There are no abnormal or extraneous movements.   GAIT/STANCE: She needs assistance to get up from seated position walks with a wheeled walker. DIAGNOSTIC DATA (LABS, IMAGING, TESTING) - I reviewed patient records, labs, notes, testing and imaging myself where available.  Lab Results  Component Value Date   WBC 13.7* 02/13/2015   HGB 12.2 02/13/2015   HCT 37.5 02/13/2015   MCV 89.7 02/13/2015   PLT 363 02/13/2015      Component Value Date/Time   NA 141 02/13/2015 0453   K 3.1* 02/13/2015 0453   CL 103 02/13/2015 0453   CO2 30 02/13/2015 0453   GLUCOSE 112* 02/13/2015 0453   BUN 11 02/13/2015 0453   CREATININE 0.52 02/13/2015 0453   CREATININE 0.52 05/17/2013 1144   CALCIUM 8.6* 02/13/2015 0453   PROT 6.7 02/08/2015 0335   ALBUMIN 2.8* 02/08/2015 0335   AST 13* 02/08/2015 0335   ALT 12* 02/08/2015 0335   ALKPHOS 87 02/08/2015 0335   BILITOT 0.3 02/08/2015 0335   GFRNONAA >60 02/13/2015 0453   GFRAA >60 02/13/2015 0453   Lab Results  Component Value Date   CHOL 169 10/22/2014   HDL 28* 10/22/2014   LDLCALC 105* 10/22/2014   TRIG 178* 10/22/2014   CHOLHDL 6.0 10/22/2014   Lab Results  Component Value Date   HGBA1C 7.4* 01/16/2015   Lab Results  Component Value Date   VITAMINB12 605 10/22/2014   Lab Results  Component Value Date   TSH 1.533 10/22/2014      ASSESSMENT AND PLAN  Laurelai Lepp Hagerty is a 74 y.o. 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 I also advised the patient to eat a healthy diet with plenty of whole grains, cereals, fruits and vegetables, exercise regularly and maintain ideal body weight .continue Keppra for seizures and the current dosage. Check EEG for any silent seizure activity. I also advised the patient fall and safety precautions and advised her to use the walker at all times. Followup in the future with me in 6 months or call earlier if necessary Antony Contras, MD [facility] [address] [place], [place] [address] Ph: [phone] Fax: [phone]

## 2015-04-05 NOTE — Interval H&P Note (Signed)
History and Physical Interval Note:  04/05/2015 12:05 PM  April [name]  has presented today for surgery, with the diagnosis of abnormal CT scan  The various methods of treatment have been discussed with the patient and family. After consideration of risks, benefits and other options for treatment, the patient has consented to  Procedure(s): COLONOSCOPY WITH PROPOFOL (N/A) as a surgical intervention .  The patient's history has been reviewed, patient examined, no change in status, stable for surgery.  I have reviewed the patient's chart and labs.  Questions were answered to the patient's satisfaction.     Khilynn Borntreger D

## 2015-04-06 ENCOUNTER — Ambulatory Visit (HOSPITAL_COMMUNITY): Payer: Medicare HMO | Admitting: Anesthesiology

## 2015-04-06 ENCOUNTER — Ambulatory Visit (HOSPITAL_COMMUNITY)
Admission: RE | Admit: 2015-04-06 | Discharge: 2015-04-06 | Disposition: A | Discharge status: Skilled Nursing Facility | Payer: Medicare HMO | Source: Ambulatory Visit | Attending: Gastroenterology | Admitting: Gastroenterology

## 2015-04-06 ENCOUNTER — Encounter (HOSPITAL_COMMUNITY): Payer: Self-pay | Admitting: *Deleted

## 2015-04-06 ENCOUNTER — Ambulatory Visit: Payer: Self-pay | Admitting: Neurology

## 2015-04-06 ENCOUNTER — Encounter (HOSPITAL_COMMUNITY)
Admission: RE | Disposition: A | Discharge status: Skilled Nursing Facility | Payer: Self-pay | Source: Ambulatory Visit | Attending: Gastroenterology

## 2015-04-06 DIAGNOSIS — I5032 Chronic diastolic (congestive) heart failure: Secondary | ICD-10-CM | POA: Insufficient documentation

## 2015-04-06 DIAGNOSIS — K639 Disease of intestine, unspecified: Secondary | ICD-10-CM | POA: Diagnosis present

## 2015-04-06 DIAGNOSIS — Z794 Long term (current) use of insulin: Secondary | ICD-10-CM | POA: Diagnosis not present

## 2015-04-06 DIAGNOSIS — Z7982 Long term (current) use of aspirin: Secondary | ICD-10-CM | POA: Diagnosis not present

## 2015-04-06 DIAGNOSIS — E1151 Type 2 diabetes mellitus with diabetic peripheral angiopathy without gangrene: Secondary | ICD-10-CM | POA: Diagnosis not present

## 2015-04-06 DIAGNOSIS — E785 Hyperlipidemia, unspecified: Secondary | ICD-10-CM | POA: Diagnosis not present

## 2015-04-06 DIAGNOSIS — Z8673 Personal history of transient ischemic attack (TIA), and cerebral infarction without residual deficits: Secondary | ICD-10-CM | POA: Insufficient documentation

## 2015-04-06 DIAGNOSIS — R569 Unspecified convulsions: Secondary | ICD-10-CM | POA: Insufficient documentation

## 2015-04-06 DIAGNOSIS — C172 Malignant neoplasm of ileum: Secondary | ICD-10-CM | POA: Insufficient documentation

## 2015-04-06 DIAGNOSIS — D123 Benign neoplasm of transverse colon: Secondary | ICD-10-CM | POA: Insufficient documentation

## 2015-04-06 DIAGNOSIS — D124 Benign neoplasm of descending colon: Secondary | ICD-10-CM | POA: Insufficient documentation

## 2015-04-06 DIAGNOSIS — Z79899 Other long term (current) drug therapy: Secondary | ICD-10-CM | POA: Insufficient documentation

## 2015-04-06 DIAGNOSIS — E11339 Type 2 diabetes mellitus with moderate nonproliferative diabetic retinopathy without macular edema: Secondary | ICD-10-CM | POA: Insufficient documentation

## 2015-04-06 DIAGNOSIS — K219 Gastro-esophageal reflux disease without esophagitis: Secondary | ICD-10-CM | POA: Diagnosis not present

## 2015-04-06 DIAGNOSIS — I10 Essential (primary) hypertension: Secondary | ICD-10-CM | POA: Insufficient documentation

## 2015-04-06 HISTORY — PX: COLONOSCOPY WITH PROPOFOL: SHX5780

## 2015-04-06 LAB — GLUCOSE, CAPILLARY: Glucose-Capillary: 114 mg/dL — ABNORMAL HIGH (ref 65–99)

## 2015-04-06 SURGERY — COLONOSCOPY WITH PROPOFOL
Anesthesia: Monitor Anesthesia Care

## 2015-04-06 MED ORDER — LIDOCAINE HCL (CARDIAC) 20 MG/ML IV SOLN
INTRAVENOUS | Status: AC
  Filled 2015-04-06: qty 5

## 2015-04-06 MED ORDER — PROPOFOL 10 MG/ML IV BOLUS
INTRAVENOUS | Status: AC
  Filled 2015-04-06: qty 20

## 2015-04-06 MED ORDER — PROPOFOL INFUSION 10 MG/ML OPTIME
INTRAVENOUS | Status: DC | PRN
Start: 2015-04-06 — End: 2015-04-06
  Administered 2015-04-06: 100 ug/kg/min via INTRAVENOUS

## 2015-04-06 MED ORDER — LIDOCAINE HCL (CARDIAC) 20 MG/ML IV SOLN
INTRAVENOUS | Status: DC | PRN
  Administered 2015-04-06: 50 mg via INTRAVENOUS

## 2015-04-06 MED ORDER — SODIUM CHLORIDE 0.9 % IV SOLN: INTRAVENOUS | Status: DC

## 2015-04-06 MED ORDER — PHENYLEPHRINE 40 MCG/ML (10ML) SYRINGE FOR IV PUSH (FOR BLOOD PRESSURE SUPPORT)
PREFILLED_SYRINGE | INTRAVENOUS | Status: AC
  Filled 2015-04-06: qty 10

## 2015-04-06 MED ORDER — LACTATED RINGERS IV SOLN
INTRAVENOUS | Status: DC
  Administered 2015-04-06: 1000 mL via INTRAVENOUS

## 2015-04-06 MED ORDER — PHENYLEPHRINE HCL 10 MG/ML IJ SOLN
INTRAMUSCULAR | Status: DC | PRN
  Administered 2015-04-06: 40 ug via INTRAVENOUS

## 2015-04-06 MED ORDER — PROPOFOL 10 MG/ML IV BOLUS
INTRAVENOUS | Status: DC | PRN
  Administered 2015-04-06 (×3): 10 mg via INTRAVENOUS
  Administered 2015-04-06: 30 mg via INTRAVENOUS
  Administered 2015-04-06: 20 mg via INTRAVENOUS

## 2015-04-06 SURGICAL SUPPLY — 21 items

## 2015-04-06 NOTE — Op Note (Signed)
[facility] [place], [address]   COLONOSCOPY PROCEDURE REPORT  PATIENT: April, [name]  MR#: [id number] BIRTHDATE: 01/14/1941 , 89  yrs. old GENDER: female ENDOSCOPIST: Carol Ada, MD REFERRED BY: PROCEDURE DATE:  05-04-2015 PROCEDURE:   Colonoscopy with snare polypectomy and cold biopsies ASA CLASS:   Class III INDICATIONS:Abnormal CT scan MEDICATIONS: MAC  DESCRIPTION OF PROCEDURE:   After the risks and benefits and of the procedure were explained, informed consent was obtained.  revealed no abnormalities of the rectum.    The Pentax Ped Colon [id number] endoscope was introduced through the anus and advanced to the cecum, which was identified by both the appendix and ileocecal valve .  The quality of the prep was excellent. .  The instrument was then slowly withdrawn as the colon was fully examined. Estimated blood loss is zero unless otherwise noted in this procedure report.     FINDINGS:  At hte IC valve there was evidence of some growth.  This correlates with the findings on the CT sacn.  The TI was not able to be intubated as a result of the lesion, but multiple cold biopsies were obtained.  A total of 15 polyps ranging from 3-5 mm were identified in the transvers, descending, and sigmoid colon. All the polyps were removed with a cold snare with the exception of the last distal sigmoid colon polyp.  It was pedunculated and a hot snare cautery was employed.     Retroflexed views revealed no abnormalities.     The scope was then withdrawn from the patient and the procedure completed.  WITHDRAWAL TIME: 23 minutes  COMPLICATIONS: There were no immediate complications. ENDOSCOPIC IMPRESSION: 1) TI mass s/p biopsies. 2) Polyps. RECOMMENDATIONS: 1) Follow up biopsy results. 2) The decision for the repeat colonoscopy will be based on the biopsies and surgical findings of the TI/right mesentery mass.   REPEAT  EXAM:  cc:  _______________________________ eSignedCarol Ada, MD 05/04/15 9:00 AM   CPT CODES: ICD CODES:  The ICD and CPT codes recommended by this software are interpretations from the data that the clinical staff has captured with the software.  The verification of the translation of this report to the ICD and CPT codes and modifiers is the sole responsibility of the health care institution and practicing physician where this report was generated.  [place]. will not be held responsible for the validity of the ICD and CPT codes included on this report.  AMA assumes no liability for data contained or not contained herein. CPT is a Designer, television/film set of the [employer].   PATIENT NAME:  April, [name] MR#: [id number]

## 2015-04-06 NOTE — Progress Notes (Signed)
Post procedure BP running consistently 400'Q - 676 Systolic. Reviewed with Dr. Benson Norway; ok to discharge to nsg facility. Pt. Had received IV vasopressors during procedure.

## 2015-04-06 NOTE — Anesthesia Preprocedure Evaluation (Addendum)
Anesthesia Evaluation  Patient identified by MRN, date of birth, ID band Patient awake    Reviewed: Allergy & Precautions, NPO status , Patient's Chart, lab work & pertinent test results, reviewed documented beta blocker date and time   Airway Mallampati: II  TM Distance: >3 FB Neck ROM: Full    Dental  (+) Edentulous Upper, Edentulous Lower, Upper Dentures, Lower Dentures   Pulmonary neg pulmonary ROS,  breath sounds clear to auscultation  Pulmonary exam normal       Cardiovascular Exercise Tolerance: Good hypertension, Pt. on medications and Pt. on home beta blockers + Peripheral Vascular Disease and +CHF Normal cardiovascular examRhythm:Regular Rate:Normal  H/O CHF although patient denies  She states her breathing is at baseline today. 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 CVA, Residual Symptoms    GI/Hepatic Neg liver ROS, GERD-  Medicated,  Endo/Other  negative endocrine ROSdiabetes, Type 2, Insulin Dependent  Renal/GU Renal disease  negative genitourinary   Musculoskeletal negative musculoskeletal ROS (+)   Abdominal   Peds negative pediatric ROS (+)  Hematology negative hematology ROS (+)   Anesthesia Other Findings   Reproductive/Obstetrics negative OB ROS                           Anesthesia Physical  Anesthesia Plan  ASA: III  Anesthesia Plan: MAC   Post-op Pain Management:    Induction: Intravenous  Airway Management Planned: Nasal Cannula  Additional Equipment:   Intra-op Plan:   Post-operative Plan: Extubation in OR  Informed Consent: I have reviewed the patients History and Physical, chart, labs and discussed the procedure including the risks, benefits and alternatives for the proposed anesthesia  with the patient or authorized representative who has indicated his/her understanding and acceptance.   Dental advisory given  Plan Discussed with: CRNA  Anesthesia Plan Comments: (Discussed risks/benefits/alternatives to MAC sedation including need for ventilatory support, hypotension, need for conversion to general anesthesia.  All patient questions answered.  Patient wished to proceed.)        Anesthesia Quick Evaluation

## 2015-04-06 NOTE — Transfer of Care (Signed)
Immediate Anesthesia Transfer of Care Note  Patient: April [name]  Procedure(s) Performed: Procedure(s): COLONOSCOPY WITH PROPOFOL (N/A)  Patient Location: PACU, endo recovery  Anesthesia Type:MAC  Level of Consciousness: Patient easily awoken, sedated, comfortable, cooperative, following commands, responds to stimulation.   Airway & Oxygen Therapy: Patient spontaneously breathing, ventilating well, oxygen via simple oxygen mask.  Post-op Assessment: Report given to PACU RN, vital signs reviewed and stable, moving all extremities.   Post vital signs: Reviewed and stable.  Complications: No apparent anesthesia complications

## 2015-04-06 NOTE — Discharge Instructions (Addendum)
Colonoscopy, Care After °These instructions give you information on caring for yourself after your procedure. Your doctor may also give you more specific instructions. Call your doctor if you have any problems or questions after your procedure. °HOME CARE °· Do not drive for 24 hours. °· Do not sign important papers or use machinery for 24 hours. °· You may shower. °· You may go back to your usual activities, but go slower for the first 24 hours. °· Take rest breaks often during the first 24 hours. °· Walk around or use warm packs on your belly (abdomen) if you have belly cramping or gas. °· Drink enough fluids to keep your pee (urine) clear or pale yellow. °· Resume your normal diet. Avoid heavy or fried foods. °· Avoid drinking alcohol for 24 hours or as told by your doctor. °· Only take medicines as told by your doctor. °If a tissue sample (biopsy) was taken during the procedure:  °· Do not take aspirin or blood thinners for 7 days, or as told by your doctor. °· Do not drink alcohol for 7 days, or as told by your doctor. °· Eat soft foods for the first 24 hours. °GET HELP IF: °You still have a small amount of blood in your poop (stool) 2-3 days after the procedure. °GET HELP RIGHT AWAY IF: °· You have more than a small amount of blood in your poop. °· You see clumps of tissue (blood clots) in your poop. °· Your belly is puffy (swollen). °· You feel sick to your stomach (nauseous) or throw up (vomit). °· You have a fever. °· You have belly pain that gets worse and medicine does not help. °MAKE SURE YOU: °· Understand these instructions. °· Will watch your condition. °· Will get help right away if you are not doing well or get worse. °Document Released: 09/06/2010 Document Revised: 08/09/2013 Document Reviewed: 04/11/2013 °[employer]® Patient Information ©2015 [employer]. This information is not intended to replace advice given to you by your health care provider. Make sure you discuss any questions you have with  your health care provider. ° °

## 2015-04-06 NOTE — Anesthesia Postprocedure Evaluation (Signed)
  Anesthesia Post-op Note  Patient: April [name]  Procedure(s) Performed: Procedure(s) (LRB): COLONOSCOPY WITH PROPOFOL (N/A)  Patient Location: PACU  Anesthesia Type: MAC  Level of Consciousness: awake and alert   Airway and Oxygen Therapy: Patient Spontanous Breathing  Post-op Pain: mild  Post-op Assessment: Post-op Vital signs reviewed, Patient's Cardiovascular Status Stable, Respiratory Function Stable, Patent Airway and No signs of Nausea or vomiting  Last Vitals:  Filed Vitals:   04/06/15 0945  BP: 201/98  Pulse: 81  Temp:   Resp: 15    Post-op Vital Signs: stable   Complications: No apparent anesthesia complications

## 2015-04-09 ENCOUNTER — Encounter (HOSPITAL_COMMUNITY): Payer: Self-pay | Admitting: Gastroenterology

## 2015-04-10 ENCOUNTER — Telehealth: Payer: Self-pay | Admitting: *Deleted

## 2015-04-10 NOTE — Telephone Encounter (Signed)
Received fax from [facility] requesting surgical clearance for mesenteric mass under general anesthesia. Pt on aspirin and they want to know how pt should HOLD aspirin preoperatively.  Pt has not been seen by our office since 2014 and has upcoming appt with Dorothyann Peng, NP in November. Spoke with pt's niece and she states she was told that pt had to see Dr Reymundo Poll while staying at [facility].  Spoke with nursing asst at [phone] to determine if Dr Fredderick Phenix will obtain clearance. She asked that I call them after 11am and speak with Tamera Stands, RN.

## 2015-04-10 NOTE — Telephone Encounter (Signed)
Called facility below, was unable to speak with [facility]. Spoke with Surveyor, quantity, Dusty and she states that Dr Tripp's practice has dissolved and some of his associates are supposed to be assuming care of their residents but have not as of yet.  She states she will have Shawna return my call.

## 2015-04-13 ENCOUNTER — Ambulatory Visit: Payer: Medicare HMO | Admitting: Adult Health

## 2015-04-16 ENCOUNTER — Other Ambulatory Visit: Payer: Self-pay | Admitting: General Surgery

## 2015-04-16 NOTE — H&P (Signed)
April [name] 04/16/2015 11:09 AM Location: [facility] Patient #: [id number] DOB: Dec 22, 1940 Widowed / Language: Cleophus Molt / Race: White Female History of Present Illness [name] MD; 02/14/5283 11:43 AM) The patient is a 74 year old female who presents with an abdominal mass. 74 year old female recently hospitalized due to an obstructing kidney stone and sepsis. CT scan obtained in the hospital showed a right-sided mesenteric mass concerning for malignancy or metastatic disease. She underwent a colonoscopy with Dr. Benson Norway and was found to have a terminal ileal mass. Biopsy showed neuroendocrine tumor. She is also having problems with her constipation and occasional rectal bleeding. She is taking Dulcolax on a daily basis as well as Senokot. Patient has a diagnosis of congestive heart failure but does not appear to be followed by any cardiologist. Patient also has a history of seizure disorder and CVA as well and currently resides in an assisted living facility. 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 Kidney Stone MESENTERIC MASS [phone]) Diabetes Mellitus Cerebrovascular Accident Bladder Problems  Past Surgical History [name], MD; 2/59/5638 12:27 PM) Hysterectomy (not due to cancer) - Partial Foot Surgery Left. Tonsillectomy Cataract Surgery Bilateral. Appendectomy  Diagnostic Studies History [name], MD; 7/56/4332 12:27 PM) Mammogram >3 years ago Colonoscopy 5-10 years ago Pap Smear 1-5 years ago  Allergies Elbert Ewings, CMA; 04/16/2015 11:09 AM) Codeine Phosphate *ANALGESICS - OPIOID* Nausea, Vomiting. Sulfabenzamide *CHEMICALS* Nausea, Vomiting.  Medication History  [name], MD; 9/51/8841 12:27 PM) Aspirin (325MG  Tablet, Oral daily) Active. Atorvastatin Calcium (10MG  Tablet, Oral) Active. (Unknown mg dosage) Furosemide (40MG  Tablet, Oral daily) Active. Glimepiride (2MG  Tablet, Oral qam) Active. Omeprazole (10MG  Capsule DR, Oral qam) Active. Klor-Con 10 (10MEQ Tablet ER, Oral qam) Active. Beneprotein (2 scoops Oral bid) Active. Carvedilol (25MG  Tablet, Oral bid) Active. Ondansetron (8MG  Tablet, Oral q 8 hours) Active. (prn vomiting) Florastor (250MG  Capsule, Oral bid) Active. LevETIRAcetam ER (500MG  Tablet ER 24HR, Oral daily) Active. Medications Reconciled Neomycin Sulfate (500MG  Tablet, 2 (two) Tablet Oral SEE NOTE, Taken starting 04/16/2015) Active. (TAKE TWO TABLETS AT 2 PM, 3 PM, AND 10 PM THE DAY PRIOR TO SURGERY) Flagyl (500MG  Tablet, 2 (two) Tablet Oral SEE NOTE, Taken starting 04/16/2015) Active. (Take at 2pm, 3pm, and 10pm the day prior to your colon operation)  Social History [name], MD; 6/60/6301 12:27 PM) Caffeine use Carbonated beverages, Coffee, Tea. No alcohol use Tobacco use Never smoker. No drug use  Family History [name], MD; 01/17/931 12:27 PM) Anesthetic complications Family Members In General. Alcohol Abuse Sister. Migraine Headache Sister. Kidney Disease Sister. Respiratory Condition Sister. Cancer Brother, Mother, Sister. Arthritis Brother, Mother, Sister. Diabetes Mellitus Brother, Family Members In General, Mother, Sister. Hypertension Sister. Heart Disease Mother.  Pregnancy / Birth History [name], MD; 3/55/7322 12:27 PM) Regular periods Contraceptive History Oral contraceptives. Age at menarche 12 years. Gravida 1 Para 1 Maternal age 13-20    Vitals Elbert Ewings CMA; 04/16/2015 11:10 AM) 04/16/2015 11:10 AM Weight: 116 lb Height: 59in Body Surface Area: 1.48 m Body Mass Index: 23.43 kg/m Temp.: 97.10F(Temporal)  Pulse: 80  (Regular)  BP: 128/68 (Sitting, Left Arm, Standard)     Physical Exam [name] MD; 0/25/4270 12:27 PM)  General Mental Status-Alert. General Appearance-Consistent with stated age. Hydration-Well hydrated. Voice-Normal.  Head and Neck Head-normocephalic, atraumatic with no lesions  or palpable masses. Trachea-midline. Thyroid Gland Characteristics - normal size and consistency.  Eye Eyeball - Bilateral-Extraocular movements intact. Sclera/Conjunctiva - Bilateral-No scleral icterus.  Chest and Lung Exam Chest and lung exam reveals -quiet, even and easy respiratory effort with no use of accessory muscles and on auscultation, normal breath sounds, no adventitious sounds and normal vocal resonance. Inspection Chest Wall - Normal. Back - normal.  Cardiovascular Cardiovascular examination reveals -normal heart sounds, regular rate and rhythm with no murmurs and normal pedal pulses bilaterally.  Abdomen Inspection Inspection of the abdomen reveals - No Hernias. Palpation/Percussion Palpation and Percussion of the abdomen reveal - Soft, Non Tender, No Rebound tenderness, No Rigidity (guarding) and No hepatosplenomegaly. Auscultation Auscultation of the abdomen reveals - Bowel sounds normal.  Neurologic Neurologic evaluation reveals -alert and oriented x 3 with no impairment of recent or remote memory. Mental Status-Normal.  Musculoskeletal Global Assessment -Note:no gross deformities.  Normal Exam - Left-Upper Extremity Strength Normal and Lower Extremity Strength Normal. Normal Exam - Right-Upper Extremity Strength Normal and Lower Extremity Strength Normal.    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 Colonoscopy shows a neuroendocrine tumor via biopsy. CT scan shows some calcified mesenteric nodules  consistent with metastatic disease. There are no obvious signs of liver metastases. I have recommended excision of her right colon and terminal ileum. We have discussed this in detail including laparoscopic and robotic techniques. We discussed risk and benefits of surgery as well. The surgery and anatomy were described to the patient as well as the risks of surgery and the possible complications. These include: Bleeding, deep abdominal infections and possible wound complications such as hernia and infection, damage to adjacent structures, leak of surgical connections, which can lead to other surgeries and possibly an ostomy, possible need for other procedures, such as abscess drains in radiology, possible prolonged hospital stay, possible diarrhea from removal of part of the colon, possible constipation from narcotics, possible bowel, prolonged fatigue/weakness or appetite loss, possible early recurrence of of disease, possible complications of their medical problems such as heart disease or arrhythmias or lung problems, death (less than 1%). I believe the patient understands and wishes to proceed with the surgery.

## 2015-04-17 NOTE — Telephone Encounter (Signed)
Left message for pt's niece to return my call. Have not been able to verify with facility if house Providers can complete surgical clearance.  Looks like pt will need an office visit to complete clearance.

## 2015-04-17 NOTE — Telephone Encounter (Signed)
Left message at facility to have [facility] return my call.

## 2015-04-18 NOTE — Telephone Encounter (Signed)
Spoke with pt's niece, Caren Griffins and advised her I have been unable to get a response from facility where pt is staying re: surgical clearance. She states she will continue to contact them to determine if they can provide the clearance needed. If they are unable to, I have advised her to call and schedule appt with Debbrah Alar, NP for clearance and ok to keep new pt appt with Dorothyann Peng, NP as this will be closer for pt once she is discharged from her current facility.

## 2015-04-18 NOTE — Telephone Encounter (Signed)
Per verbal from [place] (Primary school teacher), pt's niece called back and states she has gotten everything worked out and we can discard surgical clearance request. Forms shredded.

## 2015-04-22 ENCOUNTER — Encounter (HOSPITAL_COMMUNITY): Payer: Self-pay | Admitting: *Deleted

## 2015-04-22 ENCOUNTER — Emergency Department (HOSPITAL_COMMUNITY): Payer: Medicare HMO

## 2015-04-22 ENCOUNTER — Emergency Department (HOSPITAL_COMMUNITY)
Admission: EM | Admit: 2015-04-22 | Discharge: 2015-04-22 | Disposition: A | Discharge status: Home / Self Care | Payer: Medicare HMO | Attending: Emergency Medicine | Admitting: Emergency Medicine

## 2015-04-22 DIAGNOSIS — Z7982 Long term (current) use of aspirin: Secondary | ICD-10-CM | POA: Insufficient documentation

## 2015-04-22 DIAGNOSIS — E119 Type 2 diabetes mellitus without complications: Secondary | ICD-10-CM | POA: Diagnosis not present

## 2015-04-22 DIAGNOSIS — M549 Dorsalgia, unspecified: Secondary | ICD-10-CM | POA: Insufficient documentation

## 2015-04-22 DIAGNOSIS — Z8673 Personal history of transient ischemic attack (TIA), and cerebral infarction without residual deficits: Secondary | ICD-10-CM | POA: Insufficient documentation

## 2015-04-22 DIAGNOSIS — Z794 Long term (current) use of insulin: Secondary | ICD-10-CM | POA: Insufficient documentation

## 2015-04-22 DIAGNOSIS — G40309 Generalized idiopathic epilepsy and epileptic syndromes, not intractable, without status epilepticus: Secondary | ICD-10-CM | POA: Insufficient documentation

## 2015-04-22 DIAGNOSIS — R109 Unspecified abdominal pain: Secondary | ICD-10-CM

## 2015-04-22 DIAGNOSIS — Z79899 Other long term (current) drug therapy: Secondary | ICD-10-CM | POA: Insufficient documentation

## 2015-04-22 DIAGNOSIS — I10 Essential (primary) hypertension: Secondary | ICD-10-CM | POA: Diagnosis not present

## 2015-04-22 DIAGNOSIS — K219 Gastro-esophageal reflux disease without esophagitis: Secondary | ICD-10-CM | POA: Diagnosis not present

## 2015-04-22 DIAGNOSIS — I5032 Chronic diastolic (congestive) heart failure: Secondary | ICD-10-CM | POA: Insufficient documentation

## 2015-04-22 DIAGNOSIS — E785 Hyperlipidemia, unspecified: Secondary | ICD-10-CM | POA: Diagnosis not present

## 2015-04-22 LAB — CBC WITH DIFFERENTIAL/PLATELET
BASOS ABS: 0.1 10*3/uL (ref 0.0–0.1)
BASOS PCT: 1 % (ref 0–1)
Eosinophils Absolute: 0.3 10*3/uL (ref 0.0–0.7)
Eosinophils Relative: 3 % (ref 0–5)
HEMATOCRIT: 34.2 % — AB (ref 36.0–46.0)
HEMOGLOBIN: 11 g/dL — AB (ref 12.0–15.0)
LYMPHS PCT: 46 % (ref 12–46)
Lymphs Abs: 5.4 10*3/uL — ABNORMAL HIGH (ref 0.7–4.0)
MCH: 29 pg (ref 26.0–34.0)
MCHC: 32.2 g/dL (ref 30.0–36.0)
MCV: 90.2 fL (ref 78.0–100.0)
MONO ABS: 1.4 10*3/uL — AB (ref 0.1–1.0)
Monocytes Relative: 13 % — ABNORMAL HIGH (ref 3–12)
NEUTROS ABS: 4.2 10*3/uL (ref 1.7–7.7)
NEUTROS PCT: 37 % — AB (ref 43–77)
Platelets: 390 10*3/uL (ref 150–400)
RBC: 3.79 MIL/uL — ABNORMAL LOW (ref 3.87–5.11)
RDW: 14.9 % (ref 11.5–15.5)
WBC: 11.4 10*3/uL — ABNORMAL HIGH (ref 4.0–10.5)

## 2015-04-22 LAB — I-STAT CHEM 8, ED
BUN: 19 mg/dL (ref 6–20)
CHLORIDE: 101 mmol/L (ref 101–111)
CREATININE: 0.7 mg/dL (ref 0.44–1.00)
Calcium, Ion: 1.04 mmol/L — ABNORMAL LOW (ref 1.13–1.30)
GLUCOSE: 78 mg/dL (ref 65–99)
HEMATOCRIT: 34 % — AB (ref 36.0–46.0)
Hemoglobin: 11.6 g/dL — ABNORMAL LOW (ref 12.0–15.0)
POTASSIUM: 3.8 mmol/L (ref 3.5–5.1)
Sodium: 138 mmol/L (ref 135–145)
TCO2: 28 mmol/L (ref 0–100)

## 2015-04-22 LAB — URINALYSIS, ROUTINE W REFLEX MICROSCOPIC
Bilirubin Urine: NEGATIVE
GLUCOSE, UA: NEGATIVE mg/dL
KETONES UR: NEGATIVE mg/dL
NITRITE: POSITIVE — AB
PH: 7 (ref 5.0–8.0)
Protein, ur: NEGATIVE mg/dL
SPECIFIC GRAVITY, URINE: 1.006 (ref 1.005–1.030)
Urobilinogen, UA: 0.2 mg/dL (ref 0.0–1.0)

## 2015-04-22 LAB — URINE MICROSCOPIC-ADD ON

## 2015-04-22 MED ORDER — SODIUM CHLORIDE 0.9 % IV SOLN
Freq: Once | INTRAVENOUS | Status: AC
  Administered 2015-04-22: 21:00:00 via INTRAVENOUS

## 2015-04-22 MED ORDER — METHOCARBAMOL 500 MG PO TABS: 500.0000 mg | ORAL_TABLET | Freq: Once | ORAL | Status: DC

## 2015-04-22 MED ORDER — METHOCARBAMOL 500 MG PO TABS
500.0000 mg | ORAL_TABLET | Freq: Once | ORAL | Status: AC
  Administered 2015-04-22: 500 mg via ORAL
  Filled 2015-04-22: qty 1

## 2015-04-22 MED ORDER — CARVEDILOL 3.125 MG PO TABS
3.2500 mg | ORAL_TABLET | Freq: Two times a day (BID) | ORAL | Status: DC
  Administered 2015-04-22: 3.125 mg via ORAL
  Filled 2015-04-22: qty 1

## 2015-04-22 MED ORDER — KETOROLAC TROMETHAMINE 30 MG/ML IJ SOLN
30.0000 mg | Freq: Once | INTRAMUSCULAR | Status: AC
  Administered 2015-04-22: 30 mg via INTRAVENOUS
  Filled 2015-04-22: qty 1

## 2015-04-22 NOTE — ED Notes (Signed)
Pt discharged with family to facility.

## 2015-04-22 NOTE — ED Notes (Signed)
Patient transported to CT 

## 2015-04-22 NOTE — ED Notes (Signed)
Pt began having pain in the small of her back about four days ago.  As it has progressed, she began having increasing pain when attempting to get out of chair Pain began making it difficult for her to walk (she uses a walker).  Now it is to a point where she hasn't doesn't get out of bed at all due to the pain. She has a catheter placed by Dr. Diona Fanti a week ago because she has "a lazy bladder".  (patient's description of problem is very disorganized And hard to follow).    Pain is currently in her lower back and radiating into her buttocks.  She states she has been doing nothing but sleep for the last two days.

## 2015-04-22 NOTE — ED Provider Notes (Signed)
CSN: [id number]     Arrival date & time 04/22/15  1820 History   First MD Initiated Contact with Patient 04/22/15 [place]     Chief Complaint  Patient presents with  . Flank Pain    kidney stone  . Back Pain     (Consider location/radiation/quality/duration/timing/severity/associated sxs/prior Treatment) Patient is a 74 y.o. female presenting with flank pain and back pain. The history is provided by the patient.  Flank Pain This is a recurrent problem. The current episode started today. The problem occurs constantly. The problem has been gradually worsening. Pertinent negatives include no fever or rash. Nothing aggravates the symptoms. She has tried nothing for the symptoms. The treatment provided no relief.  Back Pain Associated symptoms: no dysuria and no fever     Past Medical History  Diagnosis Date  . Diabetes mellitus   . Hypertension   . Retinopathy 06/06/2013    Per eye exam 05/31/13 eye exam, Moderate retinopathy   . Aphasia   . Seizures   . Rectum bleeding   . Stroke   . HYPERLIPIDEMIA 10/15/2006    Qualifier: Diagnosis of  By: Beryle Lathe    . GERD (gastroesophageal reflux disease) 05/06/2013  . Gram-positive cocci bacteremia   . Seizure disorder, status epilepticus, nonconvulsive   . Cerebral infarction due to unspecified mechanism   . Uncontrolled type 2 diabetes mellitus with insulin therapy 01/17/2015  . Chronic diastolic CHF (congestive heart failure), grade I 01/18/2015   Past Surgical History  Procedure Laterality Date  . Appendectomy  1972  . Abdominal hysterectomy  1972  . Tee without cardioversion N/A 10/24/2014    Procedure: TRANSESOPHAGEAL ECHOCARDIOGRAM (TEE);  Surgeon: Dorothy Spark, MD;  Location: [facility];  Service: Cardiovascular;  Laterality: N/A;  . Cystoscopy w/ ureteral stent placement Left 02/08/2015    Procedure: CYSTOSCOPY WITH RETROGRADE left PYELOGRAM/URETERAL STENT PLACEMENT left insertion of foley;  Surgeon: Franchot Gallo, MD;   Location: [facility];  Service: Urology;  Laterality: Left;  . Colonoscopy with propofol N/A 04/06/2015    Procedure: COLONOSCOPY WITH PROPOFOL;  Surgeon: Carol Ada, MD;  Location: [facility];  Service: Endoscopy;  Laterality: N/A;   Family History  Problem Relation Age of Onset  . Arthritis Maternal Grandmother   . Heart disease Maternal Grandmother 80  . Diabetes Mother   . Stomach cancer Mother    Social History  Substance Use Topics  . Smoking status: Never Smoker   . Smokeless tobacco: Never Used  . Alcohol Use: No   OB History    No data available     Review of Systems  Constitutional: Negative for fever.  Genitourinary: Positive for flank pain. Negative for dysuria and hematuria.  Musculoskeletal: Positive for back pain.  Skin: Negative for rash and wound.  All other systems reviewed and are negative.     Allergies  Codeine and Sulfa antibiotics  Home Medications   Prior to Admission medications   Medication Sig Start Date End Date Taking? Authorizing Provider  aspirin EC 325 MG tablet Take 1 tablet (325 mg total) by mouth daily. 10/25/14  Yes Barton Dubois, MD  atorvastatin (LIPITOR) 20 MG tablet Take 1 tablet (20 mg total) by mouth daily at 6 PM. 10/25/14  Yes Barton Dubois, MD  bisacodyl (DULCOLAX) 10 MG suppository Place 1 suppository (10 mg total) rectally daily. Patient taking differently: Place 10 mg rectally at bedtime.  02/13/15  Yes Donne Hazel, MD  carvedilol (COREG) 3.125 MG tablet Take 1 tablet (3.125  mg total) by mouth 2 (two) times daily with a meal. 01/18/15  Yes Christina P Rama, MD  furosemide (LASIX) 40 MG tablet Take 1 tablet daily. If you gain > 3lbs in 1 day or 5 lbs in 1 week, take an extra dose. 01/18/15  Yes Venetia Maxon Rama, MD  glimepiride (AMARYL) 2 MG tablet Take 1 tablet (2 mg total) by mouth daily with breakfast. 10/25/14  Yes Barton Dubois, MD  insulin detemir (LEVEMIR) 100 UNIT/ML injection Inject 0.1 mLs (10 Units total) into the skin  daily. 02/13/15  Yes Donne Hazel, MD  isosorbide dinitrate (ISORDIL) 5 MG tablet Take 1 tablet (5 mg total) by mouth 3 (three) times daily. Take 1 tablet by mouth three times a day 01/18/15  Yes Venetia Maxon Rama, MD  levETIRAcetam (KEPPRA) 500 MG tablet Take 1 tablet (500 mg total) by mouth 2 (two) times daily. 10/25/14  Yes Barton Dubois, MD  omeprazole (PRILOSEC) 20 MG capsule Take 20 mg by mouth daily.   Yes Historical Provider, MD  ondansetron (ZOFRAN ODT) 8 MG disintegrating tablet Take 1 tablet (8 mg total) by mouth every 8 (eight) hours as needed for nausea or vomiting. 10/25/14  Yes Barton Dubois, MD  potassium chloride SA (K-DUR,KLOR-CON) 20 MEQ tablet Take 2 tablets (40 mEq total) by mouth daily. 02/13/15  Yes Donne Hazel, MD  PROTEIN PO Take 2 scoop by mouth 2 (two) times daily. BeneProtein Supplement*   Yes Historical Provider, MD  saccharomyces boulardii (FLORASTOR) 250 MG capsule Take 1 capsule (250 mg total) by mouth 2 (two) times daily. 02/13/15  Yes Donne Hazel, MD  senna (SENOKOT) 8.6 MG TABS tablet Take 1 tablet by mouth at bedtime.   Yes Historical Provider, MD  methocarbamol (ROBAXIN) 500 MG tablet Take 1 tablet (500 mg total) by mouth once. 04/22/15   Junius Creamer, NP   BP 196/90 mmHg  Pulse 75  Temp(Src) 98.1 F (36.7 C) (Oral)  Resp 22  SpO2 99%  LMP 08/18/1970 Physical Exam  Constitutional: She is oriented to person, place, and time. She appears well-developed and well-nourished.  HENT:  Head: Normocephalic.  Eyes: Pupils are equal, round, and reactive to light.  Neck: Normal range of motion.  Cardiovascular: Normal rate and regular rhythm.   Pulmonary/Chest: Effort normal and breath sounds normal.  Abdominal: Soft.  Musculoskeletal: Normal range of motion.  Neurological: She is alert and oriented to person, place, and time.  Skin: Skin is warm.  Nursing note and vitals reviewed.   ED Course  Procedures (including critical care time) Labs Review Labs Reviewed   CBC WITH DIFFERENTIAL/PLATELET - Abnormal; Notable for the following:    WBC 11.4 (*)    RBC 3.79 (*)    Hemoglobin 11.0 (*)    HCT 34.2 (*)    Neutrophils Relative % 37 (*)    Lymphs Abs 5.4 (*)    Monocytes Relative 13 (*)    Monocytes Absolute 1.4 (*)    All other components within normal limits  URINALYSIS, ROUTINE W REFLEX MICROSCOPIC (NOT AT [facility]) - Abnormal; Notable for the following:    Hgb urine dipstick MODERATE (*)    Nitrite POSITIVE (*)    Leukocytes, UA MODERATE (*)    All other components within normal limits  URINE MICROSCOPIC-ADD ON - Abnormal; Notable for the following:    Bacteria, UA FEW (*)    All other components within normal limits  I-STAT CHEM 8, ED - Abnormal; Notable for the following:  Calcium, Ion 1.04 (*)    Hemoglobin 11.6 (*)    HCT 34.0 (*)    All other components within normal limits    Imaging Review Ct Renal Stone Study  04/22/2015   CLINICAL DATA:  74 year old female with back pain for the past 4 days.  EXAM: CT ABDOMEN AND PELVIS WITHOUT CONTRAST  TECHNIQUE: Multidetector CT imaging of the abdomen and pelvis was performed following the standard protocol without IV contrast.  COMPARISON:  CT the abdomen and pelvis 02/07/2015.  FINDINGS: Lower chest:  Unremarkable.  Hepatobiliary: No definite cystic or solid hepatic lesions are identified on today's noncontrast CT examination. Numerous calcified gallstones lying dependently in the gallbladder. No current findings to suggest an acute cholecystitis at this time.  Pancreas: No definite pancreatic mass or peripancreatic inflammatory changes on today's noncontrast CT examination.  Spleen: Unremarkable.  Adrenals/Urinary Tract: Tiny nonobstructive calculi in the right renal collecting system, largest of which measures 3 mm in the upper pole. No additional calculi are identified within the left renal collecting system, along the course of either ureter or within the lumen of the urinary bladder. No  hydroureteronephrosis to suggest urinary tract obstruction at this time. Mild bilateral perinephric stranding (nonspecific). The unenhanced appearance of the kidneys is otherwise unremarkable. Urinary bladder is nearly completely decompressed with a Foley balloon catheter in position.  Stomach/Bowel: The unenhanced appearance of the stomach is normal. No pathologic dilatation of small bowel or colon. Mass-like thickening of the distal rectum, asymmetric (right greater than left) where the rectal wall measures up to 1.7 cm in thickness.  Vascular/Lymphatic: Atherosclerosis throughout the abdominal and pelvic vasculature, without evidence of aneurysm. No lymphadenopathy noted in the abdomen or pelvis on today's noncontrast CT examination.  Reproductive: Status post hysterectomy. Ovaries are unremarkable in appearance.  Other: No significant volume of ascites.  No pneumoperitoneum.  Musculoskeletal: There are no aggressive appearing lytic or blastic lesions noted in the visualized portions of the skeleton.  IMPRESSION: 1. No acute findings in the abdomen or pelvis to account for the patient's symptoms. 2. There are small nonobstructive calculi in the right renal collecting system measuring up to 3 mm in the upper pole. No ureteral stones or findings of urinary tract obstruction are noted at this time. 3. Calcified gallstones are present gallbladder, but there is no evidence of acute cholecystitis at this time. 4. Mass-like thickening of the distal rectum, asymmetric (involving the right lateral wall to a greater extent than the rest), concerning for potential rectal neoplasm. Correlation with nonemergent physical examination and sigmoidoscopy is strongly suggested in the near future. 5. Atherosclerosis. 6. Additional incidental findings, as above.   Electronically Signed   By: Vinnie Langton M.D.   On: 04/22/2015 19:40   I have personally reviewed and evaluated these images and lab results as part of my medical  decision-making.   EKG Interpretation None     I discussed lab findings scans, x-rays, etc. with patient and her family.  They are comfortable taking her home.  She's been given her evening dose of Coreg due to her mildly elevated blood pressure here.  She been given a prescription for Robaxin, which helped decrease her discomfort and she will follow-up with her surgeon on Tuesday to set a date for her exploratory abdominal surgery due to her cancerous lesions MDM   Final diagnoses:  Flank pain         Junius Creamer, NP 04/22/15 [place], MD 04/24/15 1535

## 2015-04-22 NOTE — Discharge Instructions (Signed)
Flank Pain Flank pain refers to pain that is located on the side of the body between the upper abdomen and the back. The pain may occur over a short period of time (acute) or may be long-term or reoccurring (chronic). It may be mild or severe. Flank pain can be caused by many things. CAUSES  Some of the more common causes of flank pain include:  Muscle strains.   Muscle spasms.   A disease of your spine (vertebral disk disease).   A lung infection (pneumonia).   Fluid around your lungs (pulmonary edema).   A kidney infection.   Kidney stones.   A very painful skin rash caused by the chickenpox virus (shingles).   Gallbladder disease.  [place] care will depend on the cause of your pain. In general,  Rest as directed by your caregiver.  Drink enough fluids to keep your urine clear or pale yellow.  Only take over-the-counter or prescription medicines as directed by your caregiver. Some medicines may help relieve the pain.  Tell your caregiver about any changes in your pain.  Follow up with your caregiver as directed. SEEK IMMEDIATE MEDICAL CARE IF:   Your pain is not controlled with medicine.   You have new or worsening symptoms.  Your pain increases.   You have abdominal pain.   You have shortness of breath.   You have persistent nausea or vomiting.   You have swelling in your abdomen.   You feel faint or pass out.   You have blood in your urine.  You have a fever or persistent symptoms for more than 2-3 days.  You have a fever and your symptoms suddenly get worse. MAKE SURE YOU:   Understand these instructions.  Will watch your condition.  Will get help right away if you are not doing well or get worse. Document Released: 09/25/2005 Document Revised: 04/28/2012 Document Reviewed: 03/18/2012 [facility] Patient Information 2015 [place], [place]. This information is not intended to replace advice given to you by your  health care provider. Make sure you discuss any questions you have with your health care provider. You have no sign of urinary tract infection at this time, you do have one small stone in the kidney but nothing in the ureter or bladder.  I doubt this is causing your pain.  Of note, the CT scan showed a shadow in the rectal area but on review of the colonoscopy.  This appears to have been there before and most likely as a result of the biopsy.  Please call your surgeon on Tuesday report that you're having some increased pain to see for a surgical date. You were given a dose of Robaxin in the emergency room which is a muscle relaxer which seem to help your discomfort.  You have been given a prescription for this medication to take on a regular basis.  Follow-up with your primary care physician as well

## 2015-05-01 ENCOUNTER — Encounter (HOSPITAL_COMMUNITY)
Admission: RE | Admit: 2015-05-01 | Discharge: 2015-05-01 | Disposition: A | Discharge status: Home / Self Care | Payer: Medicare HMO | Source: Ambulatory Visit | Attending: General Surgery | Admitting: General Surgery

## 2015-05-01 ENCOUNTER — Encounter (HOSPITAL_COMMUNITY): Payer: Self-pay

## 2015-05-01 DIAGNOSIS — R404 Transient alteration of awareness: Secondary | ICD-10-CM

## 2015-05-01 DIAGNOSIS — I5032 Chronic diastolic (congestive) heart failure: Secondary | ICD-10-CM | POA: Diagnosis not present

## 2015-05-01 DIAGNOSIS — I6330 Cerebral infarction due to thrombosis of unspecified cerebral artery: Secondary | ICD-10-CM

## 2015-05-01 DIAGNOSIS — I10 Essential (primary) hypertension: Secondary | ICD-10-CM

## 2015-05-01 DIAGNOSIS — E1140 Type 2 diabetes mellitus with diabetic neuropathy, unspecified: Secondary | ICD-10-CM

## 2015-05-01 DIAGNOSIS — C7A8 Other malignant neuroendocrine tumors: Secondary | ICD-10-CM

## 2015-05-01 HISTORY — DX: Family history of other specified conditions: Z84.89

## 2015-05-01 NOTE — Progress Notes (Signed)
04/11/2015-LOV from Dr. Fredderick Phenix on chart. 04/19/2015  -labs on chart-CBC w/diff., HgA1C,CMP, Folate,TSH, Vitamin B-12

## 2015-05-01 NOTE — Progress Notes (Signed)
Faxed instructions over to [facility] [place] at [facility] so that they may follow the patient's instructions for surgery.

## 2015-05-01 NOTE — Progress Notes (Signed)
01/16/2015-noted in EPIC EKG and CXR. 02/07/2015-noted CT abd./pelvis w/ contrast in EPIC 04/22/2015-noted lab-CBC w/ diff. In EPIC

## 2015-05-01 NOTE — Patient Instructions (Addendum)
April [name]  05/01/2015   Your procedure is scheduled on: Thursday 05/03/2015  Report to [facility] Main  Entrance take [facility]  elevators to 3rd floor to  [place] at 1000 AM.  Call this number if you have problems the morning of surgery [phone]   Remember: ONLY 1 PERSON MAY GO WITH YOU TO SHORT STAY TO GET  READY MORNING OF [place].  Do not eat food or drink liquids :After Midnight.   FOLLOW BOWEL PREP INSTRUCTIONS FROM DR. THOMAS'S OFFICE THE DAY BEFORE SURGERY [facility] 05/02/2015) WITH A CLEAR LIQUID DIET ALL DAY!              TAKE  1/2  LEVEMIR INSULIN THE NIGHT BEFORE SURGERY AND NO DIABETIC MEDICATIONS MORNING OF SURGERY!   Take these medicines the morning of surgery with A SIP OF WATER: CARVEDILOL (COREG), ISOSORBIDE DINITRATE (ISORDIL), KEPPRA                               You may not have any metal on your body including hair pins and              piercings  Do not wear jewelry, make-up, lotions, powders or perfumes, deodorant             Do not wear nail polish.  Do not shave  48 hours prior to surgery.              Men may shave face and neck.   Do not bring valuables to the hospital. [place].  Contacts, dentures or bridgework may not be worn into surgery.  Leave suitcase in the car. After surgery it may be brought to your room.     Patients discharged the day of surgery will not be allowed to drive home.  Name and phone number of your driver:  Special Instructions: N/A              Please read over the following fact sheets you were given: _____________________________________________________________________             [facility] - Preparing for Surgery Before surgery, you can play an important role.  Because skin is not sterile, your skin needs to be as free of germs as possible.  You can reduce the number of germs on your skin by washing with CHG (chlorahexidine  gluconate) soap before surgery.  CHG is an antiseptic cleaner which kills germs and bonds with the skin to continue killing germs even after washing. Please DO NOT use if you have an allergy to CHG or antibacterial soaps.  If your skin becomes reddened/irritated stop using the CHG and inform your nurse when you arrive at Short Stay. Do not shave (including legs and underarms) for at least 48 hours prior to the first CHG shower.  You may shave your face/neck. Please follow these instructions carefully:  1.  Shower with CHG Soap the night before surgery and the  morning of Surgery.  2.  If you choose to wash your hair, wash your hair first as usual with your  normal  shampoo.  3.  After you shampoo, rinse your hair and body thoroughly to remove the  shampoo.  4.  Use CHG as you would any other liquid soap.  You can apply chg directly  to the skin and wash                       Gently with a scrungie or clean washcloth.  5.  Apply the CHG Soap to your body ONLY FROM THE NECK DOWN.   Do not use on face/ open                           Wound or open sores. Avoid contact with eyes, ears mouth and genitals (private parts).                       Wash face,  Genitals (private parts) with your normal soap.             6.  Wash thoroughly, paying special attention to the area where your surgery  will be performed.  7.  Thoroughly rinse your body with warm water from the neck down.  8.  DO NOT shower/wash with your normal soap after using and rinsing off  the CHG Soap.                9.  Pat yourself dry with a clean towel.            10.  Wear clean pajamas.            11.  Place clean sheets on your bed the night of your first shower and do not  sleep with pets. Day of Surgery : Do not apply any lotions/deodorants the morning of surgery.  Please wear clean clothes to the hospital/surgery center.  FAILURE TO FOLLOW THESE INSTRUCTIONS MAY RESULT IN THE CANCELLATION OF YOUR  SURGERY PATIENT SIGNATURE_________________________________  NURSE SIGNATURE__________________________________  ________________________________________________________________________    CLEAR LIQUID DIET   Foods Allowed                                                                     Foods Excluded  Coffee and tea, regular and decaf                             liquids that you cannot  Plain Jell-O in any flavor                                             see through such as: Fruit ices (not with fruit pulp)                                     milk, soups, orange juice  Iced Popsicles                                    All solid food Carbonated beverages, regular and diet  Cranberry, grape and apple juices Sports drinks like Gatorade Lightly seasoned clear broth or consume(fat free) Sugar, honey syrup  Sample Menu Breakfast                                Lunch                                     Supper Cranberry juice                    Beef broth                            Chicken broth Jell-O                                     Grape juice                           Apple juice Coffee or tea                        Jell-O                                      Popsicle                                                Coffee or tea                        Coffee or tea  _____________________________________________________________________    Incentive Spirometer  An incentive spirometer is a tool that can help keep your lungs clear and active. This tool measures how well you are filling your lungs with each breath. Taking long deep breaths may help reverse or decrease the chance of developing breathing (pulmonary) problems (especially infection) following:  A long period of time when you are unable to move or be active. BEFORE THE PROCEDURE   If the spirometer includes an indicator to show your best effort, your nurse or respiratory therapist  will set it to a desired goal.  If possible, sit up straight or lean slightly forward. Try not to slouch.  Hold the incentive spirometer in an upright position. INSTRUCTIONS FOR USE  1. Sit on the edge of your bed if possible, or sit up as far as you can in bed or on a chair. 2. Hold the incentive spirometer in an upright position. 3. Breathe out normally. 4. Place the mouthpiece in your mouth and seal your lips tightly around it. 5. Breathe in slowly and as deeply as possible, raising the piston or the ball toward the top of the column. 6. Hold your breath for 3-5 seconds or for as long as possible. Allow the piston or ball to fall to the bottom of the column. 7. Remove the mouthpiece from your mouth and breathe out normally. 8. Rest for a few seconds and repeat Steps 1 through 7 at  least 10 times every 1-2 hours when you are awake. Take your time and take a few normal breaths between deep breaths. 9. The spirometer may include an indicator to show your best effort. Use the indicator as a goal to work toward during each repetition. 10. After each set of 10 deep breaths, practice coughing to be sure your lungs are clear. If you have an incision (the cut made at the time of surgery), support your incision when coughing by placing a pillow or rolled up towels firmly against it. Once you are able to get out of bed, walk around indoors and cough well. You may stop using the incentive spirometer when instructed by your caregiver.  RISKS AND COMPLICATIONS  Take your time so you do not get dizzy or light-headed.  If you are in pain, you may need to take or ask for pain medication before doing incentive spirometry. It is harder to take a deep breath if you are having pain. AFTER USE  Rest and breathe slowly and easily.  It can be helpful to keep track of a log of your progress. Your caregiver can provide you with a simple table to help with this. If you are using the spirometer at home, follow  these instructions: [place] IF:   You are having difficultly using the spirometer.  You have trouble using the spirometer as often as instructed.  Your pain medication is not giving enough relief while using the spirometer.  You develop fever of 100.5 F (38.1 C) or higher. SEEK IMMEDIATE MEDICAL CARE IF:   You cough up bloody sputum that had not been present before.  You develop fever of 102 F (38.9 C) or greater.  You develop worsening pain at or near the incision site. MAKE SURE YOU:   Understand these instructions.  Will watch your condition.  Will get help right away if you are not doing well or get worse. Document Released: 12/15/2006 Document Revised: 10/27/2011 Document Reviewed: 02/15/2007 [facility] Patient Information 2014 [place], [place].   ________________________________________________________________________

## 2015-05-03 ENCOUNTER — Inpatient Hospital Stay (HOSPITAL_COMMUNITY)
Admission: RE | Admit: 2015-05-03 | Discharge: 2015-05-07 | DRG: 827 | Disposition: A | Discharge status: Skilled Nursing Facility | Payer: Medicare HMO | Source: Ambulatory Visit | Attending: General Surgery | Admitting: General Surgery

## 2015-05-03 ENCOUNTER — Inpatient Hospital Stay (HOSPITAL_COMMUNITY): Payer: Medicare HMO

## 2015-05-03 ENCOUNTER — Encounter (HOSPITAL_COMMUNITY)
Admission: RE | Disposition: A | Discharge status: Skilled Nursing Facility | Payer: Self-pay | Source: Ambulatory Visit | Attending: General Surgery

## 2015-05-03 ENCOUNTER — Encounter (HOSPITAL_COMMUNITY): Payer: Self-pay | Admitting: General Surgery

## 2015-05-03 ENCOUNTER — Inpatient Hospital Stay (HOSPITAL_COMMUNITY): Payer: Medicare HMO | Admitting: Certified Registered"

## 2015-05-03 DIAGNOSIS — I5030 Unspecified diastolic (congestive) heart failure: Secondary | ICD-10-CM | POA: Diagnosis not present

## 2015-05-03 DIAGNOSIS — G40909 Epilepsy, unspecified, not intractable, without status epilepticus: Secondary | ICD-10-CM | POA: Diagnosis present

## 2015-05-03 DIAGNOSIS — Z90710 Acquired absence of both cervix and uterus: Secondary | ICD-10-CM | POA: Diagnosis not present

## 2015-05-03 DIAGNOSIS — I6330 Cerebral infarction due to thrombosis of unspecified cerebral artery: Secondary | ICD-10-CM | POA: Diagnosis not present

## 2015-05-03 DIAGNOSIS — Z800 Family history of malignant neoplasm of digestive organs: Secondary | ICD-10-CM | POA: Diagnosis not present

## 2015-05-03 DIAGNOSIS — Z9841 Cataract extraction status, right eye: Secondary | ICD-10-CM | POA: Diagnosis not present

## 2015-05-03 DIAGNOSIS — IMO0002 Reserved for concepts with insufficient information to code with codable children: Secondary | ICD-10-CM

## 2015-05-03 DIAGNOSIS — C7A8 Other malignant neuroendocrine tumors: Secondary | ICD-10-CM | POA: Diagnosis present

## 2015-05-03 DIAGNOSIS — R4701 Aphasia: Secondary | ICD-10-CM | POA: Diagnosis present

## 2015-05-03 DIAGNOSIS — R339 Retention of urine, unspecified: Secondary | ICD-10-CM | POA: Diagnosis present

## 2015-05-03 DIAGNOSIS — I10 Essential (primary) hypertension: Secondary | ICD-10-CM | POA: Diagnosis present

## 2015-05-03 DIAGNOSIS — E785 Hyperlipidemia, unspecified: Secondary | ICD-10-CM | POA: Diagnosis present

## 2015-05-03 DIAGNOSIS — C772 Secondary and unspecified malignant neoplasm of intra-abdominal lymph nodes: Secondary | ICD-10-CM | POA: Diagnosis present

## 2015-05-03 DIAGNOSIS — Z8673 Personal history of transient ischemic attack (TIA), and cerebral infarction without residual deficits: Secondary | ICD-10-CM

## 2015-05-03 DIAGNOSIS — Z8249 Family history of ischemic heart disease and other diseases of the circulatory system: Secondary | ICD-10-CM

## 2015-05-03 DIAGNOSIS — Z87442 Personal history of urinary calculi: Secondary | ICD-10-CM | POA: Diagnosis not present

## 2015-05-03 DIAGNOSIS — E780 Pure hypercholesterolemia: Secondary | ICD-10-CM | POA: Diagnosis present

## 2015-05-03 DIAGNOSIS — R69 Illness, unspecified: Secondary | ICD-10-CM | POA: Diagnosis not present

## 2015-05-03 DIAGNOSIS — E1165 Type 2 diabetes mellitus with hyperglycemia: Secondary | ICD-10-CM | POA: Diagnosis present

## 2015-05-03 DIAGNOSIS — I5032 Chronic diastolic (congestive) heart failure: Secondary | ICD-10-CM | POA: Diagnosis present

## 2015-05-03 DIAGNOSIS — R4182 Altered mental status, unspecified: Secondary | ICD-10-CM

## 2015-05-03 DIAGNOSIS — Z833 Family history of diabetes mellitus: Secondary | ICD-10-CM

## 2015-05-03 DIAGNOSIS — Z882 Allergy status to sulfonamides status: Secondary | ICD-10-CM | POA: Diagnosis not present

## 2015-05-03 DIAGNOSIS — Z885 Allergy status to narcotic agent status: Secondary | ICD-10-CM | POA: Diagnosis not present

## 2015-05-03 DIAGNOSIS — E11319 Type 2 diabetes mellitus with unspecified diabetic retinopathy without macular edema: Secondary | ICD-10-CM | POA: Diagnosis present

## 2015-05-03 DIAGNOSIS — E119 Type 2 diabetes mellitus without complications: Secondary | ICD-10-CM

## 2015-05-03 DIAGNOSIS — Z79899 Other long term (current) drug therapy: Secondary | ICD-10-CM

## 2015-05-03 DIAGNOSIS — R1900 Intra-abdominal and pelvic swelling, mass and lump, unspecified site: Secondary | ICD-10-CM | POA: Diagnosis present

## 2015-05-03 DIAGNOSIS — Z961 Presence of intraocular lens: Secondary | ICD-10-CM | POA: Diagnosis present

## 2015-05-03 DIAGNOSIS — Z9842 Cataract extraction status, left eye: Secondary | ICD-10-CM

## 2015-05-03 DIAGNOSIS — K5900 Constipation, unspecified: Secondary | ICD-10-CM | POA: Diagnosis present

## 2015-05-03 DIAGNOSIS — E089 Diabetes mellitus due to underlying condition without complications: Secondary | ICD-10-CM | POA: Diagnosis not present

## 2015-05-03 DIAGNOSIS — R1310 Dysphagia, unspecified: Secondary | ICD-10-CM | POA: Diagnosis not present

## 2015-05-03 DIAGNOSIS — Z7982 Long term (current) use of aspirin: Secondary | ICD-10-CM

## 2015-05-03 DIAGNOSIS — K219 Gastro-esophageal reflux disease without esophagitis: Secondary | ICD-10-CM | POA: Diagnosis present

## 2015-05-03 DIAGNOSIS — Z23 Encounter for immunization: Secondary | ICD-10-CM

## 2015-05-03 DIAGNOSIS — Z794 Long term (current) use of insulin: Secondary | ICD-10-CM | POA: Diagnosis not present

## 2015-05-03 DIAGNOSIS — F329 Major depressive disorder, single episode, unspecified: Secondary | ICD-10-CM | POA: Diagnosis present

## 2015-05-03 DIAGNOSIS — I639 Cerebral infarction, unspecified: Secondary | ICD-10-CM | POA: Diagnosis not present

## 2015-05-03 DIAGNOSIS — R404 Transient alteration of awareness: Secondary | ICD-10-CM | POA: Diagnosis not present

## 2015-05-03 HISTORY — PX: LAPAROSCOPIC PARTIAL COLECTOMY: SHX5907

## 2015-05-03 LAB — COMPREHENSIVE METABOLIC PANEL
ALK PHOS: 173 U/L — AB (ref 38–126)
ALT: 13 U/L — AB (ref 14–54)
AST: 18 U/L (ref 15–41)
Albumin: 3.6 g/dL (ref 3.5–5.0)
Anion gap: 9 (ref 5–15)
BILIRUBIN TOTAL: 0.3 mg/dL (ref 0.3–1.2)
BUN: 13 mg/dL (ref 6–20)
CALCIUM: 8.9 mg/dL (ref 8.9–10.3)
CO2: 26 mmol/L (ref 22–32)
CREATININE: 0.76 mg/dL (ref 0.44–1.00)
Chloride: 106 mmol/L (ref 101–111)
Glucose, Bld: 169 mg/dL — ABNORMAL HIGH (ref 65–99)
Potassium: 3.8 mmol/L (ref 3.5–5.1)
Sodium: 141 mmol/L (ref 135–145)
TOTAL PROTEIN: 7.8 g/dL (ref 6.5–8.1)

## 2015-05-03 LAB — ABO/RH: ABO/RH(D): O POS

## 2015-05-03 LAB — GLUCOSE, CAPILLARY
GLUCOSE-CAPILLARY: 155 mg/dL — AB (ref 65–99)
GLUCOSE-CAPILLARY: 180 mg/dL — AB (ref 65–99)
Glucose-Capillary: 111 mg/dL — ABNORMAL HIGH (ref 65–99)
Glucose-Capillary: 209 mg/dL — ABNORMAL HIGH (ref 65–99)

## 2015-05-03 LAB — TYPE AND SCREEN
ABO/RH(D): O POS
Antibody Screen: NEGATIVE

## 2015-05-03 SURGERY — LAPAROSCOPIC PARTIAL COLECTOMY
Anesthesia: General

## 2015-05-03 MED ORDER — ROCURONIUM BROMIDE 100 MG/10ML IV SOLN
INTRAVENOUS | Status: DC | PRN
  Administered 2015-05-03: 5 mg via INTRAVENOUS
  Administered 2015-05-03: 25 mg via INTRAVENOUS
  Administered 2015-05-03: 10 mg via INTRAVENOUS

## 2015-05-03 MED ORDER — INSULIN ASPART 100 UNIT/ML ~~LOC~~ SOLN: 0.0000 [IU] | Freq: Every day | SUBCUTANEOUS | Status: DC

## 2015-05-03 MED ORDER — DEXAMETHASONE SODIUM PHOSPHATE 10 MG/ML IJ SOLN
INTRAMUSCULAR | Status: DC | PRN
  Administered 2015-05-03: 10 mg via INTRAVENOUS

## 2015-05-03 MED ORDER — ACETAMINOPHEN 500 MG PO TABS
1000.0000 mg | ORAL_TABLET | Freq: Four times a day (QID) | ORAL | Status: AC
  Administered 2015-05-03 – 2015-05-04 (×4): 1000 mg via ORAL
  Filled 2015-05-03 (×4): qty 2

## 2015-05-03 MED ORDER — ALVIMOPAN 12 MG PO CAPS
12.0000 mg | ORAL_CAPSULE | Freq: Two times a day (BID) | ORAL | Status: DC
  Administered 2015-05-04 – 2015-05-05 (×4): 12 mg via ORAL
  Filled 2015-05-03 (×5): qty 1

## 2015-05-03 MED ORDER — LABETALOL HCL 5 MG/ML IV SOLN
INTRAVENOUS | Status: DC | PRN
  Administered 2015-05-03 (×3): 5 mg via INTRAVENOUS

## 2015-05-03 MED ORDER — CARVEDILOL 3.125 MG PO TABS
3.2500 mg | ORAL_TABLET | Freq: Two times a day (BID) | ORAL | Status: DC
  Administered 2015-05-03 – 2015-05-07 (×8): 3.125 mg via ORAL
  Filled 2015-05-03 (×8): qty 1

## 2015-05-03 MED ORDER — DIPHENHYDRAMINE HCL 12.5 MG/5ML PO ELIX
12.5000 mg | ORAL_SOLUTION | Freq: Four times a day (QID) | ORAL | Status: DC | PRN
Start: 2015-05-03 — End: 2015-05-07

## 2015-05-03 MED ORDER — ROCURONIUM BROMIDE 100 MG/10ML IV SOLN
INTRAVENOUS | Status: AC
  Filled 2015-05-03: qty 1

## 2015-05-03 MED ORDER — FENTANYL CITRATE (PF) 100 MCG/2ML IJ SOLN: 25.0000 ug | INTRAMUSCULAR | Status: DC | PRN

## 2015-05-03 MED ORDER — DEXTROSE 5 % IV SOLN
2.0000 g | INTRAVENOUS | Status: AC
  Administered 2015-05-03: 2 g via INTRAVENOUS

## 2015-05-03 MED ORDER — GLIMEPIRIDE 2 MG PO TABS
2.0000 mg | ORAL_TABLET | Freq: Every day | ORAL | Status: DC
  Administered 2015-05-04 – 2015-05-07 (×4): 2 mg via ORAL
  Filled 2015-05-03 (×5): qty 1

## 2015-05-03 MED ORDER — ONDANSETRON HCL 4 MG/2ML IJ SOLN
INTRAMUSCULAR | Status: AC
  Filled 2015-05-03: qty 2

## 2015-05-03 MED ORDER — PANTOPRAZOLE SODIUM 40 MG PO TBEC
40.0000 mg | DELAYED_RELEASE_TABLET | Freq: Every day | ORAL | Status: DC
  Administered 2015-05-03 – 2015-05-07 (×5): 40 mg via ORAL
  Filled 2015-05-03 (×5): qty 1

## 2015-05-03 MED ORDER — ATORVASTATIN CALCIUM 20 MG PO TABS
20.0000 mg | ORAL_TABLET | Freq: Every day | ORAL | Status: DC
  Administered 2015-05-03 – 2015-05-06 (×4): 20 mg via ORAL
  Filled 2015-05-03: qty 2
  Filled 2015-05-03: qty 1
  Filled 2015-05-03: qty 2
  Filled 2015-05-03 (×2): qty 1

## 2015-05-03 MED ORDER — LACTATED RINGERS IR SOLN
Status: DC | PRN
  Administered 2015-05-03: 2000 mL

## 2015-05-03 MED ORDER — CEFOTETAN DISODIUM-DEXTROSE 2-2.08 GM-% IV SOLR
INTRAVENOUS | Status: AC
  Filled 2015-05-03: qty 50

## 2015-05-03 MED ORDER — LABETALOL HCL 5 MG/ML IV SOLN
5.0000 mg | INTRAVENOUS | Status: AC
  Administered 2015-05-03 (×4): 5 mg via INTRAVENOUS

## 2015-05-03 MED ORDER — PROMETHAZINE HCL 25 MG/ML IJ SOLN
INTRAMUSCULAR | Status: AC
  Filled 2015-05-03: qty 1

## 2015-05-03 MED ORDER — HYDRALAZINE HCL 20 MG/ML IJ SOLN
10.0000 mg | Freq: Four times a day (QID) | INTRAMUSCULAR | Status: DC | PRN
  Administered 2015-05-04: 10 mg via INTRAVENOUS
  Filled 2015-05-03: qty 1

## 2015-05-03 MED ORDER — GLYCOPYRROLATE 0.2 MG/ML IJ SOLN
INTRAMUSCULAR | Status: AC
  Filled 2015-05-03: qty 3

## 2015-05-03 MED ORDER — MORPHINE SULFATE (PF) 2 MG/ML IV SOLN
2.0000 mg | INTRAVENOUS | Status: DC | PRN
  Administered 2015-05-04 – 2015-05-06 (×5): 2 mg via INTRAVENOUS
  Filled 2015-05-03 (×5): qty 1

## 2015-05-03 MED ORDER — LEVETIRACETAM 500 MG PO TABS
500.0000 mg | ORAL_TABLET | Freq: Two times a day (BID) | ORAL | Status: DC
  Administered 2015-05-03 – 2015-05-07 (×8): 500 mg via ORAL
  Filled 2015-05-03 (×9): qty 1

## 2015-05-03 MED ORDER — INSULIN ASPART 100 UNIT/ML ~~LOC~~ SOLN
0.0000 [IU] | Freq: Three times a day (TID) | SUBCUTANEOUS | Status: DC
  Administered 2015-05-03: 5 [IU] via SUBCUTANEOUS

## 2015-05-03 MED ORDER — ONDANSETRON HCL 4 MG/2ML IJ SOLN
4.0000 mg | Freq: Four times a day (QID) | INTRAMUSCULAR | Status: DC | PRN
  Administered 2015-05-03: 4 mg via INTRAVENOUS
  Filled 2015-05-03: qty 2

## 2015-05-03 MED ORDER — FENTANYL CITRATE (PF) 100 MCG/2ML IJ SOLN
INTRAMUSCULAR | Status: DC | PRN
  Administered 2015-05-03 (×4): 50 ug via INTRAVENOUS

## 2015-05-03 MED ORDER — HYDROMORPHONE HCL 1 MG/ML IJ SOLN
0.2500 mg | INTRAMUSCULAR | Status: DC | PRN
  Administered 2015-05-03 (×2): 0.5 mg via INTRAVENOUS

## 2015-05-03 MED ORDER — LABETALOL HCL 5 MG/ML IV SOLN
INTRAVENOUS | Status: AC
  Filled 2015-05-03: qty 4

## 2015-05-03 MED ORDER — INSULIN DETEMIR 100 UNIT/ML ~~LOC~~ SOLN
10.0000 [IU] | Freq: Every day | SUBCUTANEOUS | Status: DC
Start: 2015-05-04 — End: 2015-05-06
  Administered 2015-05-04 – 2015-05-06 (×3): 10 [IU] via SUBCUTANEOUS
  Filled 2015-05-03 (×3): qty 0.1

## 2015-05-03 MED ORDER — ONDANSETRON HCL 4 MG PO TABS: 4.0000 mg | ORAL_TABLET | Freq: Four times a day (QID) | ORAL | Status: DC | PRN

## 2015-05-03 MED ORDER — LIDOCAINE HCL (CARDIAC) 20 MG/ML IV SOLN
INTRAVENOUS | Status: AC
  Filled 2015-05-03: qty 5

## 2015-05-03 MED ORDER — ALVIMOPAN 12 MG PO CAPS
12.0000 mg | ORAL_CAPSULE | Freq: Once | ORAL | Status: AC
  Administered 2015-05-03: 12 mg via ORAL
  Filled 2015-05-03: qty 1

## 2015-05-03 MED ORDER — BUPIVACAINE-EPINEPHRINE 0.25% -1:200000 IJ SOLN
INTRAMUSCULAR | Status: AC
  Filled 2015-05-03: qty 1

## 2015-05-03 MED ORDER — METOPROLOL TARTRATE 1 MG/ML IV SOLN: 5.0000 mg | Freq: Four times a day (QID) | INTRAVENOUS | Status: DC | PRN

## 2015-05-03 MED ORDER — GLYCOPYRROLATE 0.2 MG/ML IJ SOLN
INTRAMUSCULAR | Status: DC | PRN
  Administered 2015-05-03: 0.6 mg via INTRAVENOUS

## 2015-05-03 MED ORDER — ENOXAPARIN SODIUM 40 MG/0.4ML ~~LOC~~ SOLN
40.0000 mg | SUBCUTANEOUS | Status: DC
  Administered 2015-05-04 – 2015-05-07 (×4): 40 mg via SUBCUTANEOUS
  Filled 2015-05-03 (×4): qty 0.4

## 2015-05-03 MED ORDER — KCL IN DEXTROSE-NACL 20-5-0.45 MEQ/L-%-% IV SOLN
INTRAVENOUS | Status: DC
  Administered 2015-05-03 – 2015-05-04 (×3): via INTRAVENOUS
  Filled 2015-05-03 (×6): qty 1000

## 2015-05-03 MED ORDER — ONDANSETRON HCL 4 MG/2ML IJ SOLN: 4.0000 mg | Freq: Once | INTRAMUSCULAR | Status: DC | PRN

## 2015-05-03 MED ORDER — PROPOFOL 10 MG/ML IV BOLUS
INTRAVENOUS | Status: DC | PRN
  Administered 2015-05-03: 100 mg via INTRAVENOUS

## 2015-05-03 MED ORDER — LACTATED RINGERS IV SOLN
INTRAVENOUS | Status: DC
  Administered 2015-05-03: 12:00:00 via INTRAVENOUS
  Administered 2015-05-03: 1000 mL via INTRAVENOUS

## 2015-05-03 MED ORDER — ONDANSETRON HCL 4 MG/2ML IJ SOLN
INTRAMUSCULAR | Status: DC | PRN
  Administered 2015-05-03: 4 mg via INTRAVENOUS

## 2015-05-03 MED ORDER — INSULIN ASPART 100 UNIT/ML ~~LOC~~ SOLN
0.0000 [IU] | Freq: Three times a day (TID) | SUBCUTANEOUS | Status: DC
  Administered 2015-05-04: 2 [IU] via SUBCUTANEOUS
  Administered 2015-05-04: 3 [IU] via SUBCUTANEOUS
  Administered 2015-05-04: 2 [IU] via SUBCUTANEOUS
  Administered 2015-05-05: 1 [IU] via SUBCUTANEOUS
  Administered 2015-05-06: 2 [IU] via SUBCUTANEOUS
  Administered 2015-05-07: 1 [IU] via SUBCUTANEOUS
  Administered 2015-05-07: 2 [IU] via SUBCUTANEOUS

## 2015-05-03 MED ORDER — LIDOCAINE HCL (CARDIAC) 20 MG/ML IV SOLN
INTRAVENOUS | Status: DC | PRN
  Administered 2015-05-03: 25 mg via INTRAVENOUS

## 2015-05-03 MED ORDER — INFLUENZA VAC SPLIT QUAD 0.5 ML IM SUSY
0.5000 mL | PREFILLED_SYRINGE | INTRAMUSCULAR | Status: AC
  Administered 2015-05-05: 0.5 mL via INTRAMUSCULAR
  Filled 2015-05-03 (×3): qty 0.5

## 2015-05-03 MED ORDER — HYDROMORPHONE HCL 1 MG/ML IJ SOLN
INTRAMUSCULAR | Status: AC
  Filled 2015-05-03: qty 1

## 2015-05-03 MED ORDER — DEXTROSE 5 % IV SOLN
2.0000 g | Freq: Two times a day (BID) | INTRAVENOUS | Status: AC
  Administered 2015-05-03: 2 g via INTRAVENOUS
  Filled 2015-05-03: qty 2

## 2015-05-03 MED ORDER — BUPIVACAINE-EPINEPHRINE 0.25% -1:200000 IJ SOLN
INTRAMUSCULAR | Status: DC | PRN
  Administered 2015-05-03: 20 mL

## 2015-05-03 MED ORDER — GLYCOPYRROLATE 0.2 MG/ML IJ SOLN
INTRAMUSCULAR | Status: AC
  Filled 2015-05-03: qty 2

## 2015-05-03 MED ORDER — PHENYLEPHRINE HCL 10 MG/ML IJ SOLN
INTRAMUSCULAR | Status: DC | PRN
  Administered 2015-05-03: 80 ug via INTRAVENOUS
  Administered 2015-05-03 (×2): 40 ug via INTRAVENOUS
  Administered 2015-05-03: 80 ug via INTRAVENOUS

## 2015-05-03 MED ORDER — DEXAMETHASONE SODIUM PHOSPHATE 10 MG/ML IJ SOLN
INTRAMUSCULAR | Status: AC
  Filled 2015-05-03: qty 1

## 2015-05-03 MED ORDER — NEOSTIGMINE METHYLSULFATE 10 MG/10ML IV SOLN
INTRAVENOUS | Status: DC | PRN
  Administered 2015-05-03: 3 mg via INTRAVENOUS

## 2015-05-03 MED ORDER — POTASSIUM CHLORIDE CRYS ER 20 MEQ PO TBCR
40.0000 meq | EXTENDED_RELEASE_TABLET | Freq: Every day | ORAL | Status: DC
  Administered 2015-05-03 – 2015-05-07 (×5): 40 meq via ORAL
  Filled 2015-05-03 (×5): qty 2

## 2015-05-03 MED ORDER — LACTATED RINGERS IV SOLN
INTRAVENOUS | Status: DC
  Administered 2015-05-03: 16:00:00 via INTRAVENOUS

## 2015-05-03 MED ORDER — CARVEDILOL 3.125 MG PO TABS
3.1250 mg | ORAL_TABLET | Freq: Two times a day (BID) | ORAL | Status: DC
  Filled 2015-05-03: qty 1

## 2015-05-03 MED ORDER — NEOSTIGMINE METHYLSULFATE 10 MG/10ML IV SOLN
INTRAVENOUS | Status: AC
  Filled 2015-05-03: qty 1

## 2015-05-03 MED ORDER — ISOSORBIDE DINITRATE 5 MG PO TABS
5.0000 mg | ORAL_TABLET | Freq: Three times a day (TID) | ORAL | Status: DC
  Administered 2015-05-03 – 2015-05-07 (×11): 5 mg via ORAL
  Filled 2015-05-03 (×15): qty 1

## 2015-05-03 MED ORDER — DIPHENHYDRAMINE HCL 50 MG/ML IJ SOLN: 12.5000 mg | Freq: Four times a day (QID) | INTRAMUSCULAR | Status: DC | PRN

## 2015-05-03 MED ORDER — PROMETHAZINE HCL 25 MG/ML IJ SOLN
6.2500 mg | INTRAMUSCULAR | Status: DC | PRN
  Administered 2015-05-03: 6.25 mg via INTRAVENOUS

## 2015-05-03 MED ORDER — FENTANYL CITRATE (PF) 250 MCG/5ML IJ SOLN
INTRAMUSCULAR | Status: AC
  Filled 2015-05-03: qty 25

## 2015-05-03 MED ORDER — FUROSEMIDE 40 MG PO TABS
40.0000 mg | ORAL_TABLET | Freq: Every day | ORAL | Status: DC
  Administered 2015-05-04 – 2015-05-07 (×4): 40 mg via ORAL
  Filled 2015-05-03 (×4): qty 1

## 2015-05-03 MED ORDER — CARVEDILOL 3.125 MG PO TABS
3.1250 mg | ORAL_TABLET | ORAL | Status: AC
  Administered 2015-05-03: 3.125 mg via ORAL
  Filled 2015-05-03: qty 1

## 2015-05-03 SURGICAL SUPPLY — 70 items
APPLIER CLIP 5 13 M/L LIGAMAX5 (MISCELLANEOUS)
BLADE EXTENDED COATED 6.5IN (ELECTRODE) ×2 IMPLANT
CABLE HIGH FREQUENCY MONO STRZ (ELECTRODE) ×2 IMPLANT
CELLS DAT CNTRL 66122 CELL SVR (MISCELLANEOUS) IMPLANT
CHLORAPREP W/TINT 26ML (MISCELLANEOUS) IMPLANT
CLIP APPLIE 5 13 M/L LIGAMAX5 (MISCELLANEOUS) IMPLANT
COVER MAYO STAND STRL (DRAPES) ×6 IMPLANT
COVER SURGICAL LIGHT HANDLE (MISCELLANEOUS) IMPLANT
DECANTER SPIKE VIAL GLASS SM (MISCELLANEOUS) ×2 IMPLANT
DRAIN CHANNEL 19F RND (DRAIN) IMPLANT
DRAPE LAPAROSCOPIC ABDOMINAL (DRAPES) ×2 IMPLANT
DRAPE SURG IRRIG POUCH 19X23 (DRAPES) ×2 IMPLANT
DRSG OPSITE POSTOP 4X10 (GAUZE/BANDAGES/DRESSINGS) IMPLANT
DRSG OPSITE POSTOP 4X6 (GAUZE/BANDAGES/DRESSINGS) ×2 IMPLANT
DRSG OPSITE POSTOP 4X8 (GAUZE/BANDAGES/DRESSINGS) IMPLANT
ELECT PENCIL ROCKER SW 15FT (MISCELLANEOUS) ×4 IMPLANT
ELECT REM PT RETURN 9FT ADLT (ELECTROSURGICAL) ×2
ELECTRODE REM PT RTRN 9FT ADLT (ELECTROSURGICAL) ×1 IMPLANT
EVACUATOR SILICONE 100CC (DRAIN) IMPLANT
GAUZE SPONGE 4X4 12PLY STRL (GAUZE/BANDAGES/DRESSINGS) IMPLANT
GLOVE BIO SURGEON STRL SZ 6.5 (GLOVE) ×4 IMPLANT
GLOVE BIOGEL PI IND STRL 7.0 (GLOVE) ×6 IMPLANT
GLOVE BIOGEL PI INDICATOR 7.0 (GLOVE) ×6
GOWN STRL REUS W/TWL 2XL LVL3 (GOWN DISPOSABLE) ×4 IMPLANT
GOWN STRL REUS W/TWL XL LVL3 (GOWN DISPOSABLE) ×12 IMPLANT
LEGGING LITHOTOMY PAIR STRL (DRAPES) ×2 IMPLANT
LIGASURE IMPACT 36 18CM CVD LR (INSTRUMENTS) IMPLANT
LIQUID BAND (GAUZE/BANDAGES/DRESSINGS) ×2 IMPLANT
PACK COLON (CUSTOM PROCEDURE TRAY) ×2 IMPLANT
PAD POSITIONING PINK XL (MISCELLANEOUS) ×2 IMPLANT
PORT LAP GEL ALEXIS MED 5-9CM (MISCELLANEOUS) ×2 IMPLANT
RTRCTR WOUND ALEXIS 18CM MED (MISCELLANEOUS)
SCISSORS LAP 5X35 DISP (ENDOMECHANICALS) ×2 IMPLANT
SEALER TISSUE G2 STRG ARTC 35C (ENDOMECHANICALS) IMPLANT
SET IRRIG TUBING LAPAROSCOPIC (IRRIGATION / IRRIGATOR) ×2 IMPLANT
SLEEVE XCEL OPT CAN 5 100 (ENDOMECHANICALS) ×2 IMPLANT
SPONGE LAP 18X18 X RAY DECT (DISPOSABLE) IMPLANT
STAPLER GUN LINEAR PROX 60 (STAPLE) ×2 IMPLANT
STAPLER PROXIMATE 75MM BLUE (STAPLE) ×2 IMPLANT
STAPLER VISISTAT 35W (STAPLE) ×2 IMPLANT
SUCTION POOLE TIP (SUCTIONS) IMPLANT
SUT ETHILON 2 0 PS N (SUTURE) IMPLANT
SUT NOVA 1 T20/GS 25DT (SUTURE) IMPLANT
SUT NOVA NAB DX-16 0-1 5-0 T12 (SUTURE) IMPLANT
SUT PDS AB 1 CTX 36 (SUTURE) IMPLANT
SUT PDS AB 1 TP1 96 (SUTURE) IMPLANT
SUT PROLENE 2 0 KS (SUTURE) IMPLANT
SUT SILK 2 0 (SUTURE) ×1
SUT SILK 2 0 SH CR/8 (SUTURE) ×2 IMPLANT
SUT SILK 2-0 18XBRD TIE 12 (SUTURE) ×1 IMPLANT
SUT SILK 3 0 (SUTURE) ×1
SUT SILK 3 0 SH CR/8 (SUTURE) ×2 IMPLANT
SUT SILK 3-0 18XBRD TIE 12 (SUTURE) ×1 IMPLANT
SUT VIC AB 2-0 SH 18 (SUTURE) IMPLANT
SUT VIC AB 4-0 PS2 18 (SUTURE) IMPLANT
SUT VIC AB 4-0 PS2 27 (SUTURE) IMPLANT
SUT VICRYL 2 0 18  UND BR (SUTURE)
SUT VICRYL 2 0 18 UND BR (SUTURE) IMPLANT
SYS LAPSCP GELPORT 120MM (MISCELLANEOUS)
SYSTEM LAPSCP GELPORT 120MM (MISCELLANEOUS) IMPLANT
TOWEL OR 17X26 10 PK STRL BLUE (TOWEL DISPOSABLE) IMPLANT
TOWEL OR NON WOVEN STRL DISP B (DISPOSABLE) IMPLANT
TRAY FOLEY W/METER SILVER 14FR (SET/KITS/TRAYS/PACK) ×2 IMPLANT
TRAY FOLEY W/METER SILVER 16FR (SET/KITS/TRAYS/PACK) ×2 IMPLANT
TROCAR BLADELESS OPT 5 100 (ENDOMECHANICALS) ×2 IMPLANT
TROCAR XCEL 12X100 BLDLESS (ENDOMECHANICALS) IMPLANT
TROCAR XCEL BLUNT TIP 100MML (ENDOMECHANICALS) IMPLANT
TROCAR XCEL NON-BLD 11X100MML (ENDOMECHANICALS) IMPLANT
TUBING FILTER THERMOFLATOR (ELECTROSURGICAL) ×2 IMPLANT
TUBING INSUFFLATION 10FT LAP (TUBING) ×2 IMPLANT

## 2015-05-03 NOTE — Transfer of Care (Signed)
Immediate Anesthesia Transfer of Care Note  Patient: April [name]  Procedure(s) Performed: Procedure(s): LAPAROSCOPIC PARTIAL COLECTOMY (N/A)  Patient Location: PACU  Anesthesia Type:General  Level of Consciousness: alert   Airway & Oxygen Therapy: Patient connected to face mask oxygen  Post-op Assessment: Post -op Vital signs reviewed and stable  Post vital signs: stable  Last Vitals:  Filed Vitals:   05/03/15 0943  BP: 188/86  Pulse: 86  Temp: 36.7 C  Resp: 16    Complications: No apparent anesthesia complications

## 2015-05-03 NOTE — H&P (View-Only) (Signed)
April [name] 04/16/2015 11:09 AM Location: [facility] Patient #: [id number] DOB: 09/28/1940 Widowed / Language: English / Race: White Female History of Present Illness (Cynthia Cogle MD; 04/16/2015 11:43 AM) The patient is a 74 year old female who presents with an abdominal mass. 74-year-old female recently hospitalized due to an obstructing kidney stone and sepsis. CT scan obtained in the hospital showed a right-sided mesenteric mass concerning for malignancy or metastatic disease. She underwent a colonoscopy with Dr. Hung and was found to have a terminal ileal mass. Biopsy showed neuroendocrine tumor. She is also having problems with her constipation and occasional rectal bleeding. She is taking Dulcolax on a daily basis as well as Senokot. Patient has a diagnosis of congestive heart failure but does not appear to be followed by any cardiologist. Patient also has a history of seizure disorder and CVA as well and currently resides in an assisted living facility. 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 Tonsillectomy Cataract Surgery Bilateral. Appendectomy  Diagnostic Studies History (Abrahm Mancia, MD; 04/16/2015 12:27 PM) Mammogram >3 years ago Colonoscopy 5-10 years ago Pap Smear 1-5 years ago  Allergies (Ashley Beck, CMA; 04/16/2015 11:09 AM) Codeine Phosphate *ANALGESICS - OPIOID* Nausea, Vomiting. Sulfabenzamide *CHEMICALS* Nausea, Vomiting.  Medication History  (Kiree Dejarnette, MD; 04/16/2015 12:27 PM) Aspirin (325MG Tablet, Oral daily) Active. Atorvastatin Calcium (10MG Tablet, Oral) Active. (Unknown mg dosage) Furosemide (40MG Tablet, Oral daily) Active. Glimepiride (2MG Tablet, Oral qam) Active. Omeprazole (10MG Capsule DR, Oral qam) Active. Klor-Con 10 (10MEQ Tablet ER, Oral qam) Active. Beneprotein (2 scoops Oral bid) Active. Carvedilol (25MG Tablet, Oral bid) Active. Ondansetron (8MG Tablet, Oral q 8 hours) Active. (prn vomiting) Florastor (250MG Capsule, Oral bid) Active. LevETIRAcetam ER (500MG Tablet ER 24HR, Oral daily) Active. Medications Reconciled Neomycin Sulfate (500MG Tablet, 2 (two) Tablet Oral SEE NOTE, Taken starting 04/16/2015) Active. (TAKE TWO TABLETS AT 2 PM, 3 PM, AND 10 PM THE DAY PRIOR TO SURGERY) Flagyl (500MG Tablet, 2 (two) Tablet Oral SEE NOTE, Taken starting 04/16/2015) Active. (Take at 2pm, 3pm, and 10pm the day prior to your colon operation)  Social History (Asuncion Tapscott, MD; 04/16/2015 12:27 PM) Caffeine use Carbonated beverages, Coffee, Tea. No alcohol use Tobacco use Never smoker. No drug use  Family History (Sheritha Louis, MD; 04/16/2015 12:27 PM) Anesthetic complications Family Members In General. Alcohol Abuse Sister. Migraine Headache Sister. Kidney Disease Sister. Respiratory Condition Sister. Cancer Brother, Mother, Sister. Arthritis Brother, Mother, Sister. Diabetes Mellitus Brother, Family Members In General, Mother, Sister. Hypertension Sister. Heart Disease Mother.  Pregnancy / Birth History (Inara Dike, MD; 04/16/2015 12:27 PM) Regular periods Contraceptive History Oral contraceptives. Age at menarche 13 years. Gravida 1 Para 1 Maternal age 15-20    Vitals (Ashley Beck CMA; 04/16/2015 11:10 AM) 04/16/2015 11:10 AM Weight: 116 lb Height: 59in Body Surface Area: 1.48 m Body Mass Index: 23.43 kg/m Temp.: 97.9F(Temporal)  Pulse: 80  (Regular)  BP: 128/68 (Sitting, Left Arm, Standard)     Physical Exam (Ethelbert Thain MD; 04/16/2015 12:27 PM)  General Mental Status-Alert. General Appearance-Consistent with stated age. Hydration-Well hydrated. Voice-Normal.  Head and Neck Head-normocephalic, atraumatic with no lesions   or palpable masses. Trachea-midline. Thyroid Gland Characteristics - normal size and consistency.  Eye Eyeball - Bilateral-Extraocular movements intact. Sclera/Conjunctiva - Bilateral-No scleral icterus.  Chest and Lung Exam Chest and lung exam reveals -quiet, even and easy respiratory effort with no use of accessory muscles and on auscultation, normal breath sounds, no adventitious sounds and normal vocal resonance. Inspection Chest Wall - Normal. Back - normal.  Cardiovascular Cardiovascular examination reveals -normal heart sounds, regular rate and rhythm with no murmurs and normal pedal pulses bilaterally.  Abdomen Inspection Inspection of the abdomen reveals - No Hernias. Palpation/Percussion Palpation and Percussion of the abdomen reveal - Soft, Non Tender, No Rebound tenderness, No Rigidity (guarding) and No hepatosplenomegaly. Auscultation Auscultation of the abdomen reveals - Bowel sounds normal.  Neurologic Neurologic evaluation reveals -alert and oriented x 3 with no impairment of recent or remote memory. Mental Status-Normal.  Musculoskeletal Global Assessment -Note:no gross deformities.  Normal Exam - Left-Upper Extremity Strength Normal and Lower Extremity Strength Normal. Normal Exam - Right-Upper Extremity Strength Normal and Lower Extremity Strength Normal.    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 Colonoscopy shows a neuroendocrine tumor via biopsy. CT scan shows some calcified mesenteric nodules  consistent with metastatic disease. There are no obvious signs of liver metastases. I have recommended excision of her right colon and terminal ileum. We have discussed this in detail including laparoscopic and robotic techniques. We discussed risk and benefits of surgery as well. The surgery and anatomy were described to the patient as well as the risks of surgery and the possible complications. These include: Bleeding, deep abdominal infections and possible wound complications such as hernia and infection, damage to adjacent structures, leak of surgical connections, which can lead to other surgeries and possibly an ostomy, possible need for other procedures, such as abscess drains in radiology, possible prolonged hospital stay, possible diarrhea from removal of part of the colon, possible constipation from narcotics, possible bowel, prolonged fatigue/weakness or appetite loss, possible early recurrence of of disease, possible complications of their medical problems such as heart disease or arrhythmias or lung problems, death (less than 1%). I believe the patient understands and wishes to proceed with the surgery.  

## 2015-05-03 NOTE — Anesthesia Preprocedure Evaluation (Signed)
Anesthesia Evaluation  Patient identified by MRN, date of birth, ID band Patient awake    Reviewed: Allergy & Precautions, NPO status , Patient's Chart, lab work & pertinent test results, reviewed documented beta blocker date and time   Airway Mallampati: II  TM Distance: >3 FB Neck ROM: Full    Dental  (+) Edentulous Upper, Edentulous Lower, Upper Dentures, Lower Dentures, Dental Advisory Given   Pulmonary neg pulmonary ROS,    Pulmonary exam normal breath sounds clear to auscultation       Cardiovascular Exercise Tolerance: Good hypertension, Pt. on medications and Pt. on home beta blockers + Peripheral Vascular Disease and +CHF  Normal cardiovascular exam Rhythm:Regular Rate:Normal  H/O CHF although patient denies  She states her breathing is at baseline today. 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  retinopathy CVA, Residual Symptoms    GI/Hepatic Neg liver ROS, GERD  Medicated,  Endo/Other  diabetes, Well Controlled, Type 2, Insulin Dependent  Renal/GU   negative genitourinary   Musculoskeletal negative musculoskeletal ROS (+)   Abdominal   Peds negative pediatric ROS (+)  Hematology negative hematology ROS (+)   Anesthesia Other Findings   Reproductive/Obstetrics negative OB ROS                             Anesthesia Physical Anesthesia Plan  ASA: III  Anesthesia Plan: General   Post-op Pain Management:    Induction: Intravenous  Airway Management Planned: Oral ETT  Additional Equipment:   Intra-op Plan:   Post-operative Plan: Extubation in OR  Informed Consent:   Plan Discussed with: Surgeon  Anesthesia Plan Comments:         Anesthesia Quick Evaluation

## 2015-05-03 NOTE — Interval H&P Note (Signed)
History and Physical Interval Note:  05/03/2015 11:47 AM  April [name]  has presented today for surgery, with the diagnosis of neuroendocrine tumor  The various methods of treatment have been discussed with the patient and family. After consideration of risks, benefits and other options for treatment, the patient has consented to  Procedure(s): LAPAROSCOPIC PARTIAL COLECTOMY (N/A) as a surgical intervention .  The patient's history has been reviewed, patient examined, no change in status, stable for surgery.  I have reviewed the patient's chart and labs.  Questions were answered to the patient's satisfaction.     Rosario Adie, MD  [facility]

## 2015-05-03 NOTE — Anesthesia Procedure Notes (Signed)
Procedure Name: Intubation Date/Time: 05/03/2015 12:38 PM Performed by: Pilar Grammes Pre-anesthesia Checklist: Patient identified, Emergency Drugs available, Suction available, Patient being monitored and Timeout performed Patient Re-evaluated:Patient Re-evaluated prior to inductionOxygen Delivery Method: Circle system utilized Preoxygenation: Pre-oxygenation with 100% oxygen Intubation Type: IV induction Ventilation: Mask ventilation without difficulty Laryngoscope Size: 3 and Mac Grade View: Grade I Tube type: Oral Tube size: 7.0 mm Number of attempts: 1 Airway Equipment and Method: Stylet Placement Confirmation: positive ETCO2,  ETT inserted through vocal cords under direct vision,  CO2 detector and breath sounds checked- equal and bilateral Secured at: 23 cm Tube secured with: Tape Dental Injury: Teeth and Oropharynx as per pre-operative assessment

## 2015-05-03 NOTE — Anesthesia Postprocedure Evaluation (Signed)
  Anesthesia Post-op Note  Patient: April [name]  Procedure(s) Performed: Procedure(s) (LRB): LAPAROSCOPIC PARTIAL COLECTOMY (N/A)  Patient Location: PACU  Anesthesia Type: General  Level of Consciousness: awake and alert   Airway and Oxygen Therapy: Patient Spontanous Breathing  Post-op Pain: mild  Post-op Assessment: Post-op Vital signs reviewed, Patient's Cardiovascular Status Stable, Respiratory Function Stable, Patent Airway and No signs of Nausea or vomiting  Last Vitals:  Filed Vitals:   05/03/15 1510  BP: 186/84  Pulse: 76  Temp:   Resp: 16    Post-op Vital Signs: stable   Complications: No apparent anesthesia complications

## 2015-05-03 NOTE — Progress Notes (Signed)
Spoke to [place] at nursing home to verify last time patient took meds.

## 2015-05-03 NOTE — Op Note (Signed)
05/03/2015  2:03 PM  PATIENT:  April [name]  74 y.o. female  Patient Care Team: Nickola Major, NP as PCP - General (Internal Medicine)  PRE-OPERATIVE DIAGNOSIS:  neuroendocrine tumor  POST-OPERATIVE DIAGNOSIS:  neuroendocrine tumor  PROCEDURE:  LAPAROSCOPIC RIGHT COLECTOMY   Surgeon(s): [name], MD Michael Boston, MD  ASSISTANT: Dr Johney Maine   ANESTHESIA:   general  EBL: 30 ml Total I/O In: -  Out: 220 [Urine:220]  SPECIMEN:  Source of Specimen:  R colon and terminal ileum  DISPOSITION OF SPECIMEN:  PATHOLOGY  COUNTS:  YES  PLAN OF CARE: Admit to inpatient   PATIENT DISPOSITION:  PACU - hemodynamically stable.   INDICATIONS: 74 y.o. 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  I recommended a right hemicolectomy.    OR FINDINGS: Bulky mesenteric masses (resected with specimen), no other signs of metastatic disease  DESCRIPTION:  The patient was identified & brought into the operating room. The patient was positioned supine with both arms tucked. SCDs were active during the entire case. The patient underwent general anesthesia without any difficulty. A foley catheter was inserted under sterile conditions. The abdomen was prepped and draped in a sterile fashion. A Surgical Timeout confirmed our plan.  I made a vertical incision around the umbilicus and carried this down to the fascia with cautery.  The fascia was incised.  The peritoneum was opened.  There were minimal abdominal wall adhesions.  An [place] wound protector was placed.  The cap was placed and the abdomen was insufflated. Camera inspection revealed no injury. I placed additional ports under direct laparoscopic visualization.  I evaluated the entire abdomen laparoscopically.  The liver appeared normal, the large and small bowel were normal as well.  There were no signs of metastatic disease.  There were signs of venous obstruction and  hypertension within the local mesentery around the colon and into the lesser sac.  There were obvious enlarged masses in the mesentery consistent with metastatic lymph nodes.   I began by identifying the ileocolic artery and vein within the mesentery.  I dissected below the largest mass and identified the ileocolic artery and vein.  I transected the vein first using an Enseal device.  I then skeletonized the artery and transected this as well with the Enseal device.  I could see the duodenum underneath and made sure this was well away from our transection plane.    The duodenum was then freed from the mesenteric structures. I then separated the planes bluntly and used the Enseal device to transect these separately.  I developed the retroperitoneal plane bluntly.  I then freed the appendix off its attachments to the pelvic wall. I mobilized the terminal ileum.  I took care to avoid injuring any retroperitoneal structures.  After this I began to mobilize laterally down the white line of [place] and then took down the hepatic flexure using the Enseal device. I mobilized the omentum off of the right transverse colon. The entire colon was then flipped medially and mobilized off of the retroperitoneal structures until I could visualize the lateral edge of the duodenum underneath.  I gently freed the duodenal attachments.  At that point, the terminal ileum and right colon were removed from the wound. The terminal ileum was transected using a GIA blue load stapler. The remaining mesentery was divided using the Enseal device. I identified a portion of the transverse colon just distal to the hepatic flexure. A palpable pulse  could be obtained in the mesentery leading up to the transection point.  This was transected using another blue load GIA stapler.  An anastomosis was created between the terminal ileum and the transverse colon. This was done using a GIA blue load 75 mm stapler.  The common enterotomy channel was closed  using a TA 60 blue load stapler. Hemostasis was good at the staple line. Several 3-0 silk sutures were used to imbricate the edge of the anastomosis. An anti-tension suture was placed in the crotch of the anastomosis. This was then placed back into the abdomen. The abdomen was then irrigated with normal saline.  Hemostasis was good. The omentum was then brought down over the anastomosis. The [place] wound protector was removed, and we switched to clean instruments, gowns and drapes.  The fascia was then closed using #1 Novafil interrupted sutures. The subcutaneous tissue of the umbilical incision were closed using interrupted 2-0 Vicryl sutures. The skin was then closed using 4-0 Vicryl sutures. Dermabond was placed on the port sites and a sterile dressing was placed over the abdominal incision. All counts were correct per operating room staff. The patient was then awakened from anesthesia and sent to the post anesthesia care unit in stable condition.

## 2015-05-03 NOTE — Progress Notes (Signed)
Received patient from PACU, VS obtained, telemetry applied, oriented to unit, call light within reach, family at bedside

## 2015-05-03 NOTE — Consult Note (Addendum)
[facility] History and Physical  April [name] VOH:[id number] DOB: 12-17-1940 DOA: 05/03/2015  Referring physician: *General surgery PCP: Durenda Age, NP   Referring physician: [name], MD*  PCP: Durenda Age, NP   Consult requested for medical management by [name], MD  HPI:  74 year old female with a history of hypertension, dyslipidemia, diabetes, A1c of 10.7, seizure disorder, recently diagnosed with a neuroendocrine tumor status post laparoscopic partial colectomy by Dr. Marcello Moores today, seen in the PACU for medical management. Patient recently diagnosed with seizure disorder in March 2016 and started on Keppra. Recent echocardiogramEchocardiogram ejection fraction 60%, ultrasound of carotid artery showed less than 39% stenosis, anterograde flow of bilateral vertebral system. Patient had an acute ischemic infarct in March 2015. She is a poorly controlled diabetic, last LDL was 46. Recent TSH B-12 within normal limits. Currently lives at [facility], followed by [facility] neurology for history of CVA, memory difficulties and confusion. Diagnosed with a new colonic mass and seen today after surgery for a neuroendocrine tumor. She is hypertensive in the PACU     Review of Systems: negative for the following   Unable to perform a full review of systems as the patient is somnolent.    Past Medical History  Diagnosis Date  . Diabetes mellitus   . Hypertension   . Retinopathy 06/06/2013    Per eye exam 05/31/13 eye exam, Moderate retinopathy   . Aphasia   . Seizures   . Rectum bleeding   . HYPERLIPIDEMIA 10/15/2006    Qualifier: Diagnosis of  By: Beryle Lathe    . GERD (gastroesophageal reflux disease) 05/06/2013  . Gram-positive cocci bacteremia   . Seizure disorder, status epilepticus, nonconvulsive   . Cerebral infarction due to unspecified mechanism   . Uncontrolled type 2 diabetes mellitus with insulin therapy 01/17/2015  . Chronic  diastolic CHF (congestive heart failure), grade I 01/18/2015  . Family history of adverse reaction to anesthesia   . Stroke 10/21/2014     Past Surgical History  Procedure Laterality Date  . Appendectomy  1972  . Abdominal hysterectomy  1972  . Tee without cardioversion N/A 10/24/2014    Procedure: TRANSESOPHAGEAL ECHOCARDIOGRAM (TEE);  Surgeon: Dorothy Spark, MD;  Location: [place];  Service: Cardiovascular;  Laterality: N/A;  . Cystoscopy w/ ureteral stent placement Left 02/08/2015    Procedure: CYSTOSCOPY WITH RETROGRADE left PYELOGRAM/URETERAL STENT PLACEMENT left insertion of foley;  Surgeon: Franchot Gallo, MD;  Location: [facility];  Service: Urology;  Laterality: Left;  . Colonoscopy with propofol N/A 04/06/2015    Procedure: COLONOSCOPY WITH PROPOFOL;  Surgeon: Carol Ada, MD;  Location: [facility];  Service: Endoscopy;  Laterality: N/A;  . Left leg fracture      broken leg-left in 3 places  . Eye surgery  08/2014    bilateral cataract with lens implants  . Tonsillectomy      as child-age 42      Social History:  reports that she has never smoked. She has never used smokeless tobacco. She reports that she does not drink alcohol or use illicit drugs.    Allergies  Allergen Reactions  . Codeine Nausea And Vomiting  . Sulfa Antibiotics Nausea And Vomiting    Family History  Problem Relation Age of Onset  . Arthritis Maternal Grandmother   . Heart disease Maternal Grandmother 80  . Diabetes Mother   . Stomach cancer Mother        Prior to Admission medications   Medication Sig Start Date End Date  Taking? Authorizing Provider  atorvastatin (LIPITOR) 20 MG tablet Take 1 tablet (20 mg total) by mouth daily at 6 PM. 10/25/14  Yes Barton Dubois, MD  bisacodyl (DULCOLAX) 10 MG suppository Place 1 suppository (10 mg total) rectally daily. Patient taking differently: Place 10 mg rectally at bedtime.  02/13/15  Yes Donne Hazel, MD  carvedilol (COREG) 3.125 MG  tablet Take 1 tablet (3.125 mg total) by mouth 2 (two) times daily with a meal. 01/18/15  Yes Christina P Rama, MD  insulin detemir (LEVEMIR) 100 UNIT/ML injection Inject 0.1 mLs (10 Units total) into the skin daily. 02/13/15  Yes Donne Hazel, MD  aspirin EC 325 MG tablet Take 1 tablet (325 mg total) by mouth daily. 10/25/14   Barton Dubois, MD  furosemide (LASIX) 40 MG tablet Take 1 tablet daily. If you gain > 3lbs in 1 day or 5 lbs in 1 week, take an extra dose. 01/18/15   Venetia Maxon Rama, MD  glimepiride (AMARYL) 2 MG tablet Take 1 tablet (2 mg total) by mouth daily with breakfast. 10/25/14   Barton Dubois, MD  isosorbide dinitrate (ISORDIL) 5 MG tablet Take 1 tablet (5 mg total) by mouth 3 (three) times daily. Take 1 tablet by mouth three times a day 01/18/15   Venetia Maxon Rama, MD  levETIRAcetam (KEPPRA) 500 MG tablet Take 1 tablet (500 mg total) by mouth 2 (two) times daily. 10/25/14   Barton Dubois, MD  methocarbamol (ROBAXIN) 500 MG tablet Take 1 tablet (500 mg total) by mouth once. 04/22/15   Junius Creamer, NP  omeprazole (PRILOSEC) 20 MG capsule Take 20 mg by mouth daily.    Historical Provider, MD  ondansetron (ZOFRAN ODT) 8 MG disintegrating tablet Take 1 tablet (8 mg total) by mouth every 8 (eight) hours as needed for nausea or vomiting. 10/25/14   Barton Dubois, MD  potassium chloride SA (K-DUR,KLOR-CON) 20 MEQ tablet Take 2 tablets (40 mEq total) by mouth daily. 02/13/15   Donne Hazel, MD  PRESCRIPTION MEDICATION Beneprotein supplement- 2 scoops twice daily    Historical Provider, MD  saccharomyces boulardii (FLORASTOR) 250 MG capsule Take 1 capsule (250 mg total) by mouth 2 (two) times daily. 02/13/15   Donne Hazel, MD  senna (SENOKOT) 8.6 MG TABS tablet Take 1 tablet by mouth at bedtime.    Historical Provider, MD     Physical Exam: Filed Vitals:   05/03/15 0943 05/03/15 1021  BP: 188/86   Pulse: 86   Temp: 98 F (36.7 C)   TempSrc: Oral   Resp: 16   Height:  4\' 11"  (1.499 m)   Weight:  52.702 kg (116 lb 3 oz)  SpO2: 100%      Constitutional: Vital signs reviewed. Patient is a well-developed and well-nourished in no acute distress and cooperative with exam. Somnolent  Head: Normocephalic and atraumatic  Ear: TM normal bilaterally  Mouth: no erythema or exudates, MMM  Eyes: PERRL, EOMI, conjunctivae normal, No scleral icterus.  Neck: Supple, Trachea midline normal ROM, No JVD, mass, thyromegaly, or carotid bruit present.  Cardiovascular: RRR, S1 normal, S2 normal, no MRG, pulses symmetric and intact bilaterally  Pulmonary/Chest: CTAB, no wheezes, rales, or rhonchi  Abdominal: Soft. Non-tender, non-distended, bowel sounds are normal, no masses, organomegaly, or guarding present.  GU: no CVA tenderness Musculoskeletal: No joint deformities, erythema, or stiffness, ROM full and no nontender Ext: no edema and no cyanosis, pulses palpable bilaterally (DP and PT)  Hematology: no cervical, inginal, or axillary  adenopathy.  Neurological: Somnolent, Strenght is normal and symmetric bilaterally, cranial nerve II-XII are grossly intact, no focal motor deficit, sensory intact to light touch bilaterally.  Skin: Warm, dry and intact. No rash, cyanosis, or clubbing.  Psychiatric: Normal mood and affect. speech and behavior is normal. Judgment and thought content normal. Cognition and memory are normal.      Data Review   Micro Results No results found for this or any previous visit (from the past 240 hour(s)).  Radiology Reports Dg Chest 2 View  05/03/2015   CLINICAL DATA:  Pre op removal of colon cancer - diabetic - hx seizures - hx cerebral infarction - hx chronic diastolic CHF - hx TEE without cardioversion - hx uretal stent placement  EXAM: CHEST  2 VIEW  COMPARISON:  01/16/2015  FINDINGS: Cardiac silhouette is normal in size and configuration. Aorta is mildly uncoiled. No mediastinal or hilar masses or evidence of adenopathy.  Clear lungs.  No pleural effusion or  pneumothorax.  Bony thorax is demineralized but intact.  IMPRESSION: No active cardiopulmonary disease.   Electronically Signed   By: Lajean Manes M.D.   On: 05/03/2015 12:01   Ct Renal Stone Study  04/22/2015   CLINICAL DATA:  74 year old female with back pain for the past 4 days.  EXAM: CT ABDOMEN AND PELVIS WITHOUT CONTRAST  TECHNIQUE: Multidetector CT imaging of the abdomen and pelvis was performed following the standard protocol without IV contrast.  COMPARISON:  CT the abdomen and pelvis 02/07/2015.  FINDINGS: Lower chest:  Unremarkable.  Hepatobiliary: No definite cystic or solid hepatic lesions are identified on today's noncontrast CT examination. Numerous calcified gallstones lying dependently in the gallbladder. No current findings to suggest an acute cholecystitis at this time.  Pancreas: No definite pancreatic mass or peripancreatic inflammatory changes on today's noncontrast CT examination.  Spleen: Unremarkable.  Adrenals/Urinary Tract: Tiny nonobstructive calculi in the right renal collecting system, largest of which measures 3 mm in the upper pole. No additional calculi are identified within the left renal collecting system, along the course of either ureter or within the lumen of the urinary bladder. No hydroureteronephrosis to suggest urinary tract obstruction at this time. Mild bilateral perinephric stranding (nonspecific). The unenhanced appearance of the kidneys is otherwise unremarkable. Urinary bladder is nearly completely decompressed with a Foley balloon catheter in position.  Stomach/Bowel: The unenhanced appearance of the stomach is normal. No pathologic dilatation of small bowel or colon. Mass-like thickening of the distal rectum, asymmetric (right greater than left) where the rectal wall measures up to 1.7 cm in thickness.  Vascular/Lymphatic: Atherosclerosis throughout the abdominal and pelvic vasculature, without evidence of aneurysm. No lymphadenopathy noted in the abdomen or  pelvis on today's noncontrast CT examination.  Reproductive: Status post hysterectomy. Ovaries are unremarkable in appearance.  Other: No significant volume of ascites.  No pneumoperitoneum.  Musculoskeletal: There are no aggressive appearing lytic or blastic lesions noted in the visualized portions of the skeleton.  IMPRESSION: 1. No acute findings in the abdomen or pelvis to account for the patient's symptoms. 2. There are small nonobstructive calculi in the right renal collecting system measuring up to 3 mm in the upper pole. No ureteral stones or findings of urinary tract obstruction are noted at this time. 3. Calcified gallstones are present gallbladder, but there is no evidence of acute cholecystitis at this time. 4. Mass-like thickening of the distal rectum, asymmetric (involving the right lateral wall to a greater extent than the rest), concerning for potential rectal  neoplasm. Correlation with nonemergent physical examination and sigmoidoscopy is strongly suggested in the near future. 5. Atherosclerosis. 6. Additional incidental findings, as above.   Electronically Signed   By: Vinnie Langton M.D.   On: 04/22/2015 19:40     CBC No results for input(s): WBC, HGB, HCT, PLT, MCV, MCH, MCHC, RDW, LYMPHSABS, MONOABS, EOSABS, BASOSABS, BANDABS in the last 168 hours.  Invalid input(s): NEUTRABS, BANDSABD  Chemistries  No results for input(s): NA, K, CL, CO2, GLUCOSE, BUN, CREATININE, CALCIUM, MG, AST, ALT, ALKPHOS, BILITOT in the last 168 hours.  Invalid input(s): GFRCGP ------------------------------------------------------------------------------------------------------------------ estimated creatinine clearance is 46.5 mL/min (by C-G formula based on Cr of 0.7). ------------------------------------------------------------------------------------------------------------------ No results for input(s): HGBA1C in the last 72  hours. ------------------------------------------------------------------------------------------------------------------ No results for input(s): CHOL, HDL, LDLCALC, TRIG, CHOLHDL, LDLDIRECT in the last 72 hours. ------------------------------------------------------------------------------------------------------------------ No results for input(s): TSH, T4TOTAL, T3FREE, THYROIDAB in the last 72 hours.  Invalid input(s): FREET3 ------------------------------------------------------------------------------------------------------------------ No results for input(s): VITAMINB12, FOLATE, FERRITIN, TIBC, IRON, RETICCTPCT in the last 72 hours.  Coagulation profile No results for input(s): INR, PROTIME in the last 168 hours.  No results for input(s): DDIMER in the last 72 hours.  Cardiac Enzymes No results for input(s): CKMB, TROPONINI, MYOGLOBIN in the last 168 hours.  Invalid input(s): CK ------------------------------------------------------------------------------------------------------------------ Invalid input(s): POCBNP   CBG:  Recent Labs Lab 05/03/15 0945  GLUCAP 111*       EKG: Independently reviewed.   Assessment/Plan Status post colectomy-admitted under Dr. Marcello Moores, obtain baseline labs, CBC, CMP,  Seizure disorder-continue Keppra  History of CVA/TIA, restart aspirin IF surgery recommends to do so from a bleeding risk standpoint   Hypertension-restarted Coreg patient can continue prn hydralazine. If needed for systolic greater than 201, pain control will help bring the blood pressure down    Major depression-continue outpatient under the present    Hyperlipidemia-continue statin    Chronic diastolic CHF (congestive heart failure) without exacerbation, grade I-continue Lasix.    Neuroendocrine carcinoma-followed by general surgery   DVT prophylaxis per general surgery  Code Status:   full Family Communication: bedside Disposition Plan: admit   Total  time spent 55 minutes.Greater than 50% of this time was spent in counseling, explanation of diagnosis, planning of further management, and coordination of care  [facility] Pager [phone]  If 7PM-7AM, please contact night-coverage [email or web address] Password [facility] 05/03/2015, 2:24 PM

## 2015-05-04 DIAGNOSIS — I10 Essential (primary) hypertension: Secondary | ICD-10-CM

## 2015-05-04 DIAGNOSIS — R404 Transient alteration of awareness: Secondary | ICD-10-CM

## 2015-05-04 DIAGNOSIS — C7A8 Other malignant neuroendocrine tumors: Principal | ICD-10-CM

## 2015-05-04 DIAGNOSIS — I6330 Cerebral infarction due to thrombosis of unspecified cerebral artery: Secondary | ICD-10-CM

## 2015-05-04 DIAGNOSIS — E1140 Type 2 diabetes mellitus with diabetic neuropathy, unspecified: Secondary | ICD-10-CM

## 2015-05-04 DIAGNOSIS — I5032 Chronic diastolic (congestive) heart failure: Secondary | ICD-10-CM

## 2015-05-04 DIAGNOSIS — E785 Hyperlipidemia, unspecified: Secondary | ICD-10-CM

## 2015-05-04 LAB — BASIC METABOLIC PANEL
ANION GAP: 9 (ref 5–15)
BUN: 15 mg/dL (ref 6–20)
CO2: 24 mmol/L (ref 22–32)
Calcium: 8.8 mg/dL — ABNORMAL LOW (ref 8.9–10.3)
Chloride: 108 mmol/L (ref 101–111)
Creatinine, Ser: 0.86 mg/dL (ref 0.44–1.00)
GFR calc Af Amer: >60 mL/min (ref >60)
GFR calc non Af Amer: >60 mL/min (ref >60)
GLUCOSE: 256 mg/dL — AB (ref 65–99)
POTASSIUM: 4.2 mmol/L (ref 3.5–5.1)
Sodium: 141 mmol/L (ref 135–145)

## 2015-05-04 LAB — GLUCOSE, CAPILLARY
GLUCOSE-CAPILLARY: 162 mg/dL — AB (ref 65–99)
Glucose-Capillary: 211 mg/dL — ABNORMAL HIGH (ref 65–99)
Glucose-Capillary: 152 mg/dL — ABNORMAL HIGH (ref 65–99)
Glucose-Capillary: 129 mg/dL — ABNORMAL HIGH (ref 65–99)

## 2015-05-04 LAB — HEMOGLOBIN A1C
Hgb A1c MFr Bld: 7.3 % — ABNORMAL HIGH (ref 4.8–5.6)
Mean Plasma Glucose: 163 mg/dL

## 2015-05-04 LAB — CBC
HEMATOCRIT: 34.4 % — AB (ref 36.0–46.0)
HEMOGLOBIN: 10.9 g/dL — AB (ref 12.0–15.0)
MCH: 29.1 pg (ref 26.0–34.0)
MCHC: 31.7 g/dL (ref 30.0–36.0)
MCV: 91.7 fL (ref 78.0–100.0)
Platelets: 408 10*3/uL — ABNORMAL HIGH (ref 150–400)
RBC: 3.75 MIL/uL — ABNORMAL LOW (ref 3.87–5.11)
RDW: 15 % (ref 11.5–15.5)
WBC: 13.2 10*3/uL — ABNORMAL HIGH (ref 4.0–10.5)

## 2015-05-04 MED ORDER — IBUPROFEN 200 MG PO TABS
400.0000 mg | ORAL_TABLET | Freq: Four times a day (QID) | ORAL | Status: DC | PRN
  Administered 2015-05-04: 400 mg via ORAL
  Filled 2015-05-04: qty 2

## 2015-05-04 NOTE — Progress Notes (Signed)
1 Day Post-Op Lap R colectomy Subjective: Doing well.  Slept well last night.  No nausea.  Tolerating clears.  Adequate UOP.  Min pain.  Objective: Vital signs in last 24 hours: Temp:  [97.5 F (36.4 C)-98.8 F (37.1 C)] 98.8 F (37.1 C) (09/16 0554) Pulse Rate:  [73-95] 93 (09/16 0554) Resp:  [10-23] 17 (09/16 0554) BP: (124-200)/(65-101) 153/75 mmHg (09/16 0554) SpO2:  [93 %-100 %] 98 % (09/16 0554) Weight:  [52.702 kg (116 lb 3 oz)] 52.702 kg (116 lb 3 oz) (09/15 1021)   Intake/Output from previous day: 09/15 0701 - 09/16 0700 In: 2508.8 [P.O.:600; I.V.:1658.8] Out: 970 [Urine:970] Intake/Output this shift:     General appearance: alert and cooperative GI: normal findings: soft, nontender, non-distended  Incision: no significant drainage, no significant erythema  Lab Results:   Recent Labs  05/04/15 0507  WBC 13.2*  HGB 10.9*  HCT 34.4*  PLT 408*   BMET  Recent Labs  05/03/15 1500 05/04/15 0507  NA 141 141  K 3.8 4.2  CL 106 108  CO2 26 24  GLUCOSE 169* 256*  BUN 13 15  CREATININE 0.76 0.86  CALCIUM 8.9 8.8*   PT/INR No results for input(s): LABPROT, INR in the last 72 hours. ABG No results for input(s): PHART, HCO3 in the last 72 hours.  Invalid input(s): PCO2, PO2  MEDS, Scheduled . acetaminophen  1,000 mg Oral 4 times per day  . alvimopan  12 mg Oral BID  . atorvastatin  20 mg Oral q1800  . carvedilol  3.125 mg Oral BID WC  . enoxaparin (LOVENOX) injection  40 mg Subcutaneous Q24H  . furosemide  40 mg Oral Daily  . glimepiride  2 mg Oral Q breakfast  . Influenza vac split quadrivalent PF  0.5 mL Intramuscular Tomorrow-1000  . insulin aspart  0-15 Units Subcutaneous TID WC  . insulin aspart  0-5 Units Subcutaneous QHS  . insulin aspart  0-9 Units Subcutaneous TID WC  . insulin detemir  10 Units Subcutaneous Daily  . isosorbide dinitrate  5 mg Oral TID  . levETIRAcetam  500 mg Oral BID  . pantoprazole  40 mg Oral Daily  . potassium  chloride SA  40 mEq Oral Daily    Studies/Results: Dg Chest 2 View  05/03/2015   CLINICAL DATA:  Pre op removal of colon cancer - diabetic - hx seizures - hx cerebral infarction - hx chronic diastolic CHF - hx TEE without cardioversion - hx uretal stent placement  EXAM: CHEST  2 VIEW  COMPARISON:  01/16/2015  FINDINGS: Cardiac silhouette is normal in size and configuration. Aorta is mildly uncoiled. No mediastinal or hilar masses or evidence of adenopathy.  Clear lungs.  No pleural effusion or pneumothorax.  Bony thorax is demineralized but intact.  IMPRESSION: No active cardiopulmonary disease.   Electronically Signed   By: Lajean Manes M.D.   On: 05/03/2015 12:01    Assessment: s/p Procedure(s): LAPAROSCOPIC PARTIAL COLECTOMY Patient Active Problem List   Diagnosis Date Noted  . Neuroendocrine carcinoma 05/03/2015  . Pyelonephritis 02/08/2015  . Sepsis 02/08/2015  . Hydroureteronephrosis 02/08/2015  . Ureterolithiasis 02/08/2015  . Diarrhea 02/08/2015  . ARF (acute renal failure) 02/08/2015  . Chronic diastolic CHF (congestive heart failure), grade I 01/18/2015  . Uncontrolled type 2 diabetes mellitus with insulin therapy 01/17/2015  . Hyperlipidemia 01/17/2015  . Cerebrovascular disease 01/17/2015  . Seizure disorder 01/17/2015  . Cerebral thrombosis with cerebral infarction 10/22/2014  . Aphasia 10/21/2014  .  Retinopathy 06/06/2013  . GERD (gastroesophageal reflux disease) 05/06/2013  . Major depression 10/15/2006  . HYPERTENSION, BENIGN SYSTEMIC 10/15/2006    Expected post op course  Plan: Cont clears  PT/OT eval Ambulate in hall Elevated blood glucose: cont SSI, further management per hospitalist (Dr Allyson Sabal).  Appreciate her assistance.   Cont foley, placed for chronic urinary retention   LOS: 1 day     .Rosario Adie, [facility], [place]   05/04/2015 8:53 AM

## 2015-05-04 NOTE — Evaluation (Signed)
Occupational Therapy Evaluation Patient Details Name: TELENA PEYSER MRN: [id number] DOB: 06-26-1941 Today's Date: 05/04/2015    History of Present Illness The patient is a 74 year old female who presents with an abdominal mass. Pt recently hospitalized due to an obstructing kidney stone and sepsis. Pt underwent laparoscopic R colectomy on 05/03/15. Pt wtih PMH of seizure disorder, recent CVA,and HTN. Pt d/c from hospital after CVA to SNF for rehab and then to ALF   Clinical Impression   Pt requesting to return to bed to rest. Worked on LB self care and pt able to cross LEs up very well. Worked on safety with walker for functional transfers. Will follow on acute to progress ADL independence for return to ALF.    Follow Up Recommendations  Home health OT  (at ALF)   Equipment Recommendations  None recommended by OT    Recommendations for Other Services       Precautions / Restrictions Precautions Precautions: Fall Precaution Comments: abdominal incision Restrictions Weight Bearing Restrictions: No      Mobility Bed Mobility Overal bed mobility: Needs Assistance Bed Mobility: Sit to Supine       Sit to supine: HOB elevated;Min guard      Transfers Overall transfer level: Needs assistance Equipment used: Rolling walker (2 wheeled) Transfers: Sit to/from Stand Sit to Stand: Min guard         General transfer comment: close guard for safety.     Balance Overall balance assessment: Needs assistance   Sitting balance-Leahy Scale: Good       Standing balance-Leahy Scale: Fair                              ADL Overall ADL's : Needs assistance/impaired Eating/Feeding: Independent Eating/Feeding Details (indicate cue type and reason): clear liquid Grooming: Wash/dry hands;Set up;Sitting   Upper Body Bathing: Set up;Sitting   Lower Body Bathing: Min guard;Sit to/from stand   Upper Body Dressing : Set up;Sitting   Lower Body Dressing: Min  guard;Sit to/from stand   Toilet Transfer: Minimal assistance;Stand-pivot;RW   Toileting- Water quality scientist and Hygiene: Min guard;Sit to/from stand         General ADL Comments: Min assist to steady as pt tried to sit down on EOB before fully backing up to bed. Cues for hand placement and overall safety. pt able to cross LEs up to don/doff socks.      Vision Additional Comments: pt states she was due back this week to opthamologist followup after her CVA. She states she can now read whereas she hadnt been able to do this after her stroke. Per gross assessment, pt able to detect object in bilateral peripheral fields grossly equally. Pt states eyes are not sensitive to light as they had been.    Perception     Praxis      Pertinent Vitals/Pain Pain Assessment: Faces Faces Pain Scale: Hurts a little bit Pain Descriptors / Indicators: Sore Pain Intervention(s): Patient requesting pain meds-RN notified;Repositioned     Hand Dominance Right   Extremity/Trunk Assessment Upper Extremity Assessment Upper Extremity Assessment: Overall WFL for tasks assessed           Communication Communication Communication: No difficulties   Cognition Arousal/Alertness: Awake/alert Behavior During Therapy: WFL for tasks assessed/performed Overall Cognitive Status: Within Functional Limits for tasks assessed  General Comments       Exercises       Shoulder Instructions      Home Living Family/patient expects to be discharged to:: Assisted living Living Arrangements: Other (Comment) (ALF)                           Home Equipment: Walker - 2 wheels;Wheelchair - manual   Additional Comments: they help minimally with showers      Prior Functioning/Environment Level of Independence: Independent with assistive device(s);Needs assistance    ADL's / Homemaking Assistance Needed: assist for shower. She can bathe, dress, toilet herself.          OT Diagnosis: Generalized weakness   OT Problem List: Decreased strength;Decreased knowledge of use of DME or AE   OT Treatment/Interventions: Self-care/ADL training;Patient/family education;Therapeutic activities;DME and/or AE instruction    OT Goals(Current goals can be found in the care plan section) Acute Rehab OT Goals Patient Stated Goal: return to more independence. OT Goal Formulation: With patient Time For Goal Achievement: 05/18/15 Potential to Achieve Goals: Good  OT Frequency: Min 2X/week   Barriers to D/C:            Co-evaluation              End of Session Equipment Utilized During Treatment: Rolling walker  Activity Tolerance: Patient tolerated treatment well Patient left: in bed;with call bell/phone within reach;with family/visitor present   Time: 1224-8250 OT Time Calculation (min): 27 min Charges:  OT General Charges $OT Visit: 1 Procedure OT Evaluation $Initial OT Evaluation Tier I: 1 Procedure OT Treatments $Therapeutic Activity: 8-22 mins G-Codes:    Jules Schick  [phone] 05/04/2015, 12:45 PM

## 2015-05-04 NOTE — Evaluation (Signed)
Physical Therapy Evaluation Patient Details Name: April [name] MRN: [id number] DOB: 06/11/1941 Today's Date: 05/04/2015   History of Present Illness  The patient is a 74 year old female who presents with an abdominal mass. Pt recently hospitalized due to an obstructing kidney stone and sepsis. Pt underwent laparoscopic R colectomy on 05/03/15 due to neuroendocrine tumor. Pt wtih PMH of seizure disorder, recent CVA,and HTN. Pt d/c from hospital after CVA to SNF for rehab and then to ALF  Clinical Impression  Pt admitted with above diagnosis. Pt currently with functional limitations due to the deficits listed below (see PT Problem List).  Pt will benefit from skilled PT to increase their independence and safety with mobility to allow discharge to the venue listed below.  Pt assisted with ambulating hallway today and mobilizing well however with slow pace.  Pt plans to d/c home (ALF) and states her family is working on acquiring a caregiver to be available upon d/c.  Recommend nursing staff ambulate with pt during acute stay as well (NT in room and aware).     Follow Up Recommendations Home health PT;Supervision - Intermittent    Equipment Recommendations  None recommended by PT    Recommendations for Other Services       Precautions / Restrictions Precautions Precautions: Fall Precaution Comments: abdominal incision Restrictions Weight Bearing Restrictions: No      Mobility  Bed Mobility Overal bed mobility: Needs Assistance Bed Mobility: Sit to Supine       Sit to supine: HOB elevated;Min guard   General bed mobility comments: sitting EOB on arrival  Transfers Overall transfer level: Needs assistance Equipment used: Rolling walker (2 wheeled) Transfers: Sit to/from Stand Sit to Stand: Min assist         General transfer comment: assist to rise from low surface, verbal cues for hand placement, verbal cue for backing up to recliner before  sitting  Ambulation/Gait Ambulation/Gait assistance: Min guard Ambulation Distance (Feet): 100 Feet Assistive device: Rolling walker (2 wheeled) Gait Pattern/deviations: Step-through pattern;Decreased stance time - left;Decreased dorsiflexion - left Gait velocity: decreased   General Gait Details: tends to keep L LE stiff (less flexion) and increased external rotation and/or toe out, very slow pace however steady with RW  Stairs            Wheelchair Mobility    Modified Rankin (Stroke Patients Only)       Balance Overall balance assessment: Needs assistance   Sitting balance-Leahy Scale: Good     Standing balance support: Bilateral upper extremity supported;During functional activity Standing balance-Leahy Scale: Poor Standing balance comment: requires UE support for standing and gait                             Pertinent Vitals/Pain Pain Assessment: Faces Faces Pain Scale: Hurts a little bit Pain Location: abdomen Pain Descriptors / Indicators: Sore Pain Intervention(s): Limited activity within patient's tolerance;Monitored during session;Premedicated before session;Repositioned    Home Living Family/patient expects to be discharged to:: Assisted living Living Arrangements: Other (Comment) (ALF)             Home Equipment: Wheelchair - manual;Walker - 4 wheels Additional Comments: they help minimally with showers    Prior Function Level of Independence: Independent with assistive device(s);Needs assistance   Gait / Transfers Assistance Needed: ambulatory with 4 wheel walker with seat  ADL's / Homemaking Assistance Needed: assist for shower. She can bathe, dress, toilet herself.  Hand Dominance   Dominant Hand: Right    Extremity/Trunk Assessment   Upper Extremity Assessment: Overall WFL for tasks assessed           Lower Extremity Assessment: Overall WFL for tasks assessed         Communication   Communication: No  difficulties  Cognition Arousal/Alertness: Awake/alert Behavior During Therapy: WFL for tasks assessed/performed Overall Cognitive Status: Within Functional Limits for tasks assessed                      General Comments      Exercises        Assessment/Plan    PT Assessment Patient needs continued PT services  PT Diagnosis Difficulty walking   PT Problem List Decreased activity tolerance;Decreased mobility;Decreased knowledge of use of DME;Pain  PT Treatment Interventions DME instruction;Gait training;Functional mobility training;Patient/family education;Therapeutic activities;Therapeutic exercise   PT Goals (Current goals can be found in the Care Plan section) Acute Rehab PT Goals Patient Stated Goal: return to more independence. PT Goal Formulation: With patient Time For Goal Achievement: 05/11/15 Potential to Achieve Goals: Good    Frequency Min 3X/week   Barriers to discharge        Co-evaluation               End of Session   Activity Tolerance: Patient tolerated treatment well Patient left: in chair;with call bell/phone within reach;with family/visitor present Nurse Communication: Mobility status         Time: 4585-9292 PT Time Calculation (min) (ACUTE ONLY): 19 min   Charges:   PT Evaluation $Initial PT Evaluation Tier I: 1 Procedure     PT G Codes:        LEMYRE,KATHrine E 05/04/2015, 12:59 PM Carmelia Bake, PT, DPT 05/04/2015 Pager: [phone]

## 2015-05-04 NOTE — Progress Notes (Addendum)
[facility] PROGRESS NOTE  DAWNMARIE BREON NTI:[id number] DOB: [birth year] DOA: 05/03/2015 PCP: Durenda Age, NP  Assessment/Plan: Active Problems:   Major depression   HYPERTENSION, BENIGN SYSTEMIC   Cerebral thrombosis with cerebral infarction   Uncontrolled type 2 diabetes mellitus with insulin therapy   Hyperlipidemia   Seizure disorder   Chronic diastolic CHF (congestive heart failure), grade I   Neuroendocrine carcinoma    Status post colectomy-admitted under Dr. Marcello Moores, started on clear liquid diet by surgery, follow elevated white count  Seizure disorder-continue Keppra, patient has remained seizure-free since being started on Keppra, no breakthrough seizures  History of CVA/TIA, restart aspirin IF surgery recommends to do so from a bleeding risk standpoint   Hypertension-restarted Coreg patient can continue prn hydralazine. If needed for systolic greater than 867, pain control will help bring the blood pressure down   Major depression-continue outpatient under the present   Hyperlipidemia-continue statin   Chronic diastolic CHF (congestive heart failure) without exacerbation, grade I-continue Lasix.   Neuroendocrine carcinoma-followed by general surgery  Diabetes mellitus-hemoglobin A1c 7.3 continue sliding scale insulin/Lantus, patient takes Levemir 10 units and Amaryl at home  DVT prophylaxsis   Code Status:      Code Status Orders        Start     Ordered   05/03/15 1635  Full code   Continuous     05/03/15 1634    Advance Directive Documentation        Most Recent Value   Type of Advance Directive  Healthcare Power of Attorney, Living will   Pre-existing out of facility DNR order (yellow form or pink MOST form)     "MOST" Form in Place?       Family Communication: family updated about patient's clinical progress Disposition Plan:  per surgery   Brief narrative: 74 year old female with a history of hypertension,  dyslipidemia, diabetes, A1c of 10.7, seizure disorder, recently diagnosed with a neuroendocrine tumor status post laparoscopic partial colectomy by Dr. Marcello Moores today, seen in the PACU for medical management. Patient recently diagnosed with seizure disorder in March 2016 and started on Keppra. Recent echocardiogramEchocardiogram ejection fraction 60%, ultrasound of carotid artery showed less than 39% stenosis, anterograde flow of bilateral vertebral system. Patient had an acute ischemic infarct in March 2015. She is a poorly controlled diabetic, last LDL was 46. Recent TSH B-12 within normal limits. Currently lives at [facility], followed by [facility] neurology for history of CVA, memory difficulties and confusion. Diagnosed with a new colonic mass and seen today after surgery for a neuroendocrine tumor. She is hypertensive in the PACU   Consultants:  Internal medicine  Procedures: Post-Op Lap R colectomy  Antibiotics: Anti-infectives    Start     Dose/Rate Route Frequency Ordered Stop   05/03/15 2200  cefoTEtan (CEFOTAN) 2 g in dextrose 5 % 50 mL IVPB     2 g 100 mL/hr over 30 Minutes Intravenous Every 12 hours 05/03/15 1634 05/03/15 2152   05/03/15 0951  cefoTEtan (CEFOTAN) 2 g in dextrose 5 % 50 mL IVPB     2 g 100 mL/hr over 30 Minutes Intravenous On call to O.R. 05/03/15 0951 05/03/15 1219         HPI/Subjective: Patient doing extremely well postoperatively, denies pain, vomiting, nausea, no BM  Objective: Filed Vitals:   05/03/15 1626 05/03/15 2120 05/04/15 0554 05/04/15 1412  BP: 160/79 124/71 153/75 176/74  Pulse: 87 95 93 84  Temp: 97.7 F (  36.5 C) 98.2 F (36.8 C) 98.8 F (37.1 C) 98.8 F (37.1 C)  TempSrc:  Oral Oral Oral  Resp: 18 18 17 18   Height:      Weight:      SpO2: 95% 98% 98% 96%    Intake/Output Summary (Last 24 hours) at 05/04/15 1446 Last data filed at 05/04/15 1358  Gross per 24 hour  Intake 2678.75 ml  Output   2050 ml  Net 628.75 ml     Exam:  General: No acute respiratory distress Lungs: Clear to auscultation bilaterally without wheezes or crackles Cardiovascular: Regular rate and rhythm without murmur gallop or rub normal S1 and S2 Abdomen: Nontender, nondistended, soft, bowel sounds positive, no rebound, no ascites, no appreciable mass Extremities: No significant cyanosis, clubbing, or edema bilateral lower extremities     Data Review   Micro Results No results found for this or any previous visit (from the past 240 hour(s)).  Radiology Reports Dg Chest 2 View  05/03/2015   CLINICAL DATA:  Pre op removal of colon cancer - diabetic - hx seizures - hx cerebral infarction - hx chronic diastolic CHF - hx TEE without cardioversion - hx uretal stent placement  EXAM: CHEST  2 VIEW  COMPARISON:  01/16/2015  FINDINGS: Cardiac silhouette is normal in size and configuration. Aorta is mildly uncoiled. No mediastinal or hilar masses or evidence of adenopathy.  Clear lungs.  No pleural effusion or pneumothorax.  Bony thorax is demineralized but intact.  IMPRESSION: No active cardiopulmonary disease.   Electronically Signed   By: Lajean Manes M.D.   On: 05/03/2015 12:01   Ct Renal Stone Study  04/22/2015   CLINICAL DATA:  74 year old female with back pain for the past 4 days.  EXAM: CT ABDOMEN AND PELVIS WITHOUT CONTRAST  TECHNIQUE: Multidetector CT imaging of the abdomen and pelvis was performed following the standard protocol without IV contrast.  COMPARISON:  CT the abdomen and pelvis 02/07/2015.  FINDINGS: Lower chest:  Unremarkable.  Hepatobiliary: No definite cystic or solid hepatic lesions are identified on today's noncontrast CT examination. Numerous calcified gallstones lying dependently in the gallbladder. No current findings to suggest an acute cholecystitis at this time.  Pancreas: No definite pancreatic mass or peripancreatic inflammatory changes on today's noncontrast CT examination.  Spleen: Unremarkable.   Adrenals/Urinary Tract: Tiny nonobstructive calculi in the right renal collecting system, largest of which measures 3 mm in the upper pole. No additional calculi are identified within the left renal collecting system, along the course of either ureter or within the lumen of the urinary bladder. No hydroureteronephrosis to suggest urinary tract obstruction at this time. Mild bilateral perinephric stranding (nonspecific). The unenhanced appearance of the kidneys is otherwise unremarkable. Urinary bladder is nearly completely decompressed with a Foley balloon catheter in position.  Stomach/Bowel: The unenhanced appearance of the stomach is normal. No pathologic dilatation of small bowel or colon. Mass-like thickening of the distal rectum, asymmetric (right greater than left) where the rectal wall measures up to 1.7 cm in thickness.  Vascular/Lymphatic: Atherosclerosis throughout the abdominal and pelvic vasculature, without evidence of aneurysm. No lymphadenopathy noted in the abdomen or pelvis on today's noncontrast CT examination.  Reproductive: Status post hysterectomy. Ovaries are unremarkable in appearance.  Other: No significant volume of ascites.  No pneumoperitoneum.  Musculoskeletal: There are no aggressive appearing lytic or blastic lesions noted in the visualized portions of the skeleton.  IMPRESSION: 1. No acute findings in the abdomen or pelvis to account for the  patient's symptoms. 2. There are small nonobstructive calculi in the right renal collecting system measuring up to 3 mm in the upper pole. No ureteral stones or findings of urinary tract obstruction are noted at this time. 3. Calcified gallstones are present gallbladder, but there is no evidence of acute cholecystitis at this time. 4. Mass-like thickening of the distal rectum, asymmetric (involving the right lateral wall to a greater extent than the rest), concerning for potential rectal neoplasm. Correlation with nonemergent physical examination  and sigmoidoscopy is strongly suggested in the near future. 5. Atherosclerosis. 6. Additional incidental findings, as above.   Electronically Signed   By: Vinnie Langton M.D.   On: 04/22/2015 19:40     CBC  Recent Labs Lab 05/04/15 0507  WBC 13.2*  HGB 10.9*  HCT 34.4*  PLT 408*  MCV 91.7  MCH 29.1  MCHC 31.7  RDW 15.0    Chemistries   Recent Labs Lab 05/03/15 1500 05/04/15 0507  NA 141 141  K 3.8 4.2  CL 106 108  CO2 26 24  GLUCOSE 169* 256*  BUN 13 15  CREATININE 0.76 0.86  CALCIUM 8.9 8.8*  AST 18  --   ALT 13*  --   ALKPHOS 173*  --   BILITOT 0.3  --    ------------------------------------------------------------------------------------------------------------------ estimated creatinine clearance is 43.2 mL/min (by C-G formula based on Cr of 0.86). ------------------------------------------------------------------------------------------------------------------  Recent Labs  05/03/15 1500  HGBA1C 7.3*   ------------------------------------------------------------------------------------------------------------------ No results for input(s): CHOL, HDL, LDLCALC, TRIG, CHOLHDL, LDLDIRECT in the last 72 hours. ------------------------------------------------------------------------------------------------------------------ No results for input(s): TSH, T4TOTAL, T3FREE, THYROIDAB in the last 72 hours.  Invalid input(s): FREET3 ------------------------------------------------------------------------------------------------------------------ No results for input(s): VITAMINB12, FOLATE, FERRITIN, TIBC, IRON, RETICCTPCT in the last 72 hours.  Coagulation profile No results for input(s): INR, PROTIME in the last 168 hours.  No results for input(s): DDIMER in the last 72 hours.  Cardiac Enzymes No results for input(s): CKMB, TROPONINI, MYOGLOBIN in the last 168 hours.  Invalid input(s):  CK ------------------------------------------------------------------------------------------------------------------ Invalid input(s): POCBNP   CBG:  Recent Labs Lab 05/03/15 1435 05/03/15 1727 05/03/15 2135 05/04/15 0757 05/04/15 1200  GLUCAP 155* 209* 180* 211* 162*       Studies: Dg Chest 2 View  05/03/2015   CLINICAL DATA:  Pre op removal of colon cancer - diabetic - hx seizures - hx cerebral infarction - hx chronic diastolic CHF - hx TEE without cardioversion - hx uretal stent placement  EXAM: CHEST  2 VIEW  COMPARISON:  01/16/2015  FINDINGS: Cardiac silhouette is normal in size and configuration. Aorta is mildly uncoiled. No mediastinal or hilar masses or evidence of adenopathy.  Clear lungs.  No pleural effusion or pneumothorax.  Bony thorax is demineralized but intact.  IMPRESSION: No active cardiopulmonary disease.   Electronically Signed   By: Lajean Manes M.D.   On: 05/03/2015 12:01      Lab Results  Component Value Date   HGBA1C 7.3* 05/03/2015   HGBA1C 7.4* 01/16/2015   HGBA1C 10.1* 10/22/2014   Lab Results  Component Value Date   MICROALBUR 12.22* 05/06/2013   LDLCALC 105* 10/22/2014   CREATININE 0.86 05/04/2015       Scheduled Meds: . alvimopan  12 mg Oral BID  . atorvastatin  20 mg Oral q1800  . carvedilol  3.125 mg Oral BID WC  . enoxaparin (LOVENOX) injection  40 mg Subcutaneous Q24H  . furosemide  40 mg Oral Daily  . glimepiride  2 mg Oral Q  breakfast  . Influenza vac split quadrivalent PF  0.5 mL Intramuscular Tomorrow-1000  . insulin aspart  0-5 Units Subcutaneous QHS  . insulin aspart  0-9 Units Subcutaneous TID WC  . insulin detemir  10 Units Subcutaneous Daily  . isosorbide dinitrate  5 mg Oral TID  . levETIRAcetam  500 mg Oral BID  . pantoprazole  40 mg Oral Daily  . potassium chloride SA  40 mEq Oral Daily   Continuous Infusions: . dextrose 5 % and 0.45 % NaCl with KCl 20 mEq/L 75 mL/hr at 05/04/15 6728    Active Problems:    Major depression   HYPERTENSION, BENIGN SYSTEMIC   Cerebral thrombosis with cerebral infarction   Uncontrolled type 2 diabetes mellitus with insulin therapy   Hyperlipidemia   Seizure disorder   Chronic diastolic CHF (congestive heart failure), grade I   Neuroendocrine carcinoma    Time spent: 45 minutes   [facility] Pager [phone]. If 7PM-7AM, please contact night-coverage at [email or web address], password [facility] 05/04/2015, 2:46 PM  LOS: 1 day

## 2015-05-04 NOTE — Clinical Social Work Note (Signed)
Clinical Social Work Assessment  Patient Details  Name: April [name] MRN: [id number] Date of Birth: Dec 21, 1940  Date of referral:  05/04/15               Reason for consult:  Discharge Planning                Permission sought to share information with:  Family Supports Permission granted to share information::  Yes, Verbal Permission Granted  Name::     [name]::     Relationship::  niece  Contact Information:  [phone]  Housing/Transportation Living arrangements for the past 2 months:  [place] of Information:  Patient Patient Interpreter Needed:  None Criminal Activity/Legal Involvement Pertinent to Current Situation/Hospitalization:  No - Comment as needed Significant Relationships:  Other(Comment) (pt nieces) Lives with:  Facility Resident Do you feel safe going back to the place where you live?  Yes Need for family participation in patient care:  Yes (Comment) (pt niece at bedside)  Care giving concerns:  Pt admitted from [facility] and plans to return.   Social Worker assessment / plan:  CSW received referral that pt admitted from Pt admitted from [facility] and plans to return.  CSW met with pt and pt niece at bedside. Pt reports that she is doing well postoperatively. Pt confirmed that she is a resident at [facility] and plans to return. Pt states that MD stated that discharge will not be until Monday or Tuesday. Pt niece states that she can transport pt when medically ready for discharge.  CSW completed FL2 and sent clinicals to [facility]. CSW spoke with [place] at facility and facility agrees that it will be best for pt to return on Monday given pt is only one day post op.   CSW to continue to follow to provide support and assist with pt transition back to [facility].  Employment status:  Retired Nurse, adult PT  Recommendations:  Home with [place] / Referral to community resources:  Other (Comment Required) (Referral back to [facility])  Patient/Family's Response to care:  Pt alert and oriented x 4. Pt pleasant and actively engaged in conversation. Pt pleased at how well she is doing one day post op  Patient/Family's Understanding of and Emotional Response to Diagnosis, Current Treatment, and Prognosis:  Pt displayed understanding of surgery that she had and states that MD anticipates discharge Monday or Tuesday.  Emotional Assessment Appearance:  Appears stated age Attitude/Demeanor/Rapport:  Other (pt appropriate) Affect (typically observed):  Appropriate Orientation:  Oriented to Self, Oriented to Place, Oriented to  Time, Oriented to Situation Alcohol / Substance use:  Not Applicable Psych involvement (Current and /or in the community):  No (Comment)  Discharge Needs  Concerns to be addressed:  Discharge Planning Concerns Readmission within the last 30 days:  No Current discharge risk:  None Barriers to Discharge:  Continued Medical Work up   Alison Murray A, [place] 05/04/2015, 6:49 PM  [phone]

## 2015-05-05 DIAGNOSIS — C7A8 Other malignant neuroendocrine tumors: Secondary | ICD-10-CM | POA: Diagnosis not present

## 2015-05-05 LAB — CBC
HCT: 31 % — ABNORMAL LOW (ref 36.0–46.0)
Hemoglobin: 10 g/dL — ABNORMAL LOW (ref 12.0–15.0)
MCH: 29.7 pg (ref 26.0–34.0)
MCHC: 32.3 g/dL (ref 30.0–36.0)
MCV: 92 fL (ref 78.0–100.0)
PLATELETS: 359 10*3/uL (ref 150–400)
RBC: 3.37 MIL/uL — AB (ref 3.87–5.11)
RDW: 14.9 % (ref 11.5–15.5)
WBC: 13.5 10*3/uL — AB (ref 4.0–10.5)

## 2015-05-05 LAB — BASIC METABOLIC PANEL
ANION GAP: 5 (ref 5–15)
BUN: 11 mg/dL (ref 6–20)
CALCIUM: 8.7 mg/dL — AB (ref 8.9–10.3)
CO2: 28 mmol/L (ref 22–32)
CREATININE: 0.63 mg/dL (ref 0.44–1.00)
Chloride: 108 mmol/L (ref 101–111)
Glucose, Bld: 115 mg/dL — ABNORMAL HIGH (ref 65–99)
Potassium: 4.2 mmol/L (ref 3.5–5.1)
SODIUM: 141 mmol/L (ref 135–145)

## 2015-05-05 LAB — GLUCOSE, CAPILLARY
GLUCOSE-CAPILLARY: 87 mg/dL (ref 65–99)
GLUCOSE-CAPILLARY: 122 mg/dL — AB (ref 65–99)
Glucose-Capillary: 92 mg/dL (ref 65–99)
Glucose-Capillary: 192 mg/dL — ABNORMAL HIGH (ref 65–99)

## 2015-05-05 NOTE — Progress Notes (Signed)
Got patient from Junior, RN around 2230. Agree with assessment.

## 2015-05-05 NOTE — Progress Notes (Signed)
Progress Note: General Surgery Service   Subjective: Pain controlled, no n/v, +BM today  Objective: Vital signs in last 24 hours: Temp:  [98.3 F (36.8 C)-99.4 F (37.4 C)] 99.4 F (37.4 C) (09/17 0528) Pulse Rate:  [80-86] 80 (09/17 0528) Resp:  [18] 18 (09/17 0528) BP: (127-176)/(57-74) 141/70 mmHg (09/17 0528) SpO2:  [94 %-96 %] 94 % (09/17 0528) Last BM Date: 05/02/15  Intake/Output from previous day: 09/16 0701 - 09/17 0700 In: 2560 [P.O.:720; I.V.:1840] Out: 2850 [Urine:2850] Intake/Output this shift:    Lungs: CTAB  Abd: soft, attp, nd, wound c/d/i  Extremities: no edema  Neuro: AOx4  Lab Results: CBC   Recent Labs  05/04/15 0507 05/05/15 0458  WBC 13.2* 13.5*  HGB 10.9* 10.0*  HCT 34.4* 31.0*  PLT 408* 359   BMET  Recent Labs  05/04/15 0507 05/05/15 0458  NA 141 141  K 4.2 4.2  CL 108 108  CO2 24 28  GLUCOSE 256* 115*  BUN 15 11  CREATININE 0.86 0.63  CALCIUM 8.8* 8.7*   PT/INR No results for input(s): LABPROT, INR in the last 72 hours. ABG No results for input(s): PHART, HCO3 in the last 72 hours.  Invalid input(s): PCO2, PO2  Studies/Results: Dg Chest 2 View  05/03/2015   CLINICAL DATA:  Pre op removal of colon cancer - diabetic - hx seizures - hx cerebral infarction - hx chronic diastolic CHF - hx TEE without cardioversion - hx uretal stent placement  EXAM: CHEST  2 VIEW  COMPARISON:  01/16/2015  FINDINGS: Cardiac silhouette is normal in size and configuration. Aorta is mildly uncoiled. No mediastinal or hilar masses or evidence of adenopathy.  Clear lungs.  No pleural effusion or pneumothorax.  Bony thorax is demineralized but intact.  IMPRESSION: No active cardiopulmonary disease.   Electronically Signed   By: Lajean Manes M.D.   On: 05/03/2015 12:01    Anti-infectives: Anti-infectives    Start     Dose/Rate Route Frequency Ordered Stop   05/03/15 2200  cefoTEtan (CEFOTAN) 2 g in dextrose 5 % 50 mL IVPB     2 g 100 mL/hr over  30 Minutes Intravenous Every 12 hours 05/03/15 1634 05/03/15 2152   05/03/15 0951  cefoTEtan (CEFOTAN) 2 g in dextrose 5 % 50 mL IVPB     2 g 100 mL/hr over 30 Minutes Intravenous On call to O.R. 05/03/15 0951 05/03/15 1219      Medicaions: Scheduled Meds: . alvimopan  12 mg Oral BID  . atorvastatin  20 mg Oral q1800  . carvedilol  3.125 mg Oral BID WC  . enoxaparin (LOVENOX) injection  40 mg Subcutaneous Q24H  . furosemide  40 mg Oral Daily  . glimepiride  2 mg Oral Q breakfast  . Influenza vac split quadrivalent PF  0.5 mL Intramuscular Tomorrow-1000  . insulin aspart  0-5 Units Subcutaneous QHS  . insulin aspart  0-9 Units Subcutaneous TID WC  . insulin detemir  10 Units Subcutaneous Daily  . isosorbide dinitrate  5 mg Oral TID  . levETIRAcetam  500 mg Oral BID  . pantoprazole  40 mg Oral Daily  . potassium chloride SA  40 mEq Oral Daily   Continuous Infusions: . dextrose 5 % and 0.45 % NaCl with KCl 20 mEq/L 75 mL/hr at 05/04/15 2117   PRN Meds:.diphenhydrAMINE **OR** diphenhydrAMINE, hydrALAZINE, metoprolol, morphine injection, ondansetron **OR** ondansetron (ZOFRAN) IV  Assessment/Plan: Patient Active Problem List   Diagnosis Date Noted  . Neuroendocrine carcinoma  05/03/2015  . Pyelonephritis 02/08/2015  . Sepsis 02/08/2015  . Hydroureteronephrosis 02/08/2015  . Ureterolithiasis 02/08/2015  . Diarrhea 02/08/2015  . ARF (acute renal failure) 02/08/2015  . Chronic diastolic CHF (congestive heart failure), grade I 01/18/2015  . Uncontrolled type 2 diabetes mellitus with insulin therapy 01/17/2015  . Hyperlipidemia 01/17/2015  . Cerebrovascular disease 01/17/2015  . Seizure disorder 01/17/2015  . Cerebral thrombosis with cerebral infarction 10/22/2014  . Aphasia 10/21/2014  . Retinopathy 06/06/2013  . GERD (gastroesophageal reflux disease) 05/06/2013  . Major depression 10/15/2006  . HYPERTENSION, BENIGN SYSTEMIC 10/15/2006   s/p Procedure(s): LAPAROSCOPIC  PARTIAL COLECTOMY Advance diet Decrease ivf Ambulate Oral pain control  LOS: 2 days   Mickeal Skinner, MD Pg# [phone] [place] [facility]

## 2015-05-05 NOTE — Progress Notes (Signed)
[facility] PROGRESS NOTE  April [name] MLY:[id number] DOB: September 07, 1940 DOA: 05/03/2015 PCP: Durenda Age, NP  Assessment/Plan: Active Problems:   Major depression   HYPERTENSION, BENIGN SYSTEMIC   Cerebral thrombosis with cerebral infarction   Uncontrolled type 2 diabetes mellitus with insulin therapy   Hyperlipidemia   Seizure disorder   Chronic diastolic CHF (congestive heart failure), grade I   Neuroendocrine carcinoma   Subjective-patient had a BM today, feels well  Status post colectomy-admitted under Dr. Marcello Moores, started on clear liquid diet by surgery, follow elevated white count, advanced at by surgery  Seizure disorder-continue Keppra, patient has remained seizure-free since being started on Keppra, no breakthrough seizures  History of CVA/TIA, restart aspirin IF surgery recommends to do so from a bleeding risk standpoint   Hypertension-continue Coreg, prn hydralazine. If needed for systolic greater than 812, pain control will help bring the blood pressure down   Major depression-continue outpatient medications   Hyperlipidemia-continue statin   Chronic diastolic CHF (congestive heart failure) without exacerbation, grade I-continue Lasix. Renal function stable   Neuroendocrine carcinoma-followed by general surgery  Diabetes mellitus-hemoglobin A1c 7.3 continue sliding scale insulin/Lantus, stable CBG, patient takes Levemir 10 units and Amaryl at home  DVT prophylaxsis   Code Status:      Code Status Orders        Start     Ordered   05/03/15 1635  Full code   Continuous     05/03/15 1634    Advance Directive Documentation        Most Recent Value   Type of Advance Directive  Healthcare Power of Attorney, Living will   Pre-existing out of facility DNR order (yellow form or pink MOST form)     "MOST" Form in Place?       Family Communication: family updated about patient's clinical progress Disposition Plan:  per  surgery   Brief narrative: 74 year old female with a history of hypertension, dyslipidemia, diabetes, A1c of 10.7, seizure disorder, recently diagnosed with a neuroendocrine tumor status post laparoscopic partial colectomy by Dr. Marcello Moores today, seen in the PACU for medical management. Patient recently diagnosed with seizure disorder in March 2016 and started on Keppra. Recent echocardiogramEchocardiogram ejection fraction 60%, ultrasound of carotid artery showed less than 39% stenosis, anterograde flow of bilateral vertebral system. Patient had an acute ischemic infarct in March 2015. She is a poorly controlled diabetic, last LDL was 46. Recent TSH B-12 within normal limits. Currently lives at [facility], followed by [facility] neurology for history of CVA, memory difficulties and confusion. Diagnosed with a new colonic mass and seen today after surgery for a neuroendocrine tumor. She is hypertensive in the PACU   Consultants:  Internal medicine  Procedures: Post-Op Lap R colectomy  Antibiotics: Anti-infectives    Start     Dose/Rate Route Frequency Ordered Stop   05/03/15 2200  cefoTEtan (CEFOTAN) 2 g in dextrose 5 % 50 mL IVPB     2 g 100 mL/hr over 30 Minutes Intravenous Every 12 hours 05/03/15 1634 05/03/15 2152   05/03/15 0951  cefoTEtan (CEFOTAN) 2 g in dextrose 5 % 50 mL IVPB     2 g 100 mL/hr over 30 Minutes Intravenous On call to O.R. 05/03/15 0951 05/03/15 1219      Objective: Filed Vitals:   05/04/15 0554 05/04/15 1412 05/04/15 2120 05/05/15 0528  BP: 153/75 176/74 127/57 141/70  Pulse: 93 84 86 80  Temp: 98.8 F (37.1 C) 98.8 F (37.1  C) 98.3 F (36.8 C) 99.4 F (37.4 C)  TempSrc: Oral Oral Oral Oral  Resp: 17 18 18 18   Height:      Weight:      SpO2: 98% 96% 95% 94%    Intake/Output Summary (Last 24 hours) at 05/05/15 1158 Last data filed at 05/05/15 1124  Gross per 24 hour  Intake   2560 ml  Output   3400 ml  Net   -840 ml    Exam:  General: No  acute respiratory distress Lungs: Clear to auscultation bilaterally without wheezes or crackles Cardiovascular: Regular rate and rhythm without murmur gallop or rub normal S1 and S2 Abdomen: Nontender, nondistended, soft, bowel sounds positive, no rebound, no ascites, no appreciable mass Extremities: No significant cyanosis, clubbing, or edema bilateral lower extremities     Data Review   Micro Results No results found for this or any previous visit (from the past 240 hour(s)).  Radiology Reports Dg Chest 2 View  05/03/2015   CLINICAL DATA:  Pre op removal of colon cancer - diabetic - hx seizures - hx cerebral infarction - hx chronic diastolic CHF - hx TEE without cardioversion - hx uretal stent placement  EXAM: CHEST  2 VIEW  COMPARISON:  01/16/2015  FINDINGS: Cardiac silhouette is normal in size and configuration. Aorta is mildly uncoiled. No mediastinal or hilar masses or evidence of adenopathy.  Clear lungs.  No pleural effusion or pneumothorax.  Bony thorax is demineralized but intact.  IMPRESSION: No active cardiopulmonary disease.   Electronically Signed   By: Lajean Manes M.D.   On: 05/03/2015 12:01   Ct Renal Stone Study  04/22/2015   CLINICAL DATA:  74 year old female with back pain for the past 4 days.  EXAM: CT ABDOMEN AND PELVIS WITHOUT CONTRAST  TECHNIQUE: Multidetector CT imaging of the abdomen and pelvis was performed following the standard protocol without IV contrast.  COMPARISON:  CT the abdomen and pelvis 02/07/2015.  FINDINGS: Lower chest:  Unremarkable.  Hepatobiliary: No definite cystic or solid hepatic lesions are identified on today's noncontrast CT examination. Numerous calcified gallstones lying dependently in the gallbladder. No current findings to suggest an acute cholecystitis at this time.  Pancreas: No definite pancreatic mass or peripancreatic inflammatory changes on today's noncontrast CT examination.  Spleen: Unremarkable.  Adrenals/Urinary Tract: Tiny  nonobstructive calculi in the right renal collecting system, largest of which measures 3 mm in the upper pole. No additional calculi are identified within the left renal collecting system, along the course of either ureter or within the lumen of the urinary bladder. No hydroureteronephrosis to suggest urinary tract obstruction at this time. Mild bilateral perinephric stranding (nonspecific). The unenhanced appearance of the kidneys is otherwise unremarkable. Urinary bladder is nearly completely decompressed with a Foley balloon catheter in position.  Stomach/Bowel: The unenhanced appearance of the stomach is normal. No pathologic dilatation of small bowel or colon. Mass-like thickening of the distal rectum, asymmetric (right greater than left) where the rectal wall measures up to 1.7 cm in thickness.  Vascular/Lymphatic: Atherosclerosis throughout the abdominal and pelvic vasculature, without evidence of aneurysm. No lymphadenopathy noted in the abdomen or pelvis on today's noncontrast CT examination.  Reproductive: Status post hysterectomy. Ovaries are unremarkable in appearance.  Other: No significant volume of ascites.  No pneumoperitoneum.  Musculoskeletal: There are no aggressive appearing lytic or blastic lesions noted in the visualized portions of the skeleton.  IMPRESSION: 1. No acute findings in the abdomen or pelvis to account for the patient's  symptoms. 2. There are small nonobstructive calculi in the right renal collecting system measuring up to 3 mm in the upper pole. No ureteral stones or findings of urinary tract obstruction are noted at this time. 3. Calcified gallstones are present gallbladder, but there is no evidence of acute cholecystitis at this time. 4. Mass-like thickening of the distal rectum, asymmetric (involving the right lateral wall to a greater extent than the rest), concerning for potential rectal neoplasm. Correlation with nonemergent physical examination and sigmoidoscopy is strongly  suggested in the near future. 5. Atherosclerosis. 6. Additional incidental findings, as above.   Electronically Signed   By: Vinnie Langton M.D.   On: 04/22/2015 19:40     CBC  Recent Labs Lab 05/04/15 0507 05/05/15 0458  WBC 13.2* 13.5*  HGB 10.9* 10.0*  HCT 34.4* 31.0*  PLT 408* 359  MCV 91.7 92.0  MCH 29.1 29.7  MCHC 31.7 32.3  RDW 15.0 14.9    Chemistries   Recent Labs Lab 05/03/15 1500 05/04/15 0507 05/05/15 0458  NA 141 141 141  K 3.8 4.2 4.2  CL 106 108 108  CO2 26 24 28   GLUCOSE 169* 256* 115*  BUN 13 15 11   CREATININE 0.76 0.86 0.63  CALCIUM 8.9 8.8* 8.7*  AST 18  --   --   ALT 13*  --   --   ALKPHOS 173*  --   --   BILITOT 0.3  --   --    ------------------------------------------------------------------------------------------------------------------ estimated creatinine clearance is 46.5 mL/min (by C-G formula based on Cr of 0.63). ------------------------------------------------------------------------------------------------------------------  Recent Labs  05/03/15 1500  HGBA1C 7.3*   ------------------------------------------------------------------------------------------------------------------ No results for input(s): CHOL, HDL, LDLCALC, TRIG, CHOLHDL, LDLDIRECT in the last 72 hours. ------------------------------------------------------------------------------------------------------------------ No results for input(s): TSH, T4TOTAL, T3FREE, THYROIDAB in the last 72 hours.  Invalid input(s): FREET3 ------------------------------------------------------------------------------------------------------------------ No results for input(s): VITAMINB12, FOLATE, FERRITIN, TIBC, IRON, RETICCTPCT in the last 72 hours.  Coagulation profile No results for input(s): INR, PROTIME in the last 168 hours.  No results for input(s): DDIMER in the last 72 hours.  Cardiac Enzymes No results for input(s): CKMB, TROPONINI, MYOGLOBIN in the last 168  hours.  Invalid input(s): CK ------------------------------------------------------------------------------------------------------------------ Invalid input(s): POCBNP   CBG:  Recent Labs Lab 05/04/15 0757 05/04/15 1200 05/04/15 1653 05/04/15 2127 05/05/15 0810  GLUCAP 211* 162* 152* 129* 87       Studies: No results found.    Lab Results  Component Value Date   HGBA1C 7.3* 05/03/2015   HGBA1C 7.4* 01/16/2015   HGBA1C 10.1* 10/22/2014   Lab Results  Component Value Date   MICROALBUR 12.22* 05/06/2013   LDLCALC 105* 10/22/2014   CREATININE 0.63 05/05/2015       Scheduled Meds: . alvimopan  12 mg Oral BID  . atorvastatin  20 mg Oral q1800  . carvedilol  3.125 mg Oral BID WC  . enoxaparin (LOVENOX) injection  40 mg Subcutaneous Q24H  . furosemide  40 mg Oral Daily  . glimepiride  2 mg Oral Q breakfast  . Influenza vac split quadrivalent PF  0.5 mL Intramuscular Tomorrow-1000  . insulin aspart  0-5 Units Subcutaneous QHS  . insulin aspart  0-9 Units Subcutaneous TID WC  . insulin detemir  10 Units Subcutaneous Daily  . isosorbide dinitrate  5 mg Oral TID  . levETIRAcetam  500 mg Oral BID  . pantoprazole  40 mg Oral Daily  . potassium chloride SA  40 mEq Oral Daily   Continuous Infusions: .  dextrose 5 % and 0.45 % NaCl with KCl 20 mEq/L 10 mL/hr at 05/05/15 1011    Active Problems:   Major depression   HYPERTENSION, BENIGN SYSTEMIC   Cerebral thrombosis with cerebral infarction   Uncontrolled type 2 diabetes mellitus with insulin therapy   Hyperlipidemia   Seizure disorder   Chronic diastolic CHF (congestive heart failure), grade I   Neuroendocrine carcinoma    Time spent: 45 minutes   [facility] Pager [phone]. If 7PM-7AM, please contact night-coverage at [email or web address], password [facility] 05/05/2015, 11:58 AM  LOS: 2 days

## 2015-05-05 NOTE — Progress Notes (Signed)
Nurse tech Gerald Stabs called patient's niece to let her know that patient was transferred into room 1414.

## 2015-05-06 LAB — GLUCOSE, CAPILLARY
GLUCOSE-CAPILLARY: 166 mg/dL — AB (ref 65–99)
GLUCOSE-CAPILLARY: 116 mg/dL — AB (ref 65–99)
Glucose-Capillary: 121 mg/dL — ABNORMAL HIGH (ref 65–99)
Glucose-Capillary: 146 mg/dL — ABNORMAL HIGH (ref 65–99)

## 2015-05-06 LAB — BASIC METABOLIC PANEL
ANION GAP: 10 (ref 5–15)
BUN: 12 mg/dL (ref 6–20)
CHLORIDE: 103 mmol/L (ref 101–111)
CO2: 30 mmol/L (ref 22–32)
Calcium: 9.2 mg/dL (ref 8.9–10.3)
Creatinine, Ser: 0.62 mg/dL (ref 0.44–1.00)
GFR calc Af Amer: >60 mL/min (ref >60)
GLUCOSE: 125 mg/dL — AB (ref 65–99)
POTASSIUM: 3.7 mmol/L (ref 3.5–5.1)
SODIUM: 143 mmol/L (ref 135–145)

## 2015-05-06 LAB — CBC
HEMATOCRIT: 35.8 % — AB (ref 36.0–46.0)
HEMOGLOBIN: 11.7 g/dL — AB (ref 12.0–15.0)
MCH: 29.9 pg (ref 26.0–34.0)
MCHC: 32.7 g/dL (ref 30.0–36.0)
MCV: 91.6 fL (ref 78.0–100.0)
Platelets: 382 10*3/uL (ref 150–400)
RBC: 3.91 MIL/uL (ref 3.87–5.11)
RDW: 14.9 % (ref 11.5–15.5)
WBC: 11.8 10*3/uL — AB (ref 4.0–10.5)

## 2015-05-06 MED ORDER — INSULIN DETEMIR 100 UNIT/ML ~~LOC~~ SOLN
15.0000 [IU] | Freq: Every day | SUBCUTANEOUS | Status: DC
  Administered 2015-05-07: 15 [IU] via SUBCUTANEOUS
  Filled 2015-05-06: qty 0.15

## 2015-05-06 NOTE — Progress Notes (Signed)
Occupational Therapy Treatment Patient Details Name: BERNADETT MILIAN MRN: [id number] DOB: 06/01/1941 Today's Date: 05/06/2015    History of present illness The patient is a 74 year old female who presents with an abdominal mass. Pt recently hospitalized due to an obstructing kidney stone and sepsis. Pt underwent laparoscopic R colectomy on 05/03/15 due to neuroendocrine tumor. Pt wtih PMH of seizure disorder, recent CVA,and HTN. Pt d/c from hospital after CVA to SNF for rehab and then to ALF   OT comments  Pt needs assistance with bed mobility and sitting balance when she first gets up.  Pt doesn't feel she can manage at ALF as she is today.    Follow Up Recommendations  SNF --Feel pt would benefit from this prior to back to ALF initially, due to limited support   Equipment Recommendations  3 in 1 bedside comode (if she doesn't have)    Recommendations for Other Services      Precautions / Restrictions Precautions Precautions: Fall Precaution Comments: abdominal incision Restrictions Weight Bearing Restrictions: No       Mobility Bed Mobility   Bed Mobility: Rolling;Sidelying to Sit;Sit to Supine Rolling: Supervision (using rail) Sidelying to sit: Min assist   Sit to supine: Min assist      Transfers   Equipment used: Rolling walker (2 wheeled) Transfers: Sit to/from Stand Sit to Stand: Min assist         General transfer comment: min A to rise; cues for UE placement    Balance           Standing balance support: Bilateral upper extremity supported Standing balance-Leahy Scale: Poor Standing balance comment: able to release one hand for dressing while using RW                   ADL       Grooming: Supervision/safety;Sitting               Lower Body Dressing: Minimal assistance;Sit to/from stand                 General ADL Comments: pt needed min A for bed mobility using bed rail with HOB raised 20 degrees.  initially very  unsteady with LOB to L when sitting EOB performing LB adls.  Pt has very limited assistance at [place] per pt      Vision                     Perception     Praxis      Cognition   Behavior During Therapy: [facility] for tasks assessed/performed Overall Cognitive Status: Within Functional Limits for tasks assessed                       Extremity/Trunk Assessment               Exercises     Shoulder Instructions       General Comments      Pertinent Vitals/ Pain       Pain Assessment: 0-10 Pain Score: 5  Pain Location: abdomen Pain Descriptors / Indicators: Sore Pain Intervention(s): Limited activity within patient's tolerance;Monitored during session;Premedicated before session;Patient requesting pain meds-RN notified  Home Living                                          Prior Functioning/Environment  Frequency Min 2X/week     Progress Toward Goals  OT Goals(current goals can now be found in the care plan section)  Progress towards OT goals: Progressing toward goals     Plan Discharge plan needs to be updated    Co-evaluation                 End of Session     Activity Tolerance Patient tolerated treatment well   Patient Left in bed;with call bell/phone within reach;with bed alarm set   Nurse Communication Patient requests pain meds; also about change of recommendation to SNF        Time: 2947-6546 OT Time Calculation (min): 20 min  Charges: OT General Charges $OT Visit: 1 Procedure OT Treatments $Self Care/Home Management : 8-22 mins  SPENCER,MARYELLEN 05/06/2015, 9:29 AM  Lesle Chris, OTR/L [phone] 05/06/2015

## 2015-05-06 NOTE — Progress Notes (Signed)
3 Days Post-Op  Subjective: Requiring assistance to do most things.  Tolerating diet.  Bowels moving.  Objective: Vital signs in last 24 hours: Temp:  [98.2 F (36.8 C)-98.7 F (37.1 C)] 98.2 F (36.8 C) (09/18 0542) Pulse Rate:  [84-88] 84 (09/18 0542) Resp:  [12-16] 12 (09/18 0542) BP: (120-151)/(69-71) 120/69 mmHg (09/18 0542) SpO2:  [95 %] 95 % (09/18 0542) Last BM Date: 05/05/15  Intake/Output from previous day: 09/17 0701 - 09/18 0700 In: 1467.9 [P.O.:480; I.V.:512.9] Out: 2901 [Urine:2900; Stool:1] Intake/Output this shift:    PE: General- In NAD Abdomen-soft, incision clean and intact  Lab Results:   Recent Labs  05/05/15 0458 05/06/15 0504  WBC 13.5* 11.8*  HGB 10.0* 11.7*  HCT 31.0* 35.8*  PLT 359 382   BMET  Recent Labs  05/05/15 0458 05/06/15 0504  NA 141 143  K 4.2 3.7  CL 108 103  CO2 28 30  GLUCOSE 115* 125*  BUN 11 12  CREATININE 0.63 0.62  CALCIUM 8.7* 9.2   PT/INR No results for input(s): LABPROT, INR in the last 72 hours. Comprehensive Metabolic Panel:    Component Value Date/Time   NA 143 05/06/2015 0504   NA 141 05/05/2015 0458   K 3.7 05/06/2015 0504   K 4.2 05/05/2015 0458   CL 103 05/06/2015 0504   CL 108 05/05/2015 0458   CO2 30 05/06/2015 0504   CO2 28 05/05/2015 0458   BUN 12 05/06/2015 0504   BUN 11 05/05/2015 0458   CREATININE 0.62 05/06/2015 0504   CREATININE 0.63 05/05/2015 0458   CREATININE 0.52 05/17/2013 1144   CREATININE 0.60 05/06/2013 1459   GLUCOSE 125* 05/06/2015 0504   GLUCOSE 115* 05/05/2015 0458   CALCIUM 9.2 05/06/2015 0504   CALCIUM 8.7* 05/05/2015 0458   AST 18 05/03/2015 1500   AST 13* 02/08/2015 0335   ALT 13* 05/03/2015 1500   ALT 12* 02/08/2015 0335   ALKPHOS 173* 05/03/2015 1500   ALKPHOS 87 02/08/2015 0335   BILITOT 0.3 05/03/2015 1500   BILITOT 0.3 02/08/2015 0335   PROT 7.8 05/03/2015 1500   PROT 6.7 02/08/2015 0335   ALBUMIN 3.6 05/03/2015 1500   ALBUMIN 2.8* 02/08/2015 0335      Studies/Results: No results found.  Anti-infectives: Anti-infectives    Start     Dose/Rate Route Frequency Ordered Stop   05/03/15 2200  cefoTEtan (CEFOTAN) 2 g in dextrose 5 % 50 mL IVPB     2 g 100 mL/hr over 30 Minutes Intravenous Every 12 hours 05/03/15 1634 05/03/15 2152   05/03/15 0951  cefoTEtan (CEFOTAN) 2 g in dextrose 5 % 50 mL IVPB     2 g 100 mL/hr over 30 Minutes Intravenous On call to O.R. 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  Other Problems:   Major depression   HYPERTENSION, BENIGN SYSTEMIC   Cerebral thrombosis with cerebral infarction   Uncontrolled type 2 diabetes mellitus with insulin therapy- well controlled with current regimen   Hyperlipidemia   Seizure disorder   Chronic diastolic CHF (congestive heart failure), grade I       LOS: 3 days   Plan: Will need SNF.   ROSENBOWER,TODD J 05/06/2015

## 2015-05-06 NOTE — Progress Notes (Addendum)
[facility] PROGRESS NOTE  April [name] WEX:[id number] DOB: June 08, 1941 DOA: 05/03/2015 PCP: Durenda Age, NP  Assessment/Plan: Active Problems:   Major depression   HYPERTENSION, BENIGN SYSTEMIC   Cerebral thrombosis with cerebral infarction   Uncontrolled type 2 diabetes mellitus with insulin therapy   Hyperlipidemia   Seizure disorder   Chronic diastolic CHF (congestive heart failure), grade I   Neuroendocrine carcinoma   Subjective-tearful about the thought of being discharged, otherwise no complaints, had a BM last night   Assessment and plan  Status post colectomy-medically stable post surgery, admitted under Dr. Marcello Moores, now on a carb modified diet   Seizure disorder-continue Keppra, patient has remained seizure-free since being started on Keppra, no breakthrough seizures  History of CVA/TIA, restart aspirin IF surgery recommends to do so from a bleeding risk standpoint   Hypertension-continue Coreg, prn hydralazine. If needed for systolic greater than 938, pain control will help bring the blood pressure down   Major depression-patient tearful today but refuses to receive anti-anxiety medications. She states she's just anxious about the discharge plan   Hyperlipidemia-continue statin   Chronic diastolic CHF (congestive heart failure) without exacerbation, grade I-continue Lasix. Renal function stable   Neuroendocrine carcinoma-followed by general surgery  Diabetes mellitus-hemoglobin A1c 7.3 continue sliding scale insulin/Lantus, stable CBG, increase Lantus to 15 units, continue Amaryl  DVT prophylaxsis Lovenox  Code Status:      Code Status Orders        Start     Ordered   05/03/15 1635  Full code   Continuous     05/03/15 1634    Advance Directive Documentation        Most Recent Value   Type of Advance Directive  Healthcare Power of Attorney, Living will   Pre-existing out of facility DNR order (yellow form or pink MOST  form)     "MOST" Form in Place?       Family Communication: family updated about patient's clinical progress Disposition Plan:  per surgery   Brief narrative: 74 year old female with a history of hypertension, dyslipidemia, diabetes, A1c of 10.7, seizure disorder, recently diagnosed with a neuroendocrine tumor status post laparoscopic partial colectomy by Dr. Marcello Moores today, seen in the PACU for medical management. Patient recently diagnosed with seizure disorder in March 2016 and started on Keppra. Recent echocardiogramEchocardiogram ejection fraction 60%, ultrasound of carotid artery showed less than 39% stenosis, anterograde flow of bilateral vertebral system. Patient had an acute ischemic infarct in March 2015. She is a poorly controlled diabetic, last LDL was 46. Recent TSH B-12 within normal limits. Currently lives at [facility], followed by [facility] neurology for history of CVA, memory difficulties and confusion. Diagnosed with a new colonic mass and seen today after surgery for a neuroendocrine tumor. She is hypertensive in the PACU   Consultants:  Internal medicine  Procedures: Post-Op Lap R colectomy  Antibiotics: Anti-infectives    Start     Dose/Rate Route Frequency Ordered Stop   05/03/15 2200  cefoTEtan (CEFOTAN) 2 g in dextrose 5 % 50 mL IVPB     2 g 100 mL/hr over 30 Minutes Intravenous Every 12 hours 05/03/15 1634 05/03/15 2152   05/03/15 0951  cefoTEtan (CEFOTAN) 2 g in dextrose 5 % 50 mL IVPB     2 g 100 mL/hr over 30 Minutes Intravenous On call to O.R. 05/03/15 0951 05/03/15 1219      Objective: Filed Vitals:   05/05/15 0528 05/05/15 1424 05/05/15 2206 05/06/15  0542  BP: 141/70 151/71 131/69 120/69  Pulse: 80 88 85 84  Temp: 99.4 F (37.4 C) 98.7 F (37.1 C) 98.6 F (37 C) 98.2 F (36.8 C)  TempSrc: Oral Oral Oral Oral  Resp: 18 16 16 12   Height:      Weight:      SpO2: 94% 95% 95% 95%    Intake/Output Summary (Last 24 hours) at 05/06/15  1106 Last data filed at 05/06/15 0900  Gross per 24 hour  Intake 1014.17 ml  Output   2051 ml  Net -1036.83 ml    Exam:  General: No acute respiratory distress Lungs: Clear to auscultation bilaterally without wheezes or crackles Cardiovascular: Regular rate and rhythm without murmur gallop or rub normal S1 and S2 Abdomen: Nontender, nondistended, soft, bowel sounds positive, no rebound, no ascites, no appreciable mass Extremities: No significant cyanosis, clubbing, or edema bilateral lower extremities     Data Review   Micro Results No results found for this or any previous visit (from the past 240 hour(s)).  Radiology Reports Dg Chest 2 View  05/03/2015   CLINICAL DATA:  Pre op removal of colon cancer - diabetic - hx seizures - hx cerebral infarction - hx chronic diastolic CHF - hx TEE without cardioversion - hx uretal stent placement  EXAM: CHEST  2 VIEW  COMPARISON:  01/16/2015  FINDINGS: Cardiac silhouette is normal in size and configuration. Aorta is mildly uncoiled. No mediastinal or hilar masses or evidence of adenopathy.  Clear lungs.  No pleural effusion or pneumothorax.  Bony thorax is demineralized but intact.  IMPRESSION: No active cardiopulmonary disease.   Electronically Signed   By: Lajean Manes M.D.   On: 05/03/2015 12:01   Ct Renal Stone Study  04/22/2015   CLINICAL DATA:  74 year old female with back pain for the past 4 days.  EXAM: CT ABDOMEN AND PELVIS WITHOUT CONTRAST  TECHNIQUE: Multidetector CT imaging of the abdomen and pelvis was performed following the standard protocol without IV contrast.  COMPARISON:  CT the abdomen and pelvis 02/07/2015.  FINDINGS: Lower chest:  Unremarkable.  Hepatobiliary: No definite cystic or solid hepatic lesions are identified on today's noncontrast CT examination. Numerous calcified gallstones lying dependently in the gallbladder. No current findings to suggest an acute cholecystitis at this time.  Pancreas: No definite pancreatic  mass or peripancreatic inflammatory changes on today's noncontrast CT examination.  Spleen: Unremarkable.  Adrenals/Urinary Tract: Tiny nonobstructive calculi in the right renal collecting system, largest of which measures 3 mm in the upper pole. No additional calculi are identified within the left renal collecting system, along the course of either ureter or within the lumen of the urinary bladder. No hydroureteronephrosis to suggest urinary tract obstruction at this time. Mild bilateral perinephric stranding (nonspecific). The unenhanced appearance of the kidneys is otherwise unremarkable. Urinary bladder is nearly completely decompressed with a Foley balloon catheter in position.  Stomach/Bowel: The unenhanced appearance of the stomach is normal. No pathologic dilatation of small bowel or colon. Mass-like thickening of the distal rectum, asymmetric (right greater than left) where the rectal wall measures up to 1.7 cm in thickness.  Vascular/Lymphatic: Atherosclerosis throughout the abdominal and pelvic vasculature, without evidence of aneurysm. No lymphadenopathy noted in the abdomen or pelvis on today's noncontrast CT examination.  Reproductive: Status post hysterectomy. Ovaries are unremarkable in appearance.  Other: No significant volume of ascites.  No pneumoperitoneum.  Musculoskeletal: There are no aggressive appearing lytic or blastic lesions noted in the visualized portions of  the skeleton.  IMPRESSION: 1. No acute findings in the abdomen or pelvis to account for the patient's symptoms. 2. There are small nonobstructive calculi in the right renal collecting system measuring up to 3 mm in the upper pole. No ureteral stones or findings of urinary tract obstruction are noted at this time. 3. Calcified gallstones are present gallbladder, but there is no evidence of acute cholecystitis at this time. 4. Mass-like thickening of the distal rectum, asymmetric (involving the right lateral wall to a greater extent  than the rest), concerning for potential rectal neoplasm. Correlation with nonemergent physical examination and sigmoidoscopy is strongly suggested in the near future. 5. Atherosclerosis. 6. Additional incidental findings, as above.   Electronically Signed   By: Vinnie Langton M.D.   On: 04/22/2015 19:40     CBC  Recent Labs Lab 05/04/15 0507 05/05/15 0458 05/06/15 0504  WBC 13.2* 13.5* 11.8*  HGB 10.9* 10.0* 11.7*  HCT 34.4* 31.0* 35.8*  PLT 408* 359 382  MCV 91.7 92.0 91.6  MCH 29.1 29.7 29.9  MCHC 31.7 32.3 32.7  RDW 15.0 14.9 14.9    Chemistries   Recent Labs Lab 05/03/15 1500 05/04/15 0507 05/05/15 0458 05/06/15 0504  NA 141 141 141 143  K 3.8 4.2 4.2 3.7  CL 106 108 108 103  CO2 26 24 28 30   GLUCOSE 169* 256* 115* 125*  BUN 13 15 11 12   CREATININE 0.76 0.86 0.63 0.62  CALCIUM 8.9 8.8* 8.7* 9.2  AST 18  --   --   --   ALT 13*  --   --   --   ALKPHOS 173*  --   --   --   BILITOT 0.3  --   --   --    ------------------------------------------------------------------------------------------------------------------ estimated creatinine clearance is 46.5 mL/min (by C-G formula based on Cr of 0.62). ------------------------------------------------------------------------------------------------------------------  Recent Labs  05/03/15 1500  HGBA1C 7.3*   ------------------------------------------------------------------------------------------------------------------ No results for input(s): CHOL, HDL, LDLCALC, TRIG, CHOLHDL, LDLDIRECT in the last 72 hours. ------------------------------------------------------------------------------------------------------------------ No results for input(s): TSH, T4TOTAL, T3FREE, THYROIDAB in the last 72 hours.  Invalid input(s): FREET3 ------------------------------------------------------------------------------------------------------------------ No results for input(s): VITAMINB12, FOLATE, FERRITIN, TIBC, IRON,  RETICCTPCT in the last 72 hours.  Coagulation profile No results for input(s): INR, PROTIME in the last 168 hours.  No results for input(s): DDIMER in the last 72 hours.  Cardiac Enzymes No results for input(s): CKMB, TROPONINI, MYOGLOBIN in the last 168 hours.  Invalid input(s): CK ------------------------------------------------------------------------------------------------------------------ Invalid input(s): POCBNP   CBG:  Recent Labs Lab 05/05/15 0810 05/05/15 1121 05/05/15 1635 05/05/15 2251 05/06/15 0743  GLUCAP 87 122* 92 192* 121*       Studies: No results found.    Lab Results  Component Value Date   HGBA1C 7.3* 05/03/2015   HGBA1C 7.4* 01/16/2015   HGBA1C 10.1* 10/22/2014   Lab Results  Component Value Date   MICROALBUR 12.22* 05/06/2013   LDLCALC 105* 10/22/2014   CREATININE 0.62 05/06/2015       Scheduled Meds: . atorvastatin  20 mg Oral q1800  . carvedilol  3.125 mg Oral BID WC  . enoxaparin (LOVENOX) injection  40 mg Subcutaneous Q24H  . furosemide  40 mg Oral Daily  . glimepiride  2 mg Oral Q breakfast  . insulin aspart  0-5 Units Subcutaneous QHS  . insulin aspart  0-9 Units Subcutaneous TID WC  . insulin detemir  10 Units Subcutaneous Daily  . isosorbide dinitrate  5 mg Oral TID  .  levETIRAcetam  500 mg Oral BID  . pantoprazole  40 mg Oral Daily  . potassium chloride SA  40 mEq Oral Daily   Continuous Infusions: . dextrose 5 % and 0.45 % NaCl with KCl 20 mEq/L 10 mL/hr at 05/05/15 1011    Active Problems:   Major depression   HYPERTENSION, BENIGN SYSTEMIC   Cerebral thrombosis with cerebral infarction   Uncontrolled type 2 diabetes mellitus with insulin therapy   Hyperlipidemia   Seizure disorder   Chronic diastolic CHF (congestive heart failure), grade I   Neuroendocrine carcinoma    Time spent: 45 minutes   [facility] Pager [phone]. If 7PM-7AM, please contact night-coverage at  [email or web address], password [facility] 05/06/2015, 11:06 AM  LOS: 3 days

## 2015-05-06 NOTE — Progress Notes (Signed)
Pharmacy Brief Note - Alvimopan (Entereg)  The standing order set for alvimopan (Entereg) now includes an automatic order to discontinue the drug after the patient has had a bowel movement.  The change was approved by the [facility] and the Medical Executive Committee.    This patient has had a bowel movement documented by nursing.  Therefore, alvimopan has been discontinued.  If there are questions, please contact the pharmacy at [phone].  Thank you- Biagio Borg, [facility] 05/06/2015 7:35 AM

## 2015-05-07 ENCOUNTER — Inpatient Hospital Stay (HOSPITAL_COMMUNITY): Payer: Medicare HMO

## 2015-05-07 DIAGNOSIS — R69 Illness, unspecified: Secondary | ICD-10-CM | POA: Diagnosis not present

## 2015-05-07 DIAGNOSIS — E089 Diabetes mellitus due to underlying condition without complications: Secondary | ICD-10-CM | POA: Diagnosis not present

## 2015-05-07 DIAGNOSIS — R1310 Dysphagia, unspecified: Secondary | ICD-10-CM | POA: Diagnosis not present

## 2015-05-07 DIAGNOSIS — I10 Essential (primary) hypertension: Secondary | ICD-10-CM | POA: Diagnosis not present

## 2015-05-07 DIAGNOSIS — R404 Transient alteration of awareness: Secondary | ICD-10-CM | POA: Diagnosis not present

## 2015-05-07 DIAGNOSIS — I5030 Unspecified diastolic (congestive) heart failure: Secondary | ICD-10-CM | POA: Diagnosis not present

## 2015-05-07 DIAGNOSIS — G40909 Epilepsy, unspecified, not intractable, without status epilepticus: Secondary | ICD-10-CM

## 2015-05-07 DIAGNOSIS — I6330 Cerebral infarction due to thrombosis of unspecified cerebral artery: Secondary | ICD-10-CM | POA: Diagnosis not present

## 2015-05-07 DIAGNOSIS — E1165 Type 2 diabetes mellitus with hyperglycemia: Secondary | ICD-10-CM

## 2015-05-07 DIAGNOSIS — E1149 Type 2 diabetes mellitus with other diabetic neurological complication: Secondary | ICD-10-CM | POA: Diagnosis not present

## 2015-05-07 DIAGNOSIS — C7A8 Other malignant neuroendocrine tumors: Secondary | ICD-10-CM | POA: Diagnosis not present

## 2015-05-07 DIAGNOSIS — E1140 Type 2 diabetes mellitus with diabetic neuropathy, unspecified: Secondary | ICD-10-CM | POA: Diagnosis not present

## 2015-05-07 DIAGNOSIS — I639 Cerebral infarction, unspecified: Secondary | ICD-10-CM | POA: Diagnosis not present

## 2015-05-07 DIAGNOSIS — K219 Gastro-esophageal reflux disease without esophagitis: Secondary | ICD-10-CM | POA: Diagnosis not present

## 2015-05-07 DIAGNOSIS — E785 Hyperlipidemia, unspecified: Secondary | ICD-10-CM | POA: Diagnosis not present

## 2015-05-07 DIAGNOSIS — I679 Cerebrovascular disease, unspecified: Secondary | ICD-10-CM | POA: Diagnosis not present

## 2015-05-07 DIAGNOSIS — Z8673 Personal history of transient ischemic attack (TIA), and cerebral infarction without residual deficits: Secondary | ICD-10-CM | POA: Diagnosis not present

## 2015-05-07 DIAGNOSIS — I5032 Chronic diastolic (congestive) heart failure: Secondary | ICD-10-CM | POA: Diagnosis not present

## 2015-05-07 LAB — GLUCOSE, CAPILLARY
GLUCOSE-CAPILLARY: 196 mg/dL — AB (ref 65–99)
Glucose-Capillary: 129 mg/dL — ABNORMAL HIGH (ref 65–99)
Glucose-Capillary: 136 mg/dL — ABNORMAL HIGH (ref 65–99)

## 2015-05-07 MED ORDER — ASPIRIN EC 325 MG PO TBEC
325.0000 mg | DELAYED_RELEASE_TABLET | Freq: Every day | ORAL | Status: DC
  Administered 2015-05-07: 325 mg via ORAL
  Filled 2015-05-07: qty 1

## 2015-05-07 MED ORDER — BISACODYL 10 MG RE SUPP: 10.0000 mg | Freq: Every day | RECTAL | Status: AC | PRN

## 2015-05-07 MED ORDER — TRAMADOL HCL 50 MG PO TABS: 50.0000 mg | ORAL_TABLET | Freq: Four times a day (QID) | ORAL | Status: DC | PRN

## 2015-05-07 MED ORDER — TRAMADOL HCL 50 MG PO TABS
50.0000 mg | ORAL_TABLET | Freq: Four times a day (QID) | ORAL | Status: DC | PRN
  Administered 2015-05-07: 50 mg via ORAL
  Filled 2015-05-07: qty 1

## 2015-05-07 NOTE — Progress Notes (Signed)
Physical Therapy Treatment Patient Details Name: April [name] MRN: [id number] DOB: 12/31/1940 Today's Date: 05/07/2015    History of Present Illness The patient is a 74 year old female who presents with an abdominal mass. Pt recently hospitalized due to an obstructing kidney stone and sepsis. Pt underwent laparoscopic R colectomy on 05/03/15 due to neuroendocrine tumor. Pt wtih PMH of seizure disorder, recent CVA,and HTN. Pt d/c from hospital after CVA to SNF for rehab and then to ALF    PT Comments    Pt needed a little more assistance compared to last session of PT. Agree that pt would benefit from [facility] rehab stay.   Follow Up Recommendations  SNF     Equipment Recommendations  None recommended by PT    Recommendations for Other Services OT consult     Precautions / Restrictions Precautions Precautions: Fall Precaution Comments: abdominal incision Restrictions Weight Bearing Restrictions: No    Mobility  Bed Mobility Overal bed mobility: Needs Assistance Bed Mobility: Sit to Supine   Sit to supine: Min assist   General bed mobility comments: slow with transitions. small amount of assist for LEs onto bed  Transfers Overall transfer level: Needs assistance Equipment used: Rolling walker (2 wheeled) Transfers: Sit to/from Stand Sit to Stand: Min assist         General transfer comment: Assist to stabilize. VCS safety, hand placement.   Ambulation/Gait Ambulation/Gait assistance: Min assist Ambulation Distance (Feet): 60 Feet Assistive device: Rolling walker (2 wheeled)       General Gait Details: Assist to safely maneuver RW, stabilize pt. slow gait speed. Assist needed to guide walker to L side intemittently   Stairs            Wheelchair Mobility    Modified Rankin (Stroke Patients Only)       Balance     Sitting balance-Leahy Scale: Good     Standing balance support: Bilateral upper extremity supported;During functional  activity Standing balance-Leahy Scale: Poor                      Cognition Arousal/Alertness: Awake/alert Behavior During Therapy: WFL for tasks assessed/performed Overall Cognitive Status: Within Functional Limits for tasks assessed                      Exercises      General Comments        Pertinent Vitals/Pain Pain Assessment: Faces Pain Score: 4  Faces Pain Scale: Hurts a little bit Pain Location: abdomen Pain Descriptors / Indicators: Sore Pain Intervention(s): Monitored during session;Repositioned    Home Living                      Prior Function            PT Goals (current goals can now be found in the care plan section) Progress towards PT goals: Progressing toward goals    Frequency  Min 3X/week    PT Plan Current plan remains appropriate    Co-evaluation             End of Session Equipment Utilized During Treatment: Gait belt Activity Tolerance: Patient tolerated treatment well Patient left: in bed;with call bell/phone within reach;with family/visitor present     Time: 3354-5625 PT Time Calculation (min) (ACUTE ONLY): 11 min  Charges:  $Gait Training: 8-22 mins  G Codes:      Weston Anna, MPT Pager: [phone]

## 2015-05-07 NOTE — Progress Notes (Signed)
Occupational Therapy Treatment Patient Details Name: April [name] MRN: [id number] DOB: October 22, 1940 Today's Date: 05/07/2015    History of present illness The patient is a 74 year old female who presents with an abdominal mass. Pt recently hospitalized due to an obstructing kidney stone and sepsis. Pt underwent laparoscopic R colectomy on 05/03/15 due to neuroendocrine tumor. Pt wtih PMH of seizure disorder, recent CVA,and HTN. Pt d/c from hospital after CVA to SNF for rehab and then to ALF   OT comments  Pt limited by fatigue this session. Pt transferred into bathroom with walker and stood at the sink to brush teeth and comb hair, wash face and reported the need to sit down after this. Pt states she feels like she is overall weak. Have concerns about her returning to ALF and being alone at times. Recommend SNF or if she does return to ALF, recommend 24/7 initially for safety.    Follow Up Recommendations  SNF;Supervision/Assistance - 24 hour    Equipment Recommendations  3 in 1 bedside comode    Recommendations for Other Services      Precautions / Restrictions Precautions Precautions: Fall Precaution Comments: abdominal incision Restrictions Weight Bearing Restrictions: No       Mobility Bed Mobility Overal bed mobility: Needs Assistance Bed Mobility: Rolling;Sidelying to Sit Rolling: Supervision Sidelying to sit: Min guard       General bed mobility comments: slow with transitions. Encouraged no rail but pt did ultimately reach for rail to help with sideyling to sit.   Transfers Overall transfer level: Needs assistance Equipment used: Rolling walker (2 wheeled) Transfers: Sit to/from Stand Sit to Stand: Min guard         General transfer comment: close guard for safety as pt feeling weak. Cues for hand placement as pt trying to pull up on walker. Cues to reach back to sit.     Balance     Sitting balance-Leahy Scale: Good       Standing balance-Leahy  Scale: Fair                     ADL       Grooming: Wash/dry hands;Wash/dry face;Oral care;Brushing hair;Min guard;Standing                   Toilet Transfer: Min guard;Ambulation;RW Toilet Transfer Details (indicate cue type and reason): to bathroom and then to recliner.           General ADL Comments: Pt sound asleep with lights out when OT arrived. Pt able to arouse and participate in therapy. Pt reporting feeling very tired and worn out. Pt did transfer into bathroom with walker and performed grooming at the sink with min guard assist but after only 1-2 minutes, she requested to sit down or lie down due to fatigue. Pt states her head felt alittle groggy with sitting down but didnt report any visual changes. Strength equal in [place]. MD in room during session and OT informed her of concern wtih pt's limited activity tolerance. Feel pt would have a hard time managing ADL currently.       Vision                     Perception     Praxis      Cognition   Behavior During Therapy: [facility] for tasks assessed/performed Overall Cognitive Status: Within Functional Limits for tasks assessed  Extremity/Trunk Assessment               Exercises     Shoulder Instructions       General Comments      Pertinent Vitals/ Pain       Pain Assessment: 0-10 Pain Score: 4  Pain Location: abdomen Pain Descriptors / Indicators: Sore Pain Intervention(s): Patient requesting pain meds-RN notified;Repositioned;Monitored during session  Home Living                                          Prior Functioning/Environment              Frequency Min 2X/week     Progress Toward Goals  OT Goals(current goals can now be found in the care plan section)  Progress towards OT goals: Progressing toward goals     Plan Discharge plan remains appropriate    Co-evaluation                 End of Session Equipment  Utilized During Treatment: Rolling walker   Activity Tolerance Patient limited by fatigue   Patient Left in chair;with call bell/phone within reach;with chair alarm set;with family/visitor present   Nurse Communication          Time: 0925-1000 OT Time Calculation (min): 35 min  Charges: OT General Charges $OT Visit: 1 Procedure OT Treatments $Self Care/Home Management : 8-22 mins $Therapeutic Activity: 8-22 mins  Jules Schick  [phone] 05/07/2015, 10:17 AM

## 2015-05-07 NOTE — Progress Notes (Signed)
4 Days Post-Op Lap R colectomy Subjective: Requiring assistance to do most things.  Tolerating diet.  Bowels moving.  Objective: Vital signs in last 24 hours: Temp:  [98.4 F (36.9 C)-98.5 F (36.9 C)] 98.5 F (36.9 C) (09/19 0626) Pulse Rate:  [80-87] 80 (09/19 0626) Resp:  [16] 16 (09/19 0626) BP: (118-136)/(59-78) 136/78 mmHg (09/19 0626) SpO2:  [94 %-96 %] 94 % (09/19 0626) Last BM Date: 05/05/15  Intake/Output from previous day: 09/18 0701 - 09/19 0700 In: 469 [P.O.:300; I.V.:169] Out: 1050 [Urine:1050] Intake/Output this shift:    PE: General- In NAD Abdomen-soft, incision clean and intact  Lab Results:   Recent Labs  05/05/15 0458 05/06/15 0504  WBC 13.5* 11.8*  HGB 10.0* 11.7*  HCT 31.0* 35.8*  PLT 359 382   BMET  Recent Labs  05/05/15 0458 05/06/15 0504  NA 141 143  K 4.2 3.7  CL 108 103  CO2 28 30  GLUCOSE 115* 125*  BUN 11 12  CREATININE 0.63 0.62  CALCIUM 8.7* 9.2   PT/INR No results for input(s): LABPROT, INR in the last 72 hours. Comprehensive Metabolic Panel:    Component Value Date/Time   NA 143 05/06/2015 0504   NA 141 05/05/2015 0458   K 3.7 05/06/2015 0504   K 4.2 05/05/2015 0458   CL 103 05/06/2015 0504   CL 108 05/05/2015 0458   CO2 30 05/06/2015 0504   CO2 28 05/05/2015 0458   BUN 12 05/06/2015 0504   BUN 11 05/05/2015 0458   CREATININE 0.62 05/06/2015 0504   CREATININE 0.63 05/05/2015 0458   CREATININE 0.52 05/17/2013 1144   CREATININE 0.60 05/06/2013 1459   GLUCOSE 125* 05/06/2015 0504   GLUCOSE 115* 05/05/2015 0458   CALCIUM 9.2 05/06/2015 0504   CALCIUM 8.7* 05/05/2015 0458   AST 18 05/03/2015 1500   AST 13* 02/08/2015 0335   ALT 13* 05/03/2015 1500   ALT 12* 02/08/2015 0335   ALKPHOS 173* 05/03/2015 1500   ALKPHOS 87 02/08/2015 0335   BILITOT 0.3 05/03/2015 1500   BILITOT 0.3 02/08/2015 0335   PROT 7.8 05/03/2015 1500   PROT 6.7 02/08/2015 0335   ALBUMIN 3.6 05/03/2015 1500   ALBUMIN 2.8* 02/08/2015 0335      Studies/Results: No results found.  Anti-infectives: Anti-infectives    Start     Dose/Rate Route Frequency Ordered Stop   05/03/15 2200  cefoTEtan (CEFOTAN) 2 g in dextrose 5 % 50 mL IVPB     2 g 100 mL/hr over 30 Minutes Intravenous Every 12 hours 05/03/15 1634 05/03/15 2152   05/03/15 0951  cefoTEtan (CEFOTAN) 2 g in dextrose 5 % 50 mL IVPB     2 g 100 mL/hr over 30 Minutes Intravenous On call to O.R. 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  Other Problems:   Major depression   HYPERTENSION, BENIGN SYSTEMIC   Cerebral thrombosis with cerebral infarction   Uncontrolled type 2 diabetes mellitus with insulin therapy- well controlled with current regimen   Hyperlipidemia   Seizure disorder   Chronic diastolic CHF (congestive heart failure), grade I       LOS: 4 days   Plan: PO pain meds, cont diet, ambulate Will need 24h assistance at discharge Will discuss with SW  Ok to d/c today if this can be arranged   [place] Coupland C. 2/83/6629

## 2015-05-07 NOTE — Clinical Social Work Placement (Signed)
CSW spoke with [place] at [facility] and confirmed that they would not be able to provide 24 hour assistance as they are ALF. CSW provided SNF bed offers to patient & niece, [name] who accepted bed offer at [facility]. Awaiting [facility] authorization.     Raynaldo Opitz, [facility] Clinical Social Worker cell #: [phone]    CLINICAL SOCIAL WORK PLACEMENT  NOTE  Date:  05/07/2015  Patient Details  Name: April [name] MRN: [id number] Date of Birth: [birth year]  Clinical Social Work is seeking post-discharge placement for this patient at the [place] level of care (*CSW will initial, date and re-position this form in  chart as items are completed):  Yes   Patient/family provided with [place] Work Department's list of facilities offering this level of care within the geographic area requested by the patient (or if unable, by the patient's family).  Yes   Patient/family informed of their freedom to choose among providers that offer the needed level of care, that participate in Medicare, Medicaid or managed care program needed by the patient, have an available bed and are willing to accept the patient.  Yes   Patient/family informed of [facility]'s ownership interest in [facility] and [facility], as well as of the fact that they are under no obligation to receive care at these facilities.  PASRR submitted to EDS on 05/07/15     PASRR number received on 05/07/15     Existing PASRR number confirmed on       FL2 transmitted to all facilities in geographic area requested by pt/family on 05/07/15     FL2 transmitted to all facilities within larger geographic area on       Patient informed that his/her managed care company has contracts with or will negotiate with certain facilities, including the following:        Yes   Patient/family informed of bed offers  received.  Patient chooses bed at [facility]     Physician recommends and patient chooses bed at      Patient to be transferred to [facility] on  .  Patient to be transferred to facility by       Patient family notified on   of transfer.  Name of family member notified:        PHYSICIAN       Additional Comment:    _______________________________________________ Standley Brooking, LCSW 05/07/2015, 12:29 PM

## 2015-05-07 NOTE — Discharge Instructions (Signed)

## 2015-05-07 NOTE — Clinical Social Work Placement (Signed)
Patient is set to discharge to [facility] SNF today. Patient & patient's niece, April [name] at bedside aware. Discharge packet given to RN, Doroteo Bradford. Patient's niece, April [name] at bedside to transport to SNF.     Raynaldo Opitz, [facility] Clinical Social Worker cell #: [phone]    CLINICAL SOCIAL WORK PLACEMENT  NOTE  Date:  05/07/2015  Patient Details  Name: April [name] MRN: [id number] Date of Birth: 10-13-1940  Clinical Social Work is seeking post-discharge placement for this patient at the [place] level of care (*CSW will initial, date and re-position this form in  chart as items are completed):  Yes   Patient/family provided with [place] Work Department's list of facilities offering this level of care within the geographic area requested by the patient (or if unable, by the patient's family).  Yes   Patient/family informed of their freedom to choose among providers that offer the needed level of care, that participate in Medicare, Medicaid or managed care program needed by the patient, have an available bed and are willing to accept the patient.  Yes   Patient/family informed of 's ownership interest in [facility] and [facility], as well as of the fact that they are under no obligation to receive care at these facilities.  PASRR submitted to EDS on 05/07/15     PASRR number received on 05/07/15     Existing PASRR number confirmed on       FL2 transmitted to all facilities in geographic area requested by pt/family on 05/07/15     FL2 transmitted to all facilities within larger geographic area on       Patient informed that his/her managed care company has contracts with or will negotiate with certain facilities, including the following:        Yes   Patient/family informed of bed offers received.  Patient chooses bed at [place]     Physician  recommends and patient chooses bed at      Patient to be transferred to [facility] on 05/07/15.  Patient to be transferred to facility by patient's niece, April [name] at bedside to transport     Patient family notified on 05/07/15 of transfer.  Name of family member notified:  patient's niece, April [name] at bedside     PHYSICIAN       Additional Comment:    _______________________________________________ Standley Brooking, LCSW 05/07/2015, 4:09 PM

## 2015-05-07 NOTE — Discharge Summary (Signed)
Physician Discharge Summary  Patient ID: April [name] MRN: [id number] DOB/AGE: 1941-04-27 74 y.o.  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 Kidney Stone Diabetes Mellitus Cerebrovascular Accident Bladder Problems  Discharge Diagnoses:  SAME AS ABOVED  Principal Problem:   Neuroendocrine carcinoma Active Problems:   Major depression   Cerebral thrombosis with cerebral infarction   Uncontrolled type 2 diabetes mellitus with insulin therapy   Seizure disorder   Chronic diastolic CHF (congestive heart failure), grade I   HYPERTENSION, BENIGN SYSTEMIC   Hyperlipidemia   PROCEDURES: LAPAROSCOPIC RIGHT COLECTOMY Dr. [name], 3/81/01  Hospital Course:  The patient is a 74 year old female who presents with an abdominal mass. 74 year old female recently hospitalized due to an obstructing kidney stone and sepsis. CT scan obtained in the hospital showed a right-sided mesenteric mass concerning for malignancy or metastatic disease. She underwent a colonoscopy with Dr. Benson Norway and was found to have a terminal ileal mass. Biopsy showed neuroendocrine tumor. She is also having problems with her constipation and occasional rectal bleeding. She is taking Dulcolax on a daily basis as well as Senokot. Patient has a diagnosis of congestive heart failure but does not appear to be followed by any cardiologist. Patient also has a history of seizure disorder and CVA as well and currently resides in an assisted living facility. Problem List/Past Medical [name], MD; 7/51/0258 12:27 PM) NEUROENDOCRINE CARCINOMA OF SMALL BOWEL (209.00  C7A.8) She was admitted and taken to the OR on 05/03/15.  She is making good progress, she is tolerating a  diet and tolerating a diet.  Unfortunately she  needs ongoing assist which is not available at home. She has now ask for SNF placement and this was accomplished today.  Home health was arranged with her original plans for discharge and we will leave that information in the D/C so they can be contacted at discharge from SNF. I did not see the patient.         Disposition: SNF     Medication List    TAKE these medications        aspirin EC 325 MG tablet  Take 1 tablet (325 mg total) by mouth daily.     atorvastatin 20 MG tablet  Commonly known as:  LIPITOR  Take 1 tablet (20 mg total) by mouth daily at 6 PM.     bisacodyl 10 MG suppository  Commonly known as:  DULCOLAX  Place 1 suppository (10 mg total) rectally daily as needed for moderate constipation.     carvedilol 3.125 MG tablet  Commonly known as:  COREG  Take 1 tablet (3.125 mg total) by mouth 2 (two) times daily with a meal.     furosemide 40 MG tablet  Commonly known as:  LASIX  Take 1 tablet daily. If you gain > 3lbs in 1 day or 5 lbs in 1 week, take an extra dose.     glimepiride 2 MG tablet  Commonly known as:  AMARYL  Take 1 tablet (2 mg total) by mouth daily with breakfast.     insulin detemir 100 UNIT/ML injection  Commonly known as:  LEVEMIR  Inject 0.1 mLs (10 Units total) into the skin daily.     isosorbide dinitrate 5 MG tablet  Commonly known as:  ISORDIL  Take 1 tablet (5 mg total) by  mouth 3 (three) times daily. Take 1 tablet by mouth three times a day     levETIRAcetam 500 MG tablet  Commonly known as:  KEPPRA  Take 1 tablet (500 mg total) by mouth 2 (two) times daily.     methocarbamol 500 MG tablet  Commonly known as:  ROBAXIN  Take 1 tablet (500 mg total) by mouth once.     omeprazole 20 MG capsule  Commonly known as:  PRILOSEC  Take 20 mg by mouth daily.     ondansetron 8 MG disintegrating tablet  Commonly known as:  ZOFRAN ODT  Take 1 tablet (8 mg total) by mouth every 8 (eight)  hours as needed for nausea or vomiting.     potassium chloride SA 20 MEQ tablet  Commonly known as:  K-DUR,KLOR-CON  Take 2 tablets (40 mEq total) by mouth daily.     PRESCRIPTION MEDICATION  Beneprotein supplement- 2 scoops twice daily     saccharomyces boulardii 250 MG capsule  Commonly known as:  FLORASTOR  Take 1 capsule (250 mg total) by mouth 2 (two) times daily.     senna 8.6 MG Tabs tablet  Commonly known as:  SENOKOT  Take 1 tablet by mouth at bedtime.     traMADol 50 MG tablet  Commonly known as:  ULTRAM  Take 1-2 tablets (50-100 mg total) by mouth every 6 (six) hours as needed for moderate pain or severe pain.       Follow-up Information    Follow up with Rosario Adie., MD In 2 weeks.   Specialty:  General Surgery   Why:  or as scheduled   Contact information:   [address] [place] [address] [phone]       Follow up with [facility].   Specialty:  [place]   Why:  HHPT-[facility] after [place] services.   Contact information:   [place] [address] [phone]       Follow up with [facility].   Specialty:  Home Health Services   Why:  HHPT   Contact information:   [address] [place] [address] [phone]       Signed: Earnstine Regal 05/07/2015, 3:42 PM

## 2015-05-07 NOTE — Progress Notes (Addendum)
Report given to Nanda Quinton, RN at [facility] pt transported by niece via personal car.

## 2015-05-07 NOTE — Care Management Note (Signed)
Case Management Note  Patient Details  Name: April [name] MRN: [id number] Date of Birth: Jan 22, 1941  Subjective/Objective: Noted for SNF. CSW following w/[facility].Informed niece about possible SNF, back up if not covered HHPT,may need 3n1(not ordered)niece will p/u on own.[facility] updated. CSW following for auth.                  Action/Plan:d/c SNF   Expected Discharge Date:                  Expected Discharge Plan:  [place]  In-House Referral:  Clinical Social Work  Discharge planning Services  CM Consult  Post Acute Care Choice:    Choice offered to:     DME Arranged:    DME Agency:     HH Arranged:  PT [place]:  [place] ([place] Tanna [place]oy contract w/[facility]-HHPT, but [name] is active & will start HHPT, then [facility] will p/u after.)  Status of Service:  Completed, signed off  Medicare Important Message Given:  Yes-second notification given Date Medicare IM Given:    Medicare IM give by:    Date Additional Medicare IM Given:    Additional Medicare Important Message give by:     If discussed at [place] of Stay Meetings, dates discussed:    Additional Comments:  Dessa Phi, RN 05/07/2015, 11:51 AM

## 2015-05-07 NOTE — Care Management Important Message (Signed)
Important Message  Patient Details  Name: April [name] MRN: [id number] Date of Birth: 05/26/1941   Medicare Important Message Given:  Yes-second notification given    Camillo Flaming 05/07/2015, 11:00 AMImportant Message  Patient Details  Name: April [name] MRN: [id number] Date of Birth: 26-Oct-1940   Medicare Important Message Given:  Yes-second notification given    Camillo Flaming 05/07/2015, 11:00 AM

## 2015-05-07 NOTE — Progress Notes (Signed)
[facility] PROGRESS NOTE  April [name]:[id number] DOB: 08/03/1941 DOA: 05/03/2015 PCP: Durenda Age, NP  Assessment/Plan: Active Problems:   Major depression   HYPERTENSION, BENIGN SYSTEMIC   Cerebral thrombosis with cerebral infarction   Uncontrolled type 2 diabetes mellitus with insulin therapy   Hyperlipidemia   Seizure disorder   Chronic diastolic CHF (congestive heart failure), grade I   Neuroendocrine carcinoma   Subjective-patient complained of dizziness with ambulation with physical therapy, patient's speech is slurred, she also has a slight headache, recent history of CVA in March  Assessment and plan Slurred speech-ordered a stat MRI of the brain to rule out CVA, restart aspirin 325. Cannot rule out a TIA at this time  Status post colectomy-was stable post surgery, however symptoms concerning for TIA/CVA today   Seizure disorder-continue Keppra, patient has remained seizure-free since being started on Keppra, no breakthrough seizures  History of CVA/TIA, restart aspirin 325 mg a day   Hypertension-continue Coreg, prn hydralazine. If needed for systolic greater than 299, pain control will help bring the blood pressure down   Major depression-patient tearful today but refuses to receive anti-anxiety medications. She states she's just anxious about the discharge plan   Hyperlipidemia-continue statin   Chronic diastolic CHF (congestive heart failure) without exacerbation, grade I-continue Lasix. Renal function stable   Neuroendocrine carcinoma-followed by general surgery  Diabetes mellitus-hemoglobin A1c 7.3 continue sliding scale insulin/Lantus, stable CBG, increase Lantus to 15 units, continue Amaryl  DVT prophylaxsis Lovenox  Code Status:      Code Status Orders        Start     Ordered   05/03/15 1635  Full code   Continuous     05/03/15 1634    Advance Directive Documentation        Most Recent Value   Type of Advance  Directive  Healthcare Power of Attorney, Living will   Pre-existing out of facility DNR order (yellow form or pink MOST form)     "MOST" Form in Place?       Family Communication: family updated about patient's clinical progress Disposition Plan:  per surgery   Brief narrative: 74 year old female with a history of hypertension, dyslipidemia, diabetes, A1c of 10.7, seizure disorder, recently diagnosed with a neuroendocrine tumor status post laparoscopic partial colectomy by Dr. Marcello Moores today, seen in the PACU for medical management. Patient recently diagnosed with seizure disorder in March 2016 and started on Keppra. Recent echocardiogramEchocardiogram ejection fraction 60%, ultrasound of carotid artery showed less than 39% stenosis, anterograde flow of bilateral vertebral system. Patient had an acute ischemic infarct in March 2015. She is a poorly controlled diabetic, last LDL was 46. Recent TSH B-12 within normal limits. Currently lives at [facility], followed by [facility] neurology for history of CVA, memory difficulties and confusion. Diagnosed with a new colonic mass and seen today after surgery for a neuroendocrine tumor. She is hypertensive in the PACU   Consultants:  Internal medicine  Procedures: Post-Op Lap R colectomy  Antibiotics: Anti-infectives    Start     Dose/Rate Route Frequency Ordered Stop   05/03/15 2200  cefoTEtan (CEFOTAN) 2 g in dextrose 5 % 50 mL IVPB     2 g 100 mL/hr over 30 Minutes Intravenous Every 12 hours 05/03/15 1634 05/03/15 2152   05/03/15 0951  cefoTEtan (CEFOTAN) 2 g in dextrose 5 % 50 mL IVPB     2 g 100 mL/hr over 30 Minutes Intravenous On call to O.R.  05/03/15 0951 05/03/15 1219      Objective: Filed Vitals:   05/06/15 0542 05/06/15 1430 05/06/15 2038 05/07/15 0626  BP: 120/69 133/68 118/59 136/78  Pulse: 84 87 85 80  Temp: 98.2 F (36.8 C) 98.4 F (36.9 C) 98.4 F (36.9 C) 98.5 F (36.9 C)  TempSrc: Oral Oral Oral Oral  Resp: 12 16  16 16   Height:      Weight:      SpO2: 95% 96% 95% 94%    Intake/Output Summary (Last 24 hours) at 05/07/15 1104 Last data filed at 05/07/15 1030  Gross per 24 hour  Intake    969 ml  Output   1050 ml  Net    -81 ml    Exam:  General: No acute respiratory distress Lungs: Clear to auscultation bilaterally without wheezes or crackles Cardiovascular: Regular rate and rhythm without murmur gallop or rub normal S1 and S2 Abdomen: Nontender, nondistended, soft, bowel sounds positive, no rebound, no ascites, no appreciable mass Extremities: No significant cyanosis, clubbing, or edema bilateral lower extremities     Data Review   Micro Results No results found for this or any previous visit (from the past 240 hour(s)).  Radiology Reports Dg Chest 2 View  05/03/2015   CLINICAL DATA:  Pre op removal of colon cancer - diabetic - hx seizures - hx cerebral infarction - hx chronic diastolic CHF - hx TEE without cardioversion - hx uretal stent placement  EXAM: CHEST  2 VIEW  COMPARISON:  01/16/2015  FINDINGS: Cardiac silhouette is normal in size and configuration. Aorta is mildly uncoiled. No mediastinal or hilar masses or evidence of adenopathy.  Clear lungs.  No pleural effusion or pneumothorax.  Bony thorax is demineralized but intact.  IMPRESSION: No active cardiopulmonary disease.   Electronically Signed   By: Lajean Manes M.D.   On: 05/03/2015 12:01   Ct Renal Stone Study  04/22/2015   CLINICAL DATA:  74 year old female with back pain for the past 4 days.  EXAM: CT ABDOMEN AND PELVIS WITHOUT CONTRAST  TECHNIQUE: Multidetector CT imaging of the abdomen and pelvis was performed following the standard protocol without IV contrast.  COMPARISON:  CT the abdomen and pelvis 02/07/2015.  FINDINGS: Lower chest:  Unremarkable.  Hepatobiliary: No definite cystic or solid hepatic lesions are identified on today's noncontrast CT examination. Numerous calcified gallstones lying dependently in the  gallbladder. No current findings to suggest an acute cholecystitis at this time.  Pancreas: No definite pancreatic mass or peripancreatic inflammatory changes on today's noncontrast CT examination.  Spleen: Unremarkable.  Adrenals/Urinary Tract: Tiny nonobstructive calculi in the right renal collecting system, largest of which measures 3 mm in the upper pole. No additional calculi are identified within the left renal collecting system, along the course of either ureter or within the lumen of the urinary bladder. No hydroureteronephrosis to suggest urinary tract obstruction at this time. Mild bilateral perinephric stranding (nonspecific). The unenhanced appearance of the kidneys is otherwise unremarkable. Urinary bladder is nearly completely decompressed with a Foley balloon catheter in position.  Stomach/Bowel: The unenhanced appearance of the stomach is normal. No pathologic dilatation of small bowel or colon. Mass-like thickening of the distal rectum, asymmetric (right greater than left) where the rectal wall measures up to 1.7 cm in thickness.  Vascular/Lymphatic: Atherosclerosis throughout the abdominal and pelvic vasculature, without evidence of aneurysm. No lymphadenopathy noted in the abdomen or pelvis on today's noncontrast CT examination.  Reproductive: Status post hysterectomy. Ovaries are unremarkable in appearance.  Other: No significant volume of ascites.  No pneumoperitoneum.  Musculoskeletal: There are no aggressive appearing lytic or blastic lesions noted in the visualized portions of the skeleton.  IMPRESSION: 1. No acute findings in the abdomen or pelvis to account for the patient's symptoms. 2. There are small nonobstructive calculi in the right renal collecting system measuring up to 3 mm in the upper pole. No ureteral stones or findings of urinary tract obstruction are noted at this time. 3. Calcified gallstones are present gallbladder, but there is no evidence of acute cholecystitis at this  time. 4. Mass-like thickening of the distal rectum, asymmetric (involving the right lateral wall to a greater extent than the rest), concerning for potential rectal neoplasm. Correlation with nonemergent physical examination and sigmoidoscopy is strongly suggested in the near future. 5. Atherosclerosis. 6. Additional incidental findings, as above.   Electronically Signed   By: Vinnie Langton M.D.   On: 04/22/2015 19:40     CBC  Recent Labs Lab 05/04/15 0507 05/05/15 0458 05/06/15 0504  WBC 13.2* 13.5* 11.8*  HGB 10.9* 10.0* 11.7*  HCT 34.4* 31.0* 35.8*  PLT 408* 359 382  MCV 91.7 92.0 91.6  MCH 29.1 29.7 29.9  MCHC 31.7 32.3 32.7  RDW 15.0 14.9 14.9    Chemistries   Recent Labs Lab 05/03/15 1500 05/04/15 0507 05/05/15 0458 05/06/15 0504  NA 141 141 141 143  K 3.8 4.2 4.2 3.7  CL 106 108 108 103  CO2 26 24 28 30   GLUCOSE 169* 256* 115* 125*  BUN 13 15 11 12   CREATININE 0.76 0.86 0.63 0.62  CALCIUM 8.9 8.8* 8.7* 9.2  AST 18  --   --   --   ALT 13*  --   --   --   ALKPHOS 173*  --   --   --   BILITOT 0.3  --   --   --    ------------------------------------------------------------------------------------------------------------------ estimated creatinine clearance is 46.5 mL/min (by C-G formula based on Cr of 0.62). ------------------------------------------------------------------------------------------------------------------ No results for input(s): HGBA1C in the last 72 hours. ------------------------------------------------------------------------------------------------------------------ No results for input(s): CHOL, HDL, LDLCALC, TRIG, CHOLHDL, LDLDIRECT in the last 72 hours. ------------------------------------------------------------------------------------------------------------------ No results for input(s): TSH, T4TOTAL, T3FREE, THYROIDAB in the last 72 hours.  Invalid input(s):  FREET3 ------------------------------------------------------------------------------------------------------------------ No results for input(s): VITAMINB12, FOLATE, FERRITIN, TIBC, IRON, RETICCTPCT in the last 72 hours.  Coagulation profile No results for input(s): INR, PROTIME in the last 168 hours.  No results for input(s): DDIMER in the last 72 hours.  Cardiac Enzymes No results for input(s): CKMB, TROPONINI, MYOGLOBIN in the last 168 hours.  Invalid input(s): CK ------------------------------------------------------------------------------------------------------------------ Invalid input(s): POCBNP   CBG:  Recent Labs Lab 05/06/15 0743 05/06/15 1149 05/06/15 1607 05/06/15 2036 05/07/15 0742  GLUCAP 121* 166* 116* 146* 129*       Studies: No results found.    Lab Results  Component Value Date   HGBA1C 7.3* 05/03/2015   HGBA1C 7.4* 01/16/2015   HGBA1C 10.1* 10/22/2014   Lab Results  Component Value Date   MICROALBUR 12.22* 05/06/2013   LDLCALC 105* 10/22/2014   CREATININE 0.62 05/06/2015       Scheduled Meds: . atorvastatin  20 mg Oral q1800  . carvedilol  3.125 mg Oral BID WC  . enoxaparin (LOVENOX) injection  40 mg Subcutaneous Q24H  . furosemide  40 mg Oral Daily  . glimepiride  2 mg Oral Q breakfast  . insulin aspart  0-5 Units Subcutaneous QHS  . insulin  aspart  0-9 Units Subcutaneous TID WC  . insulin detemir  15 Units Subcutaneous Daily  . isosorbide dinitrate  5 mg Oral TID  . levETIRAcetam  500 mg Oral BID  . pantoprazole  40 mg Oral Daily  . potassium chloride SA  40 mEq Oral Daily   Continuous Infusions: . dextrose 5 % and 0.45 % NaCl with KCl 20 mEq/L 10 mL/hr at 05/05/15 1011    Active Problems:   Major depression   HYPERTENSION, BENIGN SYSTEMIC   Cerebral thrombosis with cerebral infarction   Uncontrolled type 2 diabetes mellitus with insulin therapy   Hyperlipidemia   Seizure disorder   Chronic diastolic CHF (congestive  heart failure), grade I   Neuroendocrine carcinoma    Time spent: 45 minutes   [facility] Pager [phone]. If 7PM-7AM, please contact night-coverage at [email or web address], password [facility] 05/07/2015, 11:04 AM  LOS: 4 days

## 2015-05-07 NOTE — Care Management Note (Signed)
Case Management Note  Patient Details  Name: April [name] MRN: [id number] Date of Birth: Jan 22, 1941  Subjective/Objective:  Spoke to spouse about d/c plans. Already active w/[facility]-[place] rep aware of d/c, & HHPT ordered. CSW following for ALF.                 Action/Plan:d/c plan return back to ALF.   Expected Discharge Date:                  Expected Discharge Plan:  Assisted Living / Rest Home  In-House Referral:  Clinical Social Work  Discharge planning Services  CM Consult  Post Acute Care Choice:    Choice offered to:     DME Arranged:    DME Agency:     HH Arranged:  PT [place]:  [facility] ([facility] [place]-has contract w/[facility]-HHPT, but [name] is active & will start HHPT, then [facility] will p/u after.)  Status of Service:  Completed, signed off  Medicare Important Message Given:  Yes-second notification given Date Medicare IM Given:    Medicare IM give by:    Date Additional Medicare IM Given:    Additional Medicare Important Message give by:     If discussed at [place] of Stay Meetings, dates discussed:    Additional Comments:  Dessa Phi, RN 05/07/2015, 11:43 AM

## 2015-05-07 NOTE — Care Management Note (Signed)
Case Management Note  Patient Details  Name: April [name] MRN: [id number] Date of Birth: [birth year]  Subjective/Objective:  TC [facility]-Spoke to [place]  About d/c back to ALF w/HHPT/24hr supv.They contract w/[facility] manager own. Provided patient w/private duty care list as resource(out of pocket expense). CSW following for ALF.               Action/Plan:d/c plan ALF-HHPT/24hr supv.   Expected Discharge Date:                  Expected Discharge Plan:  Assisted Living / Rest Home  In-House Referral:  Clinical Social Work  Discharge planning Services  CM Consult  Post Acute Care Choice:    Choice offered to:     DME Arranged:    DME Agency:     HH Arranged:  PT HH Agency:   ([facility]-ALF Administrator, [facility] w/[facility]-HHPT.)  Status of Service:  Completed, signed off  Medicare Important Message Given:    Date Medicare IM Given:    Medicare IM give by:    Date Additional Medicare IM Given:    Additional Medicare Important Message give by:     If discussed at [place] of Stay Meetings, dates discussed:    Additional Comments:  Dessa Phi, RN 05/07/2015, 9:33 AM

## 2015-05-08 ENCOUNTER — Encounter: Payer: Self-pay | Admitting: Adult Health

## 2015-05-08 ENCOUNTER — Non-Acute Institutional Stay (SKILLED_NURSING_FACILITY): Payer: Medicare HMO | Admitting: Adult Health

## 2015-05-08 DIAGNOSIS — E785 Hyperlipidemia, unspecified: Secondary | ICD-10-CM | POA: Diagnosis not present

## 2015-05-08 DIAGNOSIS — I10 Essential (primary) hypertension: Secondary | ICD-10-CM

## 2015-05-08 DIAGNOSIS — G40909 Epilepsy, unspecified, not intractable, without status epilepticus: Secondary | ICD-10-CM

## 2015-05-08 DIAGNOSIS — C7A8 Other malignant neuroendocrine tumors: Secondary | ICD-10-CM

## 2015-05-08 DIAGNOSIS — E1165 Type 2 diabetes mellitus with hyperglycemia: Secondary | ICD-10-CM

## 2015-05-08 DIAGNOSIS — R5381 Other malaise: Secondary | ICD-10-CM

## 2015-05-08 DIAGNOSIS — I5032 Chronic diastolic (congestive) heart failure: Secondary | ICD-10-CM

## 2015-05-08 DIAGNOSIS — I6330 Cerebral infarction due to thrombosis of unspecified cerebral artery: Secondary | ICD-10-CM | POA: Diagnosis not present

## 2015-05-08 DIAGNOSIS — IMO0002 Reserved for concepts with insufficient information to code with codable children: Secondary | ICD-10-CM

## 2015-05-08 DIAGNOSIS — K219 Gastro-esophageal reflux disease without esophagitis: Secondary | ICD-10-CM | POA: Diagnosis not present

## 2015-05-08 DIAGNOSIS — Z794 Long term (current) use of insulin: Secondary | ICD-10-CM

## 2015-05-08 NOTE — Progress Notes (Signed)
Patient ID: April [name], female   DOB: November 07, 1940, 74 y.o.   MRN: [id number]    Facility: [facility]      Allergies  Allergen Reactions  . Codeine Nausea And Vomiting  . Sulfa Antibiotics Nausea And Vomiting    Chief Complaint  Patient presents with  . Hospitalization Follow-up    HPI:  She was hospitalized for for obstructed renal stone with sepsis. She had a ct scan done which demonstrated a neuroendocrine carcinoma and underwent a laparoscopic partial colectomy. She is here for short term rehab and will then discharge. There are no nursing concerns at this time.    Past Medical History  Diagnosis Date  . Diabetes mellitus   . Hypertension   . Retinopathy 06/06/2013    Per eye exam 05/31/13 eye exam, Moderate retinopathy   . Aphasia   . Seizures   . Rectum bleeding   . HYPERLIPIDEMIA 10/15/2006    Qualifier: Diagnosis of  By: Beryle Lathe    . GERD (gastroesophageal reflux disease) 05/06/2013  . Gram-positive cocci bacteremia   . Seizure disorder, status epilepticus, nonconvulsive   . Cerebral infarction due to unspecified mechanism   . Uncontrolled type 2 diabetes mellitus with insulin therapy 01/17/2015  . Chronic diastolic CHF (congestive heart failure), grade I 01/18/2015  . Family history of adverse reaction to anesthesia   . Stroke 10/21/2014    Past Surgical History  Procedure Laterality Date  . Appendectomy  1972  . Abdominal hysterectomy  1972  . Tee without cardioversion N/A 10/24/2014    Procedure: TRANSESOPHAGEAL ECHOCARDIOGRAM (TEE);  Surgeon: Dorothy Spark, MD;  Location: [facility];  Service: Cardiovascular;  Laterality: N/A;  . Cystoscopy w/ ureteral stent placement Left 02/08/2015    Procedure: CYSTOSCOPY WITH RETROGRADE left PYELOGRAM/URETERAL STENT PLACEMENT left insertion of foley;  Surgeon: Franchot Gallo, MD;  Location: [facility];  Service: Urology;  Laterality: Left;  . Colonoscopy with propofol N/A 04/06/2015   Procedure: COLONOSCOPY WITH PROPOFOL;  Surgeon: Carol Ada, MD;  Location: [facility];  Service: Endoscopy;  Laterality: N/A;  . Left leg fracture      broken leg-left in 3 places  . Eye surgery  08/2014    bilateral cataract with lens implants  . Tonsillectomy      as child-age 50  . Laparoscopic partial colectomy N/A 05/03/2015    Procedure: LAPAROSCOPIC PARTIAL COLECTOMY;  Surgeon: [name], MD;  Location: [facility];  Service: General;  Laterality: N/A;    VITAL SIGNS BP 140/88 mmHg  Pulse 75  Ht '4\' 11"'  (1.499 m)  Wt 116 lb (52.617 kg)  BMI 23.42 kg/m2  LMP 08/18/1970  Patient's Medications  New Prescriptions   No medications on file  Previous Medications   AMINO ACIDS-PROTEIN HYDROLYS (FEEDING SUPPLEMENT, PRO-STAT SUGAR FREE 64,) LIQD    Take 30 mLs by mouth 2 (two) times daily.   ASPIRIN EC 325 MG TABLET    Take 1 tablet (325 mg total) by mouth daily.   ATORVASTATIN (LIPITOR) 20 MG TABLET    Take 1 tablet (20 mg total) by mouth daily at 6 PM.   BISACODYL (DULCOLAX) 10 MG SUPPOSITORY    Place 1 suppository (10 mg total) rectally daily as needed for moderate constipation.   CARVEDILOL (COREG) 3.125 MG TABLET    Take 1 tablet (3.125 mg total) by mouth 2 (two) times daily with a meal.   FUROSEMIDE (LASIX) 40 MG TABLET    Take 1 tablet daily. If you gain >  3lbs in 1 day or 5 lbs in 1 week, take an extra dose.   GLIMEPIRIDE (AMARYL) 2 MG TABLET    Take 1 tablet (2 mg total) by mouth daily with breakfast.   INSULIN DETEMIR (LEVEMIR) 100 UNIT/ML INJECTION    Inject 0.1 mLs (10 Units total) into the skin daily.   ISOSORBIDE DINITRATE (ISORDIL) 5 MG TABLET    Take 1 tablet (5 mg total) by mouth 3 (three) times daily. Take 1 tablet by mouth three times a day   LEVETIRACETAM (KEPPRA) 500 MG TABLET    Take 1 tablet (500 mg total) by mouth 2 (two) times daily.   METHOCARBAMOL (ROBAXIN) 500 MG TABLET    Take 1 tablet (500 mg total) by mouth daily.   OMEPRAZOLE (PRILOSEC) 20 MG CAPSULE     Take 20 mg by mouth daily.   ONDANSETRON (ZOFRAN ODT) 8 MG DISINTEGRATING TABLET    Take 1 tablet (8 mg total) by mouth every 8 (eight) hours as needed for nausea or vomiting.   POTASSIUM CHLORIDE SA (K-DUR,KLOR-CON) 20 MEQ TABLET    Take 2 tablets (40 mEq total) by mouth daily.   SACCHAROMYCES BOULARDII (FLORASTOR) 250 MG CAPSULE    Take 1 capsule (250 mg total) by mouth 2 (two) times daily.   SENNA (SENOKOT) 8.6 MG TABS TABLET    Take 1 tablet by mouth at bedtime.   TRAMADOL (ULTRAM) 50 MG TABLET    Take 1-2 tablets (50-100 mg total) by mouth every 6 (six) hours as needed for moderate pain or severe pain.  Modified Medications   No medications on file  Discontinued Medications   PRESCRIPTION MEDICATION    Beneprotein supplement- 2 scoops twice daily     SIGNIFICANT DIAGNOSTIC EXAMS  05-03-15: chest x-ray: No active cardiopulmonary disease.  05-07-15: mri of brain: No acute intracranial abnormality.  Chronic lacunar infarcts plus nonspecific but probable small vessel related signal changes in the cerebral white matter and cerebellar peduncle spirit   LABS REVIEWED:   05-03-15: glucose 169; bun 13; creat 0.76; k+ 3.8; na++141; alk phos 173; alt 13; ast 18; albumin 3.6; hgb a1c 7.3 05-04-15: wbc 13.2; hgb 10.9; hct 34.4; mcv 91.7; plt 408; glucose 256; bun 15; creat 0.86; k+ 4.2; na++141 05-06-15: wbc 11.8; hgb 11.7; hct 35.8; mcv 91.6; plt 382; glucose 125; bun 12; creat 0.62; k+ 3.7; na++143       Review of Systems  Constitutional: Negative for appetite change and fatigue.  HENT: Negative for congestion.   Respiratory: Negative for cough, chest tightness and shortness of breath.   Cardiovascular: Negative for chest pain, palpitations and leg swelling.  Gastrointestinal: Negative for nausea, abdominal pain, diarrhea and constipation.  Musculoskeletal: Negative for myalgias and arthralgias.  Skin: Negative for pallor.  Neurological: Negative for dizziness.  Psychiatric/Behavioral:  The patient is not nervous/anxious.       Physical Exam  Constitutional: No distress.  Frail   Eyes: Conjunctivae are normal.  Neck: Neck supple. No JVD present. No thyromegaly present.  Cardiovascular: Normal rate, regular rhythm and intact distal pulses.   Respiratory: Effort normal and breath sounds normal. No respiratory distress. She has no wheezes.  GI: Soft. Bowel sounds are normal. She exhibits no distension. There is no tenderness.  Musculoskeletal: She exhibits no edema.  Able to move all extremities   Lymphadenopathy:    She has no cervical adenopathy.  Neurological: She is alert.  Skin: Skin is warm and dry. She is not diaphoretic.  Incision line  without signs of infection present   Psychiatric: She has a normal mood and affect.       ASSESSMENT/ PLAN:  1. Neuroendocrine carcinoma: is status post partial colectomy and followed by surgery. Will continue ultram 50 or 100 mg every 6 hours as needed for pain and will continue robaxin 500 mg daily for pain  Will monitor  2. Diabetes: will continue amaryl 2 mg daily and levemir 10 units nightly hgb a1c is 7.3  3. Dyslipidemia: will continue lipitor 20 mg daily   4. Chronic diastolic heart failure: will continue lasix 40 mg daily with k+ 40 meq daily will continue isordil 5 mg three times daily coreg 3.125 mg twice daily   5. Seizure: no reports of seizure activity present; will continue keppra 500 mg twice daily   6. Jerrye Bushy: will continue prilosec 20 mg daily  7. Constipation: will continue dulcolax supp daily and prn; senna nightly and florastor daily   8. CVA: is neurologically stable; will continue asa 325 mg daily   9. Hypertension: will continue coreg 3.125 mg twice daily   10. Physical deconditioning: will continue therapy as directed to improve upon her gait; balance; adl training.    Time spent with patient  50  minutes >50% time spent counseling; reviewing medical record; tests; labs; and developing  future plan of care      Ok Edwards NP [facility]  Contact [phone] Monday through Friday 8am- 5pm  After hours call [phone]

## 2015-05-10 ENCOUNTER — Non-Acute Institutional Stay (SKILLED_NURSING_FACILITY): Payer: Medicare HMO | Admitting: Internal Medicine

## 2015-05-10 DIAGNOSIS — E1165 Type 2 diabetes mellitus with hyperglycemia: Secondary | ICD-10-CM

## 2015-05-10 DIAGNOSIS — E43 Unspecified severe protein-calorie malnutrition: Secondary | ICD-10-CM | POA: Diagnosis not present

## 2015-05-10 DIAGNOSIS — Z8673 Personal history of transient ischemic attack (TIA), and cerebral infarction without residual deficits: Secondary | ICD-10-CM

## 2015-05-10 DIAGNOSIS — R5381 Other malaise: Secondary | ICD-10-CM | POA: Diagnosis not present

## 2015-05-10 DIAGNOSIS — Z794 Long term (current) use of insulin: Secondary | ICD-10-CM

## 2015-05-10 DIAGNOSIS — G40909 Epilepsy, unspecified, not intractable, without status epilepticus: Secondary | ICD-10-CM | POA: Diagnosis not present

## 2015-05-10 DIAGNOSIS — IMO0002 Reserved for concepts with insufficient information to code with codable children: Secondary | ICD-10-CM

## 2015-05-10 DIAGNOSIS — I10 Essential (primary) hypertension: Secondary | ICD-10-CM | POA: Diagnosis not present

## 2015-05-10 DIAGNOSIS — I5032 Chronic diastolic (congestive) heart failure: Secondary | ICD-10-CM | POA: Diagnosis not present

## 2015-05-10 DIAGNOSIS — E785 Hyperlipidemia, unspecified: Secondary | ICD-10-CM | POA: Diagnosis not present

## 2015-05-10 DIAGNOSIS — C7A8 Other malignant neuroendocrine tumors: Secondary | ICD-10-CM | POA: Diagnosis not present

## 2015-05-15 ENCOUNTER — Other Ambulatory Visit: Payer: Self-pay | Admitting: Neurology

## 2015-05-15 DIAGNOSIS — R413 Other amnesia: Secondary | ICD-10-CM

## 2015-05-16 DIAGNOSIS — R4182 Altered mental status, unspecified: Secondary | ICD-10-CM | POA: Insufficient documentation

## 2015-05-17 ENCOUNTER — Non-Acute Institutional Stay (SKILLED_NURSING_FACILITY): Payer: Medicare HMO | Admitting: Adult Health

## 2015-05-17 DIAGNOSIS — E1140 Type 2 diabetes mellitus with diabetic neuropathy, unspecified: Secondary | ICD-10-CM | POA: Diagnosis not present

## 2015-05-17 DIAGNOSIS — E1149 Type 2 diabetes mellitus with other diabetic neurological complication: Secondary | ICD-10-CM

## 2015-05-19 DIAGNOSIS — E1149 Type 2 diabetes mellitus with other diabetic neurological complication: Secondary | ICD-10-CM | POA: Diagnosis not present

## 2015-05-19 DIAGNOSIS — I10 Essential (primary) hypertension: Secondary | ICD-10-CM | POA: Diagnosis not present

## 2015-05-19 DIAGNOSIS — I639 Cerebral infarction, unspecified: Secondary | ICD-10-CM | POA: Diagnosis not present

## 2015-05-19 DIAGNOSIS — R69 Illness, unspecified: Secondary | ICD-10-CM | POA: Diagnosis not present

## 2015-05-19 DIAGNOSIS — R269 Unspecified abnormalities of gait and mobility: Secondary | ICD-10-CM | POA: Diagnosis not present

## 2015-05-19 DIAGNOSIS — I6330 Cerebral infarction due to thrombosis of unspecified cerebral artery: Secondary | ICD-10-CM | POA: Diagnosis not present

## 2015-05-19 DIAGNOSIS — E089 Diabetes mellitus due to underlying condition without complications: Secondary | ICD-10-CM | POA: Diagnosis not present

## 2015-05-19 DIAGNOSIS — C7A8 Other malignant neuroendocrine tumors: Secondary | ICD-10-CM | POA: Diagnosis not present

## 2015-05-19 DIAGNOSIS — I5030 Unspecified diastolic (congestive) heart failure: Secondary | ICD-10-CM | POA: Diagnosis not present

## 2015-05-19 DIAGNOSIS — K219 Gastro-esophageal reflux disease without esophagitis: Secondary | ICD-10-CM | POA: Diagnosis not present

## 2015-05-19 DIAGNOSIS — R1310 Dysphagia, unspecified: Secondary | ICD-10-CM | POA: Diagnosis not present

## 2015-05-19 DIAGNOSIS — R569 Unspecified convulsions: Secondary | ICD-10-CM | POA: Diagnosis not present

## 2015-05-19 DIAGNOSIS — G40909 Epilepsy, unspecified, not intractable, without status epilepticus: Secondary | ICD-10-CM | POA: Diagnosis not present

## 2015-05-19 DIAGNOSIS — I679 Cerebrovascular disease, unspecified: Secondary | ICD-10-CM | POA: Diagnosis not present

## 2015-05-19 DIAGNOSIS — E785 Hyperlipidemia, unspecified: Secondary | ICD-10-CM | POA: Diagnosis not present

## 2015-05-19 DIAGNOSIS — I5032 Chronic diastolic (congestive) heart failure: Secondary | ICD-10-CM | POA: Diagnosis not present

## 2015-05-22 ENCOUNTER — Other Ambulatory Visit: Payer: Self-pay | Admitting: General Surgery

## 2015-05-22 DIAGNOSIS — R160 Hepatomegaly, not elsewhere classified: Secondary | ICD-10-CM

## 2015-05-23 ENCOUNTER — Non-Acute Institutional Stay (SKILLED_NURSING_FACILITY): Payer: Medicare HMO | Admitting: Adult Health

## 2015-05-23 ENCOUNTER — Other Ambulatory Visit: Payer: Self-pay | Admitting: General Surgery

## 2015-05-23 DIAGNOSIS — I5032 Chronic diastolic (congestive) heart failure: Secondary | ICD-10-CM | POA: Diagnosis not present

## 2015-05-23 DIAGNOSIS — I10 Essential (primary) hypertension: Secondary | ICD-10-CM | POA: Diagnosis not present

## 2015-05-23 DIAGNOSIS — E1149 Type 2 diabetes mellitus with other diabetic neurological complication: Secondary | ICD-10-CM

## 2015-05-23 DIAGNOSIS — I6330 Cerebral infarction due to thrombosis of unspecified cerebral artery: Secondary | ICD-10-CM

## 2015-05-23 DIAGNOSIS — R160 Hepatomegaly, not elsewhere classified: Secondary | ICD-10-CM

## 2015-05-24 ENCOUNTER — Encounter: Payer: Self-pay | Admitting: Internal Medicine

## 2015-05-28 DIAGNOSIS — I10 Essential (primary) hypertension: Secondary | ICD-10-CM | POA: Diagnosis not present

## 2015-05-28 DIAGNOSIS — D649 Anemia, unspecified: Secondary | ICD-10-CM | POA: Diagnosis not present

## 2015-05-28 DIAGNOSIS — E1159 Type 2 diabetes mellitus with other circulatory complications: Secondary | ICD-10-CM | POA: Diagnosis not present

## 2015-05-28 DIAGNOSIS — D519 Vitamin B12 deficiency anemia, unspecified: Secondary | ICD-10-CM | POA: Diagnosis not present

## 2015-05-28 DIAGNOSIS — R790 Abnormal level of blood mineral: Secondary | ICD-10-CM | POA: Diagnosis not present

## 2015-05-28 DIAGNOSIS — C7A00 Malignant carcinoid tumor of unspecified site: Secondary | ICD-10-CM | POA: Diagnosis not present

## 2015-05-28 DIAGNOSIS — E872 Acidosis: Secondary | ICD-10-CM | POA: Diagnosis not present

## 2015-05-28 DIAGNOSIS — R748 Abnormal levels of other serum enzymes: Secondary | ICD-10-CM | POA: Diagnosis not present

## 2015-05-29 DIAGNOSIS — R5381 Other malaise: Secondary | ICD-10-CM | POA: Insufficient documentation

## 2015-05-31 DIAGNOSIS — I5030 Unspecified diastolic (congestive) heart failure: Secondary | ICD-10-CM | POA: Diagnosis not present

## 2015-05-31 DIAGNOSIS — G40909 Epilepsy, unspecified, not intractable, without status epilepticus: Secondary | ICD-10-CM | POA: Diagnosis not present

## 2015-05-31 DIAGNOSIS — R69 Illness, unspecified: Secondary | ICD-10-CM | POA: Diagnosis not present

## 2015-05-31 DIAGNOSIS — C7A8 Other malignant neuroendocrine tumors: Secondary | ICD-10-CM | POA: Diagnosis not present

## 2015-05-31 DIAGNOSIS — K219 Gastro-esophageal reflux disease without esophagitis: Secondary | ICD-10-CM | POA: Diagnosis not present

## 2015-05-31 DIAGNOSIS — E119 Type 2 diabetes mellitus without complications: Secondary | ICD-10-CM | POA: Diagnosis not present

## 2015-05-31 DIAGNOSIS — E785 Hyperlipidemia, unspecified: Secondary | ICD-10-CM | POA: Diagnosis not present

## 2015-06-07 ENCOUNTER — Ambulatory Visit
Admission: RE | Admit: 2015-06-07 | Discharge: 2015-06-07 | Disposition: A | Discharge status: Home / Self Care | Payer: Medicare HMO | Source: Ambulatory Visit | Attending: General Surgery | Admitting: General Surgery

## 2015-06-07 DIAGNOSIS — D490 Neoplasm of unspecified behavior of digestive system: Secondary | ICD-10-CM | POA: Diagnosis not present

## 2015-06-07 MED ORDER — IOPAMIDOL (ISOVUE-300) INJECTION 61%
100.0000 mL | Freq: Once | INTRAVENOUS | Status: AC | PRN
  Administered 2015-06-07: 100 mL via INTRAVENOUS

## 2015-06-08 DIAGNOSIS — D649 Anemia, unspecified: Secondary | ICD-10-CM | POA: Diagnosis not present

## 2015-06-08 DIAGNOSIS — Z79899 Other long term (current) drug therapy: Secondary | ICD-10-CM | POA: Diagnosis not present

## 2015-06-12 DIAGNOSIS — K219 Gastro-esophageal reflux disease without esophagitis: Secondary | ICD-10-CM | POA: Diagnosis not present

## 2015-06-12 DIAGNOSIS — E785 Hyperlipidemia, unspecified: Secondary | ICD-10-CM | POA: Diagnosis not present

## 2015-06-12 DIAGNOSIS — I5030 Unspecified diastolic (congestive) heart failure: Secondary | ICD-10-CM | POA: Diagnosis not present

## 2015-06-12 DIAGNOSIS — G40909 Epilepsy, unspecified, not intractable, without status epilepticus: Secondary | ICD-10-CM | POA: Diagnosis not present

## 2015-06-12 DIAGNOSIS — E1149 Type 2 diabetes mellitus with other diabetic neurological complication: Secondary | ICD-10-CM | POA: Insufficient documentation

## 2015-06-12 DIAGNOSIS — C7A8 Other malignant neuroendocrine tumors: Secondary | ICD-10-CM | POA: Diagnosis not present

## 2015-06-12 DIAGNOSIS — R69 Illness, unspecified: Secondary | ICD-10-CM | POA: Diagnosis not present

## 2015-06-12 DIAGNOSIS — E119 Type 2 diabetes mellitus without complications: Secondary | ICD-10-CM | POA: Diagnosis not present

## 2015-06-12 NOTE — Progress Notes (Signed)
Patient ID: April [name], female   DOB: 04/10/1941, 74 y.o.   MRN: [id number]    Facility: [facility]      Allergies  Allergen Reactions  . Codeine Nausea And Vomiting  . Sulfa Antibiotics Nausea And Vomiting    Chief Complaint  Patient presents with  . Acute Visit    diabetes     HPI:  Her cbg's are getting worse; with all readings are elevated. She is presently taking levemir 10 units daily and amaryl daily . She would prefer not to take more insulin at this time. She is willing to taking another medication. She is not having symptoms of elevated cbg's.    Past Medical History  Diagnosis Date  . Diabetes mellitus   . Hypertension   . Retinopathy 06/06/2013    Per eye exam 05/31/13 eye exam, Moderate retinopathy   . Aphasia   . Seizures ([place])   . Rectum bleeding   . HYPERLIPIDEMIA 10/15/2006    Qualifier: Diagnosis of  By: Beryle Lathe    . GERD (gastroesophageal reflux disease) 05/06/2013  . Gram-positive cocci bacteremia   . Seizure disorder, status epilepticus, nonconvulsive ([place])   . Cerebral infarction due to unspecified mechanism   . Uncontrolled type 2 diabetes mellitus with insulin therapy ([place]) 01/17/2015  . Chronic diastolic CHF (congestive heart failure), grade I 01/18/2015  . Family history of adverse reaction to anesthesia   . Stroke [facility]) 10/21/2014    Past Surgical History  Procedure Laterality Date  . Appendectomy  1972  . Abdominal hysterectomy  1972  . Tee without cardioversion N/A 10/24/2014    Procedure: TRANSESOPHAGEAL ECHOCARDIOGRAM (TEE);  Surgeon: Dorothy Spark, MD;  Location: [place];  Service: Cardiovascular;  Laterality: N/A;  . Cystoscopy w/ ureteral stent placement Left 02/08/2015    Procedure: CYSTOSCOPY WITH RETROGRADE left PYELOGRAM/URETERAL STENT PLACEMENT left insertion of foley;  Surgeon: Franchot Gallo, MD;  Location: [facility];  Service: Urology;  Laterality: Left;  . Colonoscopy with propofol N/A  04/06/2015    Procedure: COLONOSCOPY WITH PROPOFOL;  Surgeon: Carol Ada, MD;  Location: [facility];  Service: Endoscopy;  Laterality: N/A;  . Left leg fracture      broken leg-left in 3 places  . Eye surgery  08/2014    bilateral cataract with lens implants  . Tonsillectomy      as child-age 75  . Laparoscopic partial colectomy N/A 05/03/2015    Procedure: LAPAROSCOPIC PARTIAL COLECTOMY;  Surgeon: [name], MD;  Location: [facility];  Service: General;  Laterality: N/A;    VITAL SIGNS BP 144/87 mmHg  Pulse 82  Ht '4\' 9"'  (1.448 m)  Wt 110 lb (49.896 kg)  BMI 23.80 kg/m2  LMP 08/18/1970  Patient's Medications  AMINO ACIDS-PROTEIN HYDROLYS (FEEDING SUPPLEMENT, PRO-STAT SUGAR FREE 64,) LIQD    Take 30 mLs by mouth 2 (two) times daily.  ASPIRIN EC 325 MG TABLET    Take 1 tablet (325 mg total) by mouth daily.  ATORVASTATIN (LIPITOR) 20 MG TABLET    Take 1 tablet (20 mg total) by mouth daily at 6 PM.  BISACODYL (DULCOLAX) 10 MG SUPPOSITORY    Place 1 suppository (10 mg total) rectally daily as needed for moderate constipation.  CARVEDILOL (COREG) 3.125 MG TABLET    Take 1 tablet (3.125 mg total) by mouth 2 (two) times daily with a meal.  FUROSEMIDE (LASIX) 40 MG TABLET    Take 1 tablet daily. If you gain > 3lbs in 1 day  or 5 lbs in 1 week, take an extra dose.  GLIMEPIRIDE (AMARYL) 2 MG TABLET    Take 1 tablet (2 mg total) by mouth daily with breakfast.  INSULIN DETEMIR (LEVEMIR) 100 UNIT/ML INJECTION    Inject 0.1 mLs (10 Units total) into the skin daily.  ISOSORBIDE DINITRATE (ISORDIL) 5 MG TABLET    Take 1 tablet (5 mg total) by mouth 3 (three) times daily. Take 1 tablet by mouth three times a day  LEVETIRACETAM (KEPPRA) 500 MG TABLET    Take 1 tablet (500 mg total) by mouth 2 (two) times daily.  METHOCARBAMOL (ROBAXIN) 500 MG TABLET    Take 1 tablet (500 mg total) by mouth daily.  OMEPRAZOLE (PRILOSEC) 20 MG CAPSULE    Take 20 mg by mouth daily.  ONDANSETRON (ZOFRAN ODT) 8 MG  DISINTEGRATING TABLET    Take 1 tablet (8 mg total) by mouth every 8 (eight) hours as needed for nausea or vomiting.  POTASSIUM CHLORIDE SA (K-DUR,KLOR-CON) 20 MEQ TABLET    Take 2 tablets (40 mEq total) by mouth daily.  SACCHAROMYCES BOULARDII (FLORASTOR) 250 MG CAPSULE    Take 1 capsule (250 mg total) by mouth 2 (two) times daily.  SENNA (SENOKOT) 8.6 MG TABS TABLET    Take 1 tablet by mouth at bedtime.  TRAMADOL (ULTRAM) 50 MG TABLET    Take 1-2 tablets (50-100 mg total) by mouth every 6 (six) hours as needed for moderate pain or severe pain.    New Prescriptions   No medications on file  Previous Medications  Modified Medications   No medications on file  Discontinued Medications   No medications on file     SIGNIFICANT DIAGNOSTIC EXAMS  05-03-15: chest x-ray: No active cardiopulmonary disease.  05-07-15: mri of brain: No acute intracranial abnormality.  Chronic lacunar infarcts plus nonspecific but probable small vessel related signal changes in the cerebral white matter and cerebellar peduncle spirit   LABS REVIEWED:   05-03-15: glucose 169; bun 13; creat 0.76; k+ 3.8; na++141; alk phos 173; alt 13; ast 18; albumin 3.6; hgb a1c 7.3 05-04-15: wbc 13.2; hgb 10.9; hct 34.4; mcv 91.7; plt 408; glucose 256; bun 15; creat 0.86; k+ 4.2; na++141 05-06-15: wbc 11.8; hgb 11.7; hct 35.8; mcv 91.6; plt 382; glucose 125; bun 12; creat 0.62; k+ 3.7; na++143      Review of Systems Constitutional: Negative for appetite change and fatigue.  HENT: Negative for congestion.   Respiratory: Negative for cough, chest tightness and shortness of breath.   Cardiovascular: Negative for chest pain, palpitations and leg swelling.  Gastrointestinal: Negative for nausea, abdominal pain, diarrhea and constipation.  Musculoskeletal: Negative for myalgias and arthralgias.  Skin: Negative for pallor.  Neurological: Negative for dizziness.  Psychiatric/Behavioral: The patient is not nervous/anxious.        Physical Exam Constitutional: No distress.  Frail   Eyes: Conjunctivae are normal.  Neck: Neck supple. No JVD present. No thyromegaly present.  Cardiovascular: Normal rate, regular rhythm and intact distal pulses.   Respiratory: Effort normal and breath sounds normal. No respiratory distress. She has no wheezes.  GI: Soft. Bowel sounds are normal. She exhibits no distension. There is no tenderness.  Musculoskeletal: She exhibits no edema.  Able to move all extremities   Lymphadenopathy:    She has no cervical adenopathy.  Neurological: She is alert.  Skin: Skin is warm and dry. She is not diaphoretic.  Incision line without signs of infection present   Psychiatric: She has a  normal mood and affect.       ASSESSMENT/ PLAN:  1. Diabetes: will continue amaryl 2 mg daily and levemir 10 units nightly will begin metformin er 500 mg daily; will check bmp in 2 weeks;  hgb a1c is 7.3   Ok Edwards NP [facility]  Contact [phone] Monday through Friday 8am- 5pm  After hours call [phone]

## 2015-06-14 ENCOUNTER — Encounter: Payer: Self-pay | Admitting: Adult Health

## 2015-06-14 NOTE — Progress Notes (Signed)
Patient ID: April [name], female   DOB: 1940/11/21, 74 y.o.   MRN: [id number]   Facility: [facility]      Allergies  Allergen Reactions  . Codeine Nausea And Vomiting  . Sulfa Antibiotics Nausea And Vomiting    Chief Complaint  Patient presents with  . Discharge Note    HPI:  She is being discharged back to her assisted living facility. She will not need dme. She will need home health for pt/ot. She will need her prescriptions written and will follow up her medical care at the assisted living.    Past Medical History  Diagnosis Date  . Diabetes mellitus   . Hypertension   . Retinopathy 06/06/2013    Per eye exam 05/31/13 eye exam, Moderate retinopathy   . Aphasia   . Seizures ([place])   . Rectum bleeding   . HYPERLIPIDEMIA 10/15/2006    Qualifier: Diagnosis of  By: Beryle Lathe    . GERD (gastroesophageal reflux disease) 05/06/2013  . Gram-positive cocci bacteremia   . Seizure disorder, status epilepticus, nonconvulsive ([place])   . Cerebral infarction due to unspecified mechanism   . Uncontrolled type 2 diabetes mellitus with insulin therapy ([place]) 01/17/2015  . Chronic diastolic CHF (congestive heart failure), grade I 01/18/2015  . Family history of adverse reaction to anesthesia   . Stroke [facility]) 10/21/2014    Past Surgical History  Procedure Laterality Date  . Appendectomy  1972  . Abdominal hysterectomy  1972  . Tee without cardioversion N/A 10/24/2014    Procedure: TRANSESOPHAGEAL ECHOCARDIOGRAM (TEE);  Surgeon: Dorothy Spark, MD;  Location: [place];  Service: Cardiovascular;  Laterality: N/A;  . Cystoscopy w/ ureteral stent placement Left 02/08/2015    Procedure: CYSTOSCOPY WITH RETROGRADE left PYELOGRAM/URETERAL STENT PLACEMENT left insertion of foley;  Surgeon: Franchot Gallo, MD;  [facility];  Service: Urology;  Laterality: Left;  . Colonoscopy with propofol N/A 04/06/2015    Procedure: COLONOSCOPY WITH PROPOFOL;  Surgeon: Carol Ada, MD;  [facility];  Service: Endoscopy;  Laterality: N/A;  . Left leg fracture      broken leg-left in 3 places  . Eye surgery  08/2014    bilateral cataract with lens implants  . Tonsillectomy      as child-age 74  . Laparoscopic partial colectomy N/A 05/03/2015    Procedure: LAPAROSCOPIC PARTIAL COLECTOMY;  Surgeon: [name], MD;  [facility]: [facility];  Service: General;  Laterality: N/A;    VITAL SIGNS BP 144/87 mmHg  Pulse 82  Ht _0  (1.448 m)  Wt 110 lb (49.896 kg)  BMI 23.80 kg/m2  SpO2 98%  LMP 08/18/1970  Patient's Medications  New Prescriptions   No medications on file  Previous Medications   AMINO ACIDS-PROTEIN HYDROLYS (FEEDING SUPPLEMENT, PRO-STAT SUGAR FREE 64,) LIQD    Take 30 mLs by mouth 2 (two) times daily.   ASPIRIN EC 325 MG TABLET    Take 1 tablet (325 mg total) by mouth daily.   ATORVASTATIN (LIPITOR) 20 MG TABLET    Take 1 tablet (20 mg total) by mouth daily at 6 PM.   BISACODYL (DULCOLAX) 10 MG SUPPOSITORY    Place 1 suppository (10 mg total) rectally daily as needed for moderate constipation.   CARVEDILOL (COREG) 3.125 MG TABLET    Take 1 tablet (3.125 mg total) by mouth 2 (two) times daily with a meal.   FUROSEMIDE (LASIX) 40 MG TABLET    Take 1 tablet daily. If you  gain > 3lbs in 1 day or 5 lbs in 1 week, take an extra dose.   GLIMEPIRIDE (AMARYL) 2 MG TABLET    Take 1 tablet (2 mg total) by mouth daily with breakfast.   INSULIN DETEMIR (LEVEMIR) 100 UNIT/ML INJECTION    Inject 0.1 mLs (10 Units total) into the skin daily.   ISOSORBIDE DINITRATE (ISORDIL) 5 MG TABLET    Take 1 tablet (5 mg total) by mouth 3 (three) times daily. Take 1 tablet by mouth three times a day   LEVETIRACETAM (KEPPRA) 500 MG TABLET    Take 1 tablet (500 mg total) by mouth 2 (two) times daily.   METHOCARBAMOL (ROBAXIN) 500 MG TABLET    Take 1 tablet (500 mg total) by mouth once.   OMEPRAZOLE (PRILOSEC) 20 MG CAPSULE    Take 20 mg by mouth daily.   ONDANSETRON  (ZOFRAN ODT) 8 MG DISINTEGRATING TABLET    Take 1 tablet (8 mg total) by mouth every 8 (eight) hours as needed for nausea or vomiting.   POTASSIUM CHLORIDE SA (K-DUR,KLOR-CON) 20 MEQ TABLET    Take 2 tablets (40 mEq total) by mouth daily.   SACCHAROMYCES BOULARDII (FLORASTOR) 250 MG CAPSULE    Take 1 capsule (250 mg total) by mouth 2 (two) times daily.   SENNA (SENOKOT) 8.6 MG TABS TABLET    Take 1 tablet by mouth at bedtime.   TRAMADOL (ULTRAM) 50 MG TABLET    Take 1-2 tablets (50-100 mg total) by mouth every 6 (six) hours as needed for moderate pain or severe pain.  Modified Medications   No medications on file  Discontinued Medications   No medications on file     SIGNIFICANT DIAGNOSTIC EXAMS   05-03-15: chest x-ray: No active cardiopulmonary disease.  05-07-15: mri of brain: No acute intracranial abnormality.  Chronic lacunar infarcts plus nonspecific but probable small vessel related signal changes in the cerebral white matter and cerebellar peduncle spirit   LABS REVIEWED:   05-03-15: glucose 169; bun 13; creat 0.76; k+ 3.8; na++141; alk phos 173; alt 13; ast 18; albumin 3.6; hgb a1c 7.3 05-04-15: wbc 13.2; hgb 10.9; hct 34.4; mcv 91.7; plt 408; glucose 256; bun 15; creat 0.86; k+ 4.2; na++141 05-06-15: wbc 11.8; hgb 11.7; hct 35.8; mcv 91.6; plt 382; glucose 125; bun 12; creat 0.62; k+ 3.7; na++143       Review of Systems Constitutional: Negative for appetite change and fatigue.  HENT: Negative for congestion.   Respiratory: Negative for cough, chest tightness and shortness of breath.   Cardiovascular: Negative for chest pain, palpitations and leg swelling.  Gastrointestinal: Negative for nausea, abdominal pain, diarrhea and constipation.  Musculoskeletal: Negative for myalgias and arthralgias.  Skin: Negative for pallor.  Neurological: Negative for dizziness.  Psychiatric/Behavioral: The patient is not nervous/anxious.      Physical Exam Constitutional: No distress.    Frail   Eyes: Conjunctivae are normal.  Neck: Neck supple. No JVD present. No thyromegaly present.  Cardiovascular: Normal rate, regular rhythm and intact distal pulses.   Respiratory: Effort normal and breath sounds normal. No respiratory distress. She has no wheezes.  GI: Soft. Bowel sounds are normal. She exhibits no distension. There is no tenderness.  Musculoskeletal: She exhibits no edema.  Able to move all extremities   Lymphadenopathy:    She has no cervical adenopathy.  Neurological: She is alert.  Skin: Skin is warm and dry. She is not diaphoretic.  Incision line without signs of infection present  Psychiatric: She has a normal mood and affect.       ASSESSMENT/ PLAN:  Will discharge to assisted living with home health for pt/ot to evaluate and treat as indicated for gait; balance; and adl training. Her prescriptions have been written for a 30 day supply of her medications with #60 ultram 50 mg tabs. She will not need dme. She will follow up medically with the facility provider.     Time spent with patient 40   minutes >50% time spent counseling; reviewing medical record; tests; labs; and developing future plan of care     Ok Edwards NP [facility]  Contact [phone] Monday through Friday 8am- 5pm  After hours call [phone]

## 2015-06-16 DIAGNOSIS — K219 Gastro-esophageal reflux disease without esophagitis: Secondary | ICD-10-CM | POA: Diagnosis not present

## 2015-06-16 DIAGNOSIS — I5030 Unspecified diastolic (congestive) heart failure: Secondary | ICD-10-CM | POA: Diagnosis not present

## 2015-06-16 DIAGNOSIS — G40909 Epilepsy, unspecified, not intractable, without status epilepticus: Secondary | ICD-10-CM | POA: Diagnosis not present

## 2015-06-16 DIAGNOSIS — E785 Hyperlipidemia, unspecified: Secondary | ICD-10-CM | POA: Diagnosis not present

## 2015-06-16 DIAGNOSIS — E119 Type 2 diabetes mellitus without complications: Secondary | ICD-10-CM | POA: Diagnosis not present

## 2015-06-16 DIAGNOSIS — R69 Illness, unspecified: Secondary | ICD-10-CM | POA: Diagnosis not present

## 2015-06-16 DIAGNOSIS — C7A8 Other malignant neuroendocrine tumors: Secondary | ICD-10-CM | POA: Diagnosis not present

## 2015-06-18 DIAGNOSIS — D649 Anemia, unspecified: Secondary | ICD-10-CM | POA: Diagnosis not present

## 2015-06-18 DIAGNOSIS — E873 Alkalosis: Secondary | ICD-10-CM | POA: Diagnosis not present

## 2015-06-18 DIAGNOSIS — Z79899 Other long term (current) drug therapy: Secondary | ICD-10-CM | POA: Diagnosis not present

## 2015-06-18 DIAGNOSIS — R339 Retention of urine, unspecified: Secondary | ICD-10-CM | POA: Diagnosis not present

## 2015-06-18 DIAGNOSIS — I10 Essential (primary) hypertension: Secondary | ICD-10-CM | POA: Diagnosis not present

## 2015-06-18 DIAGNOSIS — R110 Nausea: Secondary | ICD-10-CM | POA: Diagnosis not present

## 2015-06-18 DIAGNOSIS — R252 Cramp and spasm: Secondary | ICD-10-CM | POA: Diagnosis not present

## 2015-06-20 DIAGNOSIS — R339 Retention of urine, unspecified: Secondary | ICD-10-CM | POA: Diagnosis not present

## 2015-06-20 DIAGNOSIS — R4701 Aphasia: Secondary | ICD-10-CM | POA: Diagnosis not present

## 2015-06-20 DIAGNOSIS — R2689 Other abnormalities of gait and mobility: Secondary | ICD-10-CM | POA: Diagnosis not present

## 2015-06-20 DIAGNOSIS — G9340 Encephalopathy, unspecified: Secondary | ICD-10-CM | POA: Diagnosis not present

## 2015-06-20 DIAGNOSIS — I63349 Cerebral infarction due to thrombosis of unspecified cerebellar artery: Secondary | ICD-10-CM | POA: Diagnosis not present

## 2015-06-21 DIAGNOSIS — G40909 Epilepsy, unspecified, not intractable, without status epilepticus: Secondary | ICD-10-CM | POA: Diagnosis not present

## 2015-06-21 DIAGNOSIS — K219 Gastro-esophageal reflux disease without esophagitis: Secondary | ICD-10-CM | POA: Diagnosis not present

## 2015-06-21 DIAGNOSIS — R69 Illness, unspecified: Secondary | ICD-10-CM | POA: Diagnosis not present

## 2015-06-21 DIAGNOSIS — E119 Type 2 diabetes mellitus without complications: Secondary | ICD-10-CM | POA: Diagnosis not present

## 2015-06-21 DIAGNOSIS — C7A8 Other malignant neuroendocrine tumors: Secondary | ICD-10-CM | POA: Diagnosis not present

## 2015-06-21 DIAGNOSIS — I5030 Unspecified diastolic (congestive) heart failure: Secondary | ICD-10-CM | POA: Diagnosis not present

## 2015-06-21 DIAGNOSIS — E785 Hyperlipidemia, unspecified: Secondary | ICD-10-CM | POA: Diagnosis not present

## 2015-06-21 DIAGNOSIS — H40013 Open angle with borderline findings, low risk, bilateral: Secondary | ICD-10-CM | POA: Diagnosis not present

## 2015-06-22 DIAGNOSIS — G9340 Encephalopathy, unspecified: Secondary | ICD-10-CM | POA: Diagnosis not present

## 2015-06-22 DIAGNOSIS — I63349 Cerebral infarction due to thrombosis of unspecified cerebellar artery: Secondary | ICD-10-CM | POA: Diagnosis not present

## 2015-06-22 DIAGNOSIS — R339 Retention of urine, unspecified: Secondary | ICD-10-CM | POA: Diagnosis not present

## 2015-06-22 DIAGNOSIS — R4701 Aphasia: Secondary | ICD-10-CM | POA: Diagnosis not present

## 2015-06-22 DIAGNOSIS — R2689 Other abnormalities of gait and mobility: Secondary | ICD-10-CM | POA: Diagnosis not present

## 2015-06-24 DIAGNOSIS — R569 Unspecified convulsions: Secondary | ICD-10-CM | POA: Diagnosis not present

## 2015-06-24 DIAGNOSIS — R269 Unspecified abnormalities of gait and mobility: Secondary | ICD-10-CM | POA: Diagnosis not present

## 2015-06-24 DIAGNOSIS — I679 Cerebrovascular disease, unspecified: Secondary | ICD-10-CM | POA: Diagnosis not present

## 2015-06-25 DIAGNOSIS — Z483 Aftercare following surgery for neoplasm: Secondary | ICD-10-CM | POA: Diagnosis not present

## 2015-06-25 DIAGNOSIS — C189 Malignant neoplasm of colon, unspecified: Secondary | ICD-10-CM | POA: Diagnosis not present

## 2015-06-26 DIAGNOSIS — I110 Hypertensive heart disease with heart failure: Secondary | ICD-10-CM | POA: Diagnosis not present

## 2015-06-26 DIAGNOSIS — R69 Illness, unspecified: Secondary | ICD-10-CM | POA: Diagnosis not present

## 2015-06-26 DIAGNOSIS — Z483 Aftercare following surgery for neoplasm: Secondary | ICD-10-CM | POA: Diagnosis not present

## 2015-06-26 DIAGNOSIS — I5030 Unspecified diastolic (congestive) heart failure: Secondary | ICD-10-CM | POA: Diagnosis not present

## 2015-06-26 DIAGNOSIS — C189 Malignant neoplasm of colon, unspecified: Secondary | ICD-10-CM | POA: Diagnosis not present

## 2015-06-27 DIAGNOSIS — I5030 Unspecified diastolic (congestive) heart failure: Secondary | ICD-10-CM | POA: Diagnosis not present

## 2015-06-27 DIAGNOSIS — I110 Hypertensive heart disease with heart failure: Secondary | ICD-10-CM | POA: Diagnosis not present

## 2015-06-27 DIAGNOSIS — C189 Malignant neoplasm of colon, unspecified: Secondary | ICD-10-CM | POA: Diagnosis not present

## 2015-06-27 DIAGNOSIS — Z483 Aftercare following surgery for neoplasm: Secondary | ICD-10-CM | POA: Diagnosis not present

## 2015-06-27 DIAGNOSIS — R69 Illness, unspecified: Secondary | ICD-10-CM | POA: Diagnosis not present

## 2015-06-28 ENCOUNTER — Ambulatory Visit: Payer: Medicare HMO | Admitting: Adult Health

## 2015-06-28 DIAGNOSIS — I5030 Unspecified diastolic (congestive) heart failure: Secondary | ICD-10-CM | POA: Diagnosis not present

## 2015-06-28 DIAGNOSIS — Z483 Aftercare following surgery for neoplasm: Secondary | ICD-10-CM | POA: Diagnosis not present

## 2015-06-28 DIAGNOSIS — R69 Illness, unspecified: Secondary | ICD-10-CM | POA: Diagnosis not present

## 2015-06-28 DIAGNOSIS — C189 Malignant neoplasm of colon, unspecified: Secondary | ICD-10-CM | POA: Diagnosis not present

## 2015-06-28 DIAGNOSIS — I110 Hypertensive heart disease with heart failure: Secondary | ICD-10-CM | POA: Diagnosis not present

## 2015-07-02 DIAGNOSIS — K5900 Constipation, unspecified: Secondary | ICD-10-CM | POA: Diagnosis not present

## 2015-07-02 DIAGNOSIS — R635 Abnormal weight gain: Secondary | ICD-10-CM | POA: Diagnosis not present

## 2015-07-02 DIAGNOSIS — I639 Cerebral infarction, unspecified: Secondary | ICD-10-CM | POA: Diagnosis not present

## 2015-07-02 DIAGNOSIS — I10 Essential (primary) hypertension: Secondary | ICD-10-CM | POA: Diagnosis not present

## 2015-07-02 DIAGNOSIS — R54 Age-related physical debility: Secondary | ICD-10-CM | POA: Diagnosis not present

## 2015-07-02 DIAGNOSIS — Z79899 Other long term (current) drug therapy: Secondary | ICD-10-CM | POA: Diagnosis not present

## 2015-07-02 DIAGNOSIS — G4089 Other seizures: Secondary | ICD-10-CM | POA: Diagnosis not present

## 2015-07-02 DIAGNOSIS — E119 Type 2 diabetes mellitus without complications: Secondary | ICD-10-CM | POA: Diagnosis not present

## 2015-07-04 DIAGNOSIS — I110 Hypertensive heart disease with heart failure: Secondary | ICD-10-CM | POA: Diagnosis not present

## 2015-07-04 DIAGNOSIS — I5030 Unspecified diastolic (congestive) heart failure: Secondary | ICD-10-CM | POA: Diagnosis not present

## 2015-07-04 DIAGNOSIS — C189 Malignant neoplasm of colon, unspecified: Secondary | ICD-10-CM | POA: Diagnosis not present

## 2015-07-04 DIAGNOSIS — Z483 Aftercare following surgery for neoplasm: Secondary | ICD-10-CM | POA: Diagnosis not present

## 2015-07-04 DIAGNOSIS — R69 Illness, unspecified: Secondary | ICD-10-CM | POA: Diagnosis not present

## 2015-07-05 DIAGNOSIS — C189 Malignant neoplasm of colon, unspecified: Secondary | ICD-10-CM | POA: Diagnosis not present

## 2015-07-05 DIAGNOSIS — Z483 Aftercare following surgery for neoplasm: Secondary | ICD-10-CM | POA: Diagnosis not present

## 2015-07-05 DIAGNOSIS — R69 Illness, unspecified: Secondary | ICD-10-CM | POA: Diagnosis not present

## 2015-07-05 DIAGNOSIS — I5030 Unspecified diastolic (congestive) heart failure: Secondary | ICD-10-CM | POA: Diagnosis not present

## 2015-07-05 DIAGNOSIS — I110 Hypertensive heart disease with heart failure: Secondary | ICD-10-CM | POA: Diagnosis not present

## 2015-07-05 DIAGNOSIS — E113393 Type 2 diabetes mellitus with moderate nonproliferative diabetic retinopathy without macular edema, bilateral: Secondary | ICD-10-CM | POA: Diagnosis not present

## 2015-07-06 DIAGNOSIS — R69 Illness, unspecified: Secondary | ICD-10-CM | POA: Diagnosis not present

## 2015-07-06 DIAGNOSIS — I5030 Unspecified diastolic (congestive) heart failure: Secondary | ICD-10-CM | POA: Diagnosis not present

## 2015-07-06 DIAGNOSIS — I110 Hypertensive heart disease with heart failure: Secondary | ICD-10-CM | POA: Diagnosis not present

## 2015-07-06 DIAGNOSIS — C189 Malignant neoplasm of colon, unspecified: Secondary | ICD-10-CM | POA: Diagnosis not present

## 2015-07-06 DIAGNOSIS — Z483 Aftercare following surgery for neoplasm: Secondary | ICD-10-CM | POA: Diagnosis not present

## 2015-07-11 DIAGNOSIS — Z483 Aftercare following surgery for neoplasm: Secondary | ICD-10-CM | POA: Diagnosis not present

## 2015-07-11 DIAGNOSIS — R69 Illness, unspecified: Secondary | ICD-10-CM | POA: Diagnosis not present

## 2015-07-11 DIAGNOSIS — I5030 Unspecified diastolic (congestive) heart failure: Secondary | ICD-10-CM | POA: Diagnosis not present

## 2015-07-11 DIAGNOSIS — C189 Malignant neoplasm of colon, unspecified: Secondary | ICD-10-CM | POA: Diagnosis not present

## 2015-07-11 DIAGNOSIS — I110 Hypertensive heart disease with heart failure: Secondary | ICD-10-CM | POA: Diagnosis not present

## 2015-07-13 DIAGNOSIS — R69 Illness, unspecified: Secondary | ICD-10-CM | POA: Diagnosis not present

## 2015-07-13 DIAGNOSIS — C189 Malignant neoplasm of colon, unspecified: Secondary | ICD-10-CM | POA: Diagnosis not present

## 2015-07-13 DIAGNOSIS — I5030 Unspecified diastolic (congestive) heart failure: Secondary | ICD-10-CM | POA: Diagnosis not present

## 2015-07-13 DIAGNOSIS — Z483 Aftercare following surgery for neoplasm: Secondary | ICD-10-CM | POA: Diagnosis not present

## 2015-07-13 DIAGNOSIS — I110 Hypertensive heart disease with heart failure: Secondary | ICD-10-CM | POA: Diagnosis not present

## 2015-07-17 DIAGNOSIS — I110 Hypertensive heart disease with heart failure: Secondary | ICD-10-CM | POA: Diagnosis not present

## 2015-07-17 DIAGNOSIS — C189 Malignant neoplasm of colon, unspecified: Secondary | ICD-10-CM | POA: Diagnosis not present

## 2015-07-17 DIAGNOSIS — Z483 Aftercare following surgery for neoplasm: Secondary | ICD-10-CM | POA: Diagnosis not present

## 2015-07-17 DIAGNOSIS — I5030 Unspecified diastolic (congestive) heart failure: Secondary | ICD-10-CM | POA: Diagnosis not present

## 2015-07-17 DIAGNOSIS — R69 Illness, unspecified: Secondary | ICD-10-CM | POA: Diagnosis not present

## 2015-07-18 ENCOUNTER — Other Ambulatory Visit: Payer: Self-pay

## 2015-07-18 DIAGNOSIS — C189 Malignant neoplasm of colon, unspecified: Secondary | ICD-10-CM | POA: Diagnosis not present

## 2015-07-18 DIAGNOSIS — Z1231 Encounter for screening mammogram for malignant neoplasm of breast: Secondary | ICD-10-CM

## 2015-07-18 DIAGNOSIS — I5030 Unspecified diastolic (congestive) heart failure: Secondary | ICD-10-CM | POA: Diagnosis not present

## 2015-07-18 DIAGNOSIS — Z483 Aftercare following surgery for neoplasm: Secondary | ICD-10-CM | POA: Diagnosis not present

## 2015-07-18 DIAGNOSIS — R69 Illness, unspecified: Secondary | ICD-10-CM | POA: Diagnosis not present

## 2015-07-18 DIAGNOSIS — I110 Hypertensive heart disease with heart failure: Secondary | ICD-10-CM | POA: Diagnosis not present

## 2015-07-19 DIAGNOSIS — C189 Malignant neoplasm of colon, unspecified: Secondary | ICD-10-CM | POA: Diagnosis not present

## 2015-07-19 DIAGNOSIS — I5030 Unspecified diastolic (congestive) heart failure: Secondary | ICD-10-CM | POA: Diagnosis not present

## 2015-07-19 DIAGNOSIS — Z483 Aftercare following surgery for neoplasm: Secondary | ICD-10-CM | POA: Diagnosis not present

## 2015-07-19 DIAGNOSIS — I110 Hypertensive heart disease with heart failure: Secondary | ICD-10-CM | POA: Diagnosis not present

## 2015-07-19 DIAGNOSIS — R69 Illness, unspecified: Secondary | ICD-10-CM | POA: Diagnosis not present

## 2015-07-23 ENCOUNTER — Ambulatory Visit: Payer: Medicare HMO | Attending: Neurology | Admitting: Psychology

## 2015-07-23 DIAGNOSIS — C189 Malignant neoplasm of colon, unspecified: Secondary | ICD-10-CM | POA: Diagnosis not present

## 2015-07-23 DIAGNOSIS — I69319 Unspecified symptoms and signs involving cognitive functions following cerebral infarction: Secondary | ICD-10-CM

## 2015-07-23 DIAGNOSIS — Z483 Aftercare following surgery for neoplasm: Secondary | ICD-10-CM | POA: Diagnosis not present

## 2015-07-23 DIAGNOSIS — I110 Hypertensive heart disease with heart failure: Secondary | ICD-10-CM | POA: Diagnosis not present

## 2015-07-23 DIAGNOSIS — I5030 Unspecified diastolic (congestive) heart failure: Secondary | ICD-10-CM | POA: Diagnosis not present

## 2015-07-23 DIAGNOSIS — R69 Illness, unspecified: Secondary | ICD-10-CM | POA: Diagnosis not present
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
## 2015-07-24 DIAGNOSIS — I679 Cerebrovascular disease, unspecified: Secondary | ICD-10-CM | POA: Diagnosis not present

## 2015-07-24 DIAGNOSIS — R569 Unspecified convulsions: Secondary | ICD-10-CM | POA: Diagnosis not present

## 2015-07-24 DIAGNOSIS — R269 Unspecified abnormalities of gait and mobility: Secondary | ICD-10-CM | POA: Diagnosis not present

## 2015-07-26 DIAGNOSIS — I110 Hypertensive heart disease with heart failure: Secondary | ICD-10-CM | POA: Diagnosis not present

## 2015-07-26 DIAGNOSIS — C189 Malignant neoplasm of colon, unspecified: Secondary | ICD-10-CM | POA: Diagnosis not present

## 2015-07-26 DIAGNOSIS — Z483 Aftercare following surgery for neoplasm: Secondary | ICD-10-CM | POA: Diagnosis not present

## 2015-07-26 DIAGNOSIS — I5030 Unspecified diastolic (congestive) heart failure: Secondary | ICD-10-CM | POA: Diagnosis not present

## 2015-07-26 DIAGNOSIS — R69 Illness, unspecified: Secondary | ICD-10-CM | POA: Diagnosis not present

## 2015-08-01 DIAGNOSIS — Z483 Aftercare following surgery for neoplasm: Secondary | ICD-10-CM | POA: Diagnosis not present

## 2015-08-01 DIAGNOSIS — I110 Hypertensive heart disease with heart failure: Secondary | ICD-10-CM | POA: Diagnosis not present

## 2015-08-01 DIAGNOSIS — C189 Malignant neoplasm of colon, unspecified: Secondary | ICD-10-CM | POA: Diagnosis not present

## 2015-08-01 DIAGNOSIS — I5030 Unspecified diastolic (congestive) heart failure: Secondary | ICD-10-CM | POA: Diagnosis not present

## 2015-08-01 DIAGNOSIS — R69 Illness, unspecified: Secondary | ICD-10-CM | POA: Diagnosis not present

## 2015-08-02 ENCOUNTER — Ambulatory Visit
Admission: RE | Admit: 2015-08-02 | Discharge: 2015-08-02 | Disposition: A | Discharge status: Home / Self Care | Payer: Medicare HMO | Source: Ambulatory Visit

## 2015-08-02 DIAGNOSIS — Z1231 Encounter for screening mammogram for malignant neoplasm of breast: Secondary | ICD-10-CM

## 2015-08-03 DIAGNOSIS — I5030 Unspecified diastolic (congestive) heart failure: Secondary | ICD-10-CM | POA: Diagnosis not present

## 2015-08-03 DIAGNOSIS — Z483 Aftercare following surgery for neoplasm: Secondary | ICD-10-CM | POA: Diagnosis not present

## 2015-08-03 DIAGNOSIS — I110 Hypertensive heart disease with heart failure: Secondary | ICD-10-CM | POA: Diagnosis not present

## 2015-08-03 DIAGNOSIS — C189 Malignant neoplasm of colon, unspecified: Secondary | ICD-10-CM | POA: Diagnosis not present

## 2015-08-03 DIAGNOSIS — R69 Illness, unspecified: Secondary | ICD-10-CM | POA: Diagnosis not present

## 2015-08-07 DIAGNOSIS — R69 Illness, unspecified: Secondary | ICD-10-CM | POA: Diagnosis not present

## 2015-08-07 DIAGNOSIS — C189 Malignant neoplasm of colon, unspecified: Secondary | ICD-10-CM | POA: Diagnosis not present

## 2015-08-07 DIAGNOSIS — I110 Hypertensive heart disease with heart failure: Secondary | ICD-10-CM | POA: Diagnosis not present

## 2015-08-07 DIAGNOSIS — I5030 Unspecified diastolic (congestive) heart failure: Secondary | ICD-10-CM | POA: Diagnosis not present

## 2015-08-07 DIAGNOSIS — Z483 Aftercare following surgery for neoplasm: Secondary | ICD-10-CM | POA: Diagnosis not present

## 2015-08-08 DIAGNOSIS — I110 Hypertensive heart disease with heart failure: Secondary | ICD-10-CM | POA: Diagnosis not present

## 2015-08-08 DIAGNOSIS — Z483 Aftercare following surgery for neoplasm: Secondary | ICD-10-CM | POA: Diagnosis not present

## 2015-08-08 DIAGNOSIS — C189 Malignant neoplasm of colon, unspecified: Secondary | ICD-10-CM | POA: Diagnosis not present

## 2015-08-08 DIAGNOSIS — I5030 Unspecified diastolic (congestive) heart failure: Secondary | ICD-10-CM | POA: Diagnosis not present

## 2015-08-08 DIAGNOSIS — R69 Illness, unspecified: Secondary | ICD-10-CM | POA: Diagnosis not present

## 2015-08-09 ENCOUNTER — Ambulatory Visit (INDEPENDENT_AMBULATORY_CARE_PROVIDER_SITE_OTHER): Payer: Medicare HMO | Admitting: Adult Health

## 2015-08-09 ENCOUNTER — Telehealth: Payer: Self-pay | Admitting: Adult Health

## 2015-08-09 ENCOUNTER — Encounter: Payer: Self-pay | Admitting: Adult Health

## 2015-08-09 VITALS — BP 120/80 | Temp 98.4°F | Ht <= 58 in | Wt 123.3 lb

## 2015-08-09 DIAGNOSIS — Z7689 Persons encountering health services in other specified circumstances: Secondary | ICD-10-CM

## 2015-08-09 DIAGNOSIS — E1149 Type 2 diabetes mellitus with other diabetic neurological complication: Secondary | ICD-10-CM

## 2015-08-09 DIAGNOSIS — R69 Illness, unspecified: Secondary | ICD-10-CM | POA: Diagnosis not present

## 2015-08-09 MED ORDER — SENNA 8.6 MG PO TABS: 1.0000 | ORAL_TABLET | Freq: Every day | ORAL | Status: DC

## 2015-08-09 MED ORDER — ASPIRIN EC 325 MG PO TBEC: 325.0000 mg | DELAYED_RELEASE_TABLET | Freq: Every day | ORAL | Status: AC

## 2015-08-09 MED ORDER — ONETOUCH DELICA LANCETS FINE MISC: Status: DC

## 2015-08-09 MED ORDER — ATORVASTATIN CALCIUM 20 MG PO TABS: 20.0000 mg | ORAL_TABLET | Freq: Every day | ORAL | Status: DC

## 2015-08-09 MED ORDER — METFORMIN HCL 500 MG PO TABS: 500.0000 mg | ORAL_TABLET | Freq: Two times a day (BID) | ORAL | Status: DC

## 2015-08-09 MED ORDER — GLIMEPIRIDE 2 MG PO TABS: 2.0000 mg | ORAL_TABLET | Freq: Every day | ORAL | Status: DC

## 2015-08-09 MED ORDER — ISOSORBIDE DINITRATE 5 MG PO TABS: 5.0000 mg | ORAL_TABLET | Freq: Three times a day (TID) | ORAL | Status: DC

## 2015-08-09 MED ORDER — FUROSEMIDE 40 MG PO TABS
ORAL_TABLET | ORAL | Status: DC
Start: 2015-08-09 — End: 2015-08-27

## 2015-08-09 MED ORDER — GLUCOSE BLOOD VI STRP: ORAL_STRIP | Status: DC

## 2015-08-09 NOTE — Patient Instructions (Signed)
It was great meeting you today!  Please check your blood sugar in the morning and at night. Write down the values and let me know what they are in two weeks.   Stop using the insulin at this time.   Follow up with me in Jan/Feb for your physical.

## 2015-08-09 NOTE — Telephone Encounter (Signed)
Rx sent to pharmacy   

## 2015-08-09 NOTE — Telephone Encounter (Signed)
April [name], the pt's niece called saying she's out of the following medications and is wondering if a Rx can be sent to her pharmacy for them: 1. Aspirin EC 2. Atorvastatin 3. Furosemide 4. Glimepiride 5. Isosorbide Dinitrate 6. Metformin HCL 7. Senokot  Please call April [name] if you have questions or concerns. She did say the pt hasn't taken three of the seven medications today due to not having them.   Ph ph# [phone] Thank you.

## 2015-08-09 NOTE — Progress Notes (Signed)
HPI:  April [name] is here to establish care. 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  Last PCP and physical: Two years ago with NP Conley Canal.   She was recently discharged from her short term rehab to home. She was recently hospitalized due to obstructing kidney stone and sepsis. CT scan obtained in the hospital showed a right-sided mesenteric mass concerning for malignancy or metastatic disease. She underwent a colonoscopy with Dr. Benson Norway and was found to have a terminal ileal mass. Biopsy showed neuroendocrine tumor, she underwent surgery on 05/03/2015.   Has the following chronic problems that require follow up and concerns today:  Diabetes  - was recently started on Levemir about a month ago for reasons that are unknown to her. She does not want to take insulin and would rather take PO medications for diabetes. She endorses that her blood sugars were not high in the past when she was not taking insulin.   S/P stroke - march 2016 she was diagnosed with right thalamus infarct. She ndorses that this is because " I didn't take any of my medications." She reports doing well since the stroke. She is walking with a rolling walker and has not had any recent falls.   ROS negative for unless reported above: fevers, chills,feeling poorly, unintentional weight loss, hearing or vision loss, chest pain, palpitations, leg claudication, struggling to breath,Not feeling congested in the chest, no  orthopenia, no cough,no wheezing, normal appetite, no soft tissue swelling, no hemoptysis, melena, hematochezia, hematuria, falls, loc, si, or thoughts of self harm.  Immunizations:UTD Diet:Does not follow a specific diet.  Exercise: Walks Colonoscopy: 2016  Dexa: Five years ago  Mammogram: 07/2015 - Normal   Is followed by 1. Dr. Leonie Man - Neuro 2. Dr. Valentina Shaggy - Psychiatry  3. Dr. [name] - Surgery  4. Dr. Franchot Gallo - Nephrology 5. Dr. Zadie Rhine - Diabetic Eye      Past Medical History  Diagnosis Date  . Diabetes mellitus   . Hypertension   . Retinopathy 06/06/2013    Per eye exam 05/31/13 eye exam, Moderate retinopathy   . Aphasia   . Seizures ([place])   . Rectum bleeding   . HYPERLIPIDEMIA 10/15/2006    Qualifier: Diagnosis of  By: Beryle Lathe    . GERD (gastroesophageal reflux disease) 05/06/2013  . Gram-positive cocci bacteremia   . 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 Cerebral infarction due to unspecified mechanism   . Uncontrolled type 2 diabetes mellitus with insulin therapy ([place]) 01/17/2015  . Chronic diastolic CHF (congestive heart failure), grade I 01/18/2015  . Family history of adverse reaction to anesthesia   . Stroke [facility]) 10/21/2014    Past Surgical History  Procedure Laterality Date  . Appendectomy  1972  . Abdominal hysterectomy  1972  . Tee without cardioversion N/A 10/24/2014    Procedure: TRANSESOPHAGEAL ECHOCARDIOGRAM (TEE);  Surgeon: Dorothy Spark, MD;  Location: [facility];  Service: Cardiovascular;  Laterality: N/A;  . Cystoscopy w/ ureteral stent placement Left 02/08/2015    Procedure: CYSTOSCOPY WITH RETROGRADE left PYELOGRAM/URETERAL STENT PLACEMENT left insertion of foley;  Surgeon: Franchot Gallo,  MD;  Location: [facility];  Service: Urology;  Laterality: Left;  . Colonoscopy with propofol N/A 04/06/2015    Procedure: COLONOSCOPY WITH PROPOFOL;  Surgeon: Carol Ada, MD;  Location: [facility];  Service:  Endoscopy;  Laterality: N/A;  . Left leg fracture      broken leg-left in 3 places  . Eye surgery  08/2014    bilateral cataract with lens implants  . Tonsillectomy      as child-age 27  . Laparoscopic partial colectomy N/A 05/03/2015    Procedure: LAPAROSCOPIC PARTIAL COLECTOMY;  Surgeon: [name], MD;  Location: [facility];  Service: General;  Laterality: N/A;    Family History  Problem Relation Age of Onset  . Arthritis Maternal Grandmother   . Heart disease Maternal Grandmother 10-Dec-1978  . Diabetes Mother   . Stomach cancer Mother     Social History   Social History  . Marital Status: Widowed    Spouse Name: N/A  . Number of Children: 1  . Years of Education: 13   Occupational History  . Retired    Social History Main Topics  . Smoking status: Never Smoker   . Smokeless tobacco: Never Used  . Alcohol Use: No  . Drug Use: No  . Sexual Activity: Not Asked   Other Topics Concern  . None   Social History Narrative   Widowed, husband died in 12-09-2001.   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   Right-handed.   1 cup caffeine per day.   She is being cared for by her two neices.        Current outpatient prescriptions:  .  Amino Acids-Protein Hydrolys (FEEDING SUPPLEMENT, PRO-STAT SUGAR FREE 64,) LIQD, Take 30 mLs by mouth 2 (two) times daily., Disp: , Rfl:  .  aspirin EC 325 MG tablet, Take 1 tablet (325 mg total) by mouth daily., Disp: 30 tablet, Rfl: 0 .  atorvastatin (LIPITOR) 20 MG tablet, Take 1 tablet (20 mg total) by mouth daily at 6 PM., Disp: , Rfl:  .  bisacodyl (DULCOLAX) 10 MG suppository, Place 1 suppository (10 mg total) rectally daily as needed for moderate constipation., Disp: 12 suppository, Rfl: 0 .  carvedilol (COREG) 3.125 MG tablet, Take 1 tablet (3.125 mg total) by mouth 2 (two) times daily  with a meal., Disp: , Rfl:  .  furosemide (LASIX) 40 MG tablet, Take 1 tablet daily. If you gain > 3lbs in 1 day or 5 lbs in 1 week, take an extra dose., Disp: 60 tablet, Rfl:  .  glimepiride (AMARYL) 2 MG tablet, Take 1 tablet (2 mg total) by mouth daily with breakfast., Disp: , Rfl:  .  insulin detemir (LEVEMIR) 100 UNIT/ML injection, Inject 0.1 mLs (10 Units total) into the skin daily., Disp: 10 mL, Rfl: 0 .  isosorbide dinitrate (ISORDIL) 5 MG tablet, Take 1 tablet (5 mg total) by mouth 3 (three) times daily. Take 1 tablet by mouth three times a day, Disp: 30 tablet, Rfl: 2 .  levETIRAcetam (KEPPRA) 500 MG tablet, Take 1 tablet (500 mg total) by mouth 2 (two) times daily., Disp: , Rfl:  .  metFORMIN (GLUCOPHAGE) 500 MG tablet, Take by mouth 2 (two) times daily with a meal., Disp: , Rfl:  .  omeprazole (PRILOSEC) 20 MG capsule, Take  20 mg by mouth daily., Disp: , Rfl:  .  potassium chloride SA (K-DUR,KLOR-CON) 20 MEQ tablet, Take 2 tablets (40 mEq total) by mouth daily. (Patient not taking: Reported on 08/09/2015), Disp: 30 tablet, Rfl: 0 .  saccharomyces boulardii (FLORASTOR) 250 MG capsule, Take 1 capsule (250 mg total) by mouth 2 (two) times daily., Disp: 30 capsule, Rfl: 0 .  senna (SENOKOT) 8.6 MG TABS tablet, Take 1 tablet by mouth at bedtime., Disp: , Rfl:   EXAM:  Filed Vitals:   08/09/15 0959  BP: 120/80  Temp: 98.4 F (36.9 C)    Body mass index is 26.67 kg/(m^2).  GENERAL: vitals reviewed and listed above, alert, oriented, appears well hydrated and in no acute distress  HEENT: atraumatic, conjunttiva clear, no obvious abnormalities on inspection of external nose and ears  NECK: Neck is soft and supple without masses, no adenopathy or thyromegaly, trachea midline, no JVD. Normal range of motion.   LUNGS: clear to auscultation bilaterally, no wheezes, rales or rhonchi, good air movement  CV: Regular rate and rhythm, normal S1/S2, no audible murmurs, gallops, or rubs. No  carotid bruit and no peripheral edema.   MS: moves all extremities without noticeable abnormality. No edema noted. Strong grip strength in both hands. Very little residual from stoke.   Abd: soft/nontender/nondistended/normal bowel sounds   Skin: warm and dry, no rash   Extremities: No clubbing, cyanosis, or edema. Capillary refill is WNL. Pulses intact bilaterally in upper and lower extremities.   Neuro: CN II-XII intact, sensation and reflexes normal throughout, 5/5 muscle strength in bilateral upper and lower extremities.  Normal rapid alternating movements. Normal romberg. No pronator drift.   PSYCH: pleasant and cooperative, no obvious depression or anxiety  ASSESSMENT AND PLAN:  1. Encounter to establish care - Follow up in one month for CPE - Follow up sooner if needed - Continue to walk as much as possible.   2. Type II diabetes mellitus with neurological manifestations ([place]) -Glucometer given. She is to check her blood sugars twice a day. Record in log and let me know what they are in two weeks. Advised to just take oral diabetes medications at this time. Will reconsider insulin if needed.  - Follow a diabetic diet  -We reviewed the PMH, PSH, FH, SH, Meds and Allergies. -We provided refills for any medications we will prescribe as needed. -We addressed current concerns per orders and patient instructions. -We have asked for records for pertinent exams, studies, vaccines and notes from previous providers. -We have advised patient to follow up per instructions below.   -Patient advised to return or notify a provider immediately if symptoms worsen or persist or new concerns arise.   Dorothyann Peng, AGNP

## 2015-08-09 NOTE — Addendum Note (Signed)
Addended by: Colleen Can on: 08/09/2015 12:28 PM   Modules accepted: Orders

## 2015-08-09 NOTE — Progress Notes (Signed)
Pre visit review using our clinic review tool, if applicable. No additional management support is needed unless otherwise documented below in the visit note. 

## 2015-08-10 DIAGNOSIS — I110 Hypertensive heart disease with heart failure: Secondary | ICD-10-CM | POA: Diagnosis not present

## 2015-08-10 DIAGNOSIS — I5030 Unspecified diastolic (congestive) heart failure: Secondary | ICD-10-CM | POA: Diagnosis not present

## 2015-08-10 DIAGNOSIS — Z483 Aftercare following surgery for neoplasm: Secondary | ICD-10-CM | POA: Diagnosis not present

## 2015-08-10 DIAGNOSIS — C189 Malignant neoplasm of colon, unspecified: Secondary | ICD-10-CM | POA: Diagnosis not present

## 2015-08-10 DIAGNOSIS — R69 Illness, unspecified: Secondary | ICD-10-CM | POA: Diagnosis not present

## 2015-08-14 ENCOUNTER — Telehealth: Payer: Self-pay | Admitting: Adult Health

## 2015-08-14 DIAGNOSIS — Z483 Aftercare following surgery for neoplasm: Secondary | ICD-10-CM | POA: Diagnosis not present

## 2015-08-14 DIAGNOSIS — I110 Hypertensive heart disease with heart failure: Secondary | ICD-10-CM | POA: Diagnosis not present

## 2015-08-14 DIAGNOSIS — R69 Illness, unspecified: Secondary | ICD-10-CM | POA: Diagnosis not present

## 2015-08-14 DIAGNOSIS — I5030 Unspecified diastolic (congestive) heart failure: Secondary | ICD-10-CM | POA: Diagnosis not present

## 2015-08-14 DIAGNOSIS — C189 Malignant neoplasm of colon, unspecified: Secondary | ICD-10-CM | POA: Diagnosis not present

## 2015-08-14 NOTE — Telephone Encounter (Signed)
error:[id number] ° °

## 2015-08-15 DIAGNOSIS — R69 Illness, unspecified: Secondary | ICD-10-CM | POA: Diagnosis not present

## 2015-08-15 DIAGNOSIS — I5030 Unspecified diastolic (congestive) heart failure: Secondary | ICD-10-CM | POA: Diagnosis not present

## 2015-08-15 DIAGNOSIS — C189 Malignant neoplasm of colon, unspecified: Secondary | ICD-10-CM | POA: Diagnosis not present

## 2015-08-15 DIAGNOSIS — I110 Hypertensive heart disease with heart failure: Secondary | ICD-10-CM | POA: Diagnosis not present

## 2015-08-15 DIAGNOSIS — Z483 Aftercare following surgery for neoplasm: Secondary | ICD-10-CM | POA: Diagnosis not present

## 2015-08-16 DIAGNOSIS — C189 Malignant neoplasm of colon, unspecified: Secondary | ICD-10-CM | POA: Diagnosis not present

## 2015-08-16 DIAGNOSIS — R339 Retention of urine, unspecified: Secondary | ICD-10-CM | POA: Diagnosis not present

## 2015-08-16 DIAGNOSIS — R69 Illness, unspecified: Secondary | ICD-10-CM | POA: Diagnosis not present

## 2015-08-16 DIAGNOSIS — I5030 Unspecified diastolic (congestive) heart failure: Secondary | ICD-10-CM | POA: Diagnosis not present

## 2015-08-16 DIAGNOSIS — I110 Hypertensive heart disease with heart failure: Secondary | ICD-10-CM | POA: Diagnosis not present

## 2015-08-16 DIAGNOSIS — Z483 Aftercare following surgery for neoplasm: Secondary | ICD-10-CM | POA: Diagnosis not present

## 2015-08-21 DIAGNOSIS — I110 Hypertensive heart disease with heart failure: Secondary | ICD-10-CM | POA: Diagnosis not present

## 2015-08-21 DIAGNOSIS — I5030 Unspecified diastolic (congestive) heart failure: Secondary | ICD-10-CM | POA: Diagnosis not present

## 2015-08-21 DIAGNOSIS — C189 Malignant neoplasm of colon, unspecified: Secondary | ICD-10-CM | POA: Diagnosis not present

## 2015-08-21 DIAGNOSIS — Z483 Aftercare following surgery for neoplasm: Secondary | ICD-10-CM | POA: Diagnosis not present

## 2015-08-21 DIAGNOSIS — R69 Illness, unspecified: Secondary | ICD-10-CM | POA: Diagnosis not present

## 2015-08-23 DIAGNOSIS — Z87448 Personal history of other diseases of urinary system: Secondary | ICD-10-CM | POA: Diagnosis not present

## 2015-08-23 DIAGNOSIS — C7A019 Malignant carcinoid tumor of the small intestine, unspecified portion: Secondary | ICD-10-CM | POA: Diagnosis not present

## 2015-08-23 DIAGNOSIS — I110 Hypertensive heart disease with heart failure: Secondary | ICD-10-CM | POA: Diagnosis not present

## 2015-08-23 DIAGNOSIS — R69 Illness, unspecified: Secondary | ICD-10-CM | POA: Diagnosis not present

## 2015-08-23 DIAGNOSIS — Z483 Aftercare following surgery for neoplasm: Secondary | ICD-10-CM | POA: Diagnosis not present

## 2015-08-23 DIAGNOSIS — I5030 Unspecified diastolic (congestive) heart failure: Secondary | ICD-10-CM | POA: Diagnosis not present

## 2015-08-23 DIAGNOSIS — C189 Malignant neoplasm of colon, unspecified: Secondary | ICD-10-CM | POA: Diagnosis not present

## 2015-08-24 DIAGNOSIS — C7A019 Malignant carcinoid tumor of the small intestine, unspecified portion: Secondary | ICD-10-CM | POA: Diagnosis not present

## 2015-08-24 DIAGNOSIS — R269 Unspecified abnormalities of gait and mobility: Secondary | ICD-10-CM | POA: Diagnosis not present

## 2015-08-24 DIAGNOSIS — I679 Cerebrovascular disease, unspecified: Secondary | ICD-10-CM | POA: Diagnosis not present

## 2015-08-24 DIAGNOSIS — R569 Unspecified convulsions: Secondary | ICD-10-CM | POA: Diagnosis not present

## 2015-08-24 DIAGNOSIS — C7A8 Other malignant neuroendocrine tumors: Secondary | ICD-10-CM | POA: Diagnosis not present

## 2015-08-26 DIAGNOSIS — N390 Urinary tract infection, site not specified: Secondary | ICD-10-CM | POA: Diagnosis not present

## 2015-08-26 DIAGNOSIS — E119 Type 2 diabetes mellitus without complications: Secondary | ICD-10-CM | POA: Diagnosis not present

## 2015-08-26 DIAGNOSIS — Z466 Encounter for fitting and adjustment of urinary device: Secondary | ICD-10-CM | POA: Diagnosis not present

## 2015-08-26 DIAGNOSIS — I69320 Aphasia following cerebral infarction: Secondary | ICD-10-CM | POA: Diagnosis not present

## 2015-08-27 ENCOUNTER — Telehealth: Payer: Self-pay | Admitting: Adult Health

## 2015-08-27 DIAGNOSIS — N390 Urinary tract infection, site not specified: Secondary | ICD-10-CM | POA: Diagnosis not present

## 2015-08-27 DIAGNOSIS — I69320 Aphasia following cerebral infarction: Secondary | ICD-10-CM | POA: Diagnosis not present

## 2015-08-27 DIAGNOSIS — I110 Hypertensive heart disease with heart failure: Secondary | ICD-10-CM | POA: Diagnosis not present

## 2015-08-27 DIAGNOSIS — E119 Type 2 diabetes mellitus without complications: Secondary | ICD-10-CM | POA: Diagnosis not present

## 2015-08-27 DIAGNOSIS — Z466 Encounter for fitting and adjustment of urinary device: Secondary | ICD-10-CM | POA: Diagnosis not present

## 2015-08-27 MED ORDER — SACCHAROMYCES BOULARDII 250 MG PO CAPS: 250.0000 mg | ORAL_CAPSULE | Freq: Two times a day (BID) | ORAL | Status: DC

## 2015-08-27 MED ORDER — LEVETIRACETAM 500 MG PO TABS: 500.0000 mg | ORAL_TABLET | Freq: Two times a day (BID) | ORAL | Status: DC

## 2015-08-27 MED ORDER — CARVEDILOL 3.125 MG PO TABS: 3.2500 mg | ORAL_TABLET | Freq: Two times a day (BID) | ORAL | Status: DC

## 2015-08-27 MED ORDER — FUROSEMIDE 40 MG PO TABS: ORAL_TABLET | ORAL | Status: DC

## 2015-08-27 MED ORDER — GLIMEPIRIDE 2 MG PO TABS: 2.0000 mg | ORAL_TABLET | Freq: Every day | ORAL | Status: DC

## 2015-08-27 NOTE — Telephone Encounter (Signed)
Prescriptions sent to pharmacy

## 2015-08-27 NOTE — Telephone Encounter (Signed)
Pt needs refills on levetiriacetam 500mg , carvedilol 25, gilimepride 2mg , furosemide 40mg , florastore 250 mg all sent [facility] on [address]. She needs these sent in today of possible.

## 2015-08-29 DIAGNOSIS — I110 Hypertensive heart disease with heart failure: Secondary | ICD-10-CM | POA: Diagnosis not present

## 2015-08-29 DIAGNOSIS — Z466 Encounter for fitting and adjustment of urinary device: Secondary | ICD-10-CM | POA: Diagnosis not present

## 2015-08-29 DIAGNOSIS — E119 Type 2 diabetes mellitus without complications: Secondary | ICD-10-CM | POA: Diagnosis not present

## 2015-08-29 DIAGNOSIS — I69320 Aphasia following cerebral infarction: Secondary | ICD-10-CM | POA: Diagnosis not present

## 2015-08-29 DIAGNOSIS — N390 Urinary tract infection, site not specified: Secondary | ICD-10-CM | POA: Diagnosis not present

## 2015-08-30 DIAGNOSIS — N390 Urinary tract infection, site not specified: Secondary | ICD-10-CM | POA: Diagnosis not present

## 2015-08-30 DIAGNOSIS — I110 Hypertensive heart disease with heart failure: Secondary | ICD-10-CM | POA: Diagnosis not present

## 2015-08-30 DIAGNOSIS — Z466 Encounter for fitting and adjustment of urinary device: Secondary | ICD-10-CM | POA: Diagnosis not present

## 2015-08-30 DIAGNOSIS — I69320 Aphasia following cerebral infarction: Secondary | ICD-10-CM | POA: Diagnosis not present

## 2015-08-30 DIAGNOSIS — E119 Type 2 diabetes mellitus without complications: Secondary | ICD-10-CM | POA: Diagnosis not present

## 2015-08-31 DIAGNOSIS — R338 Other retention of urine: Secondary | ICD-10-CM | POA: Diagnosis not present

## 2015-09-04 DIAGNOSIS — I69320 Aphasia following cerebral infarction: Secondary | ICD-10-CM | POA: Diagnosis not present

## 2015-09-04 DIAGNOSIS — N390 Urinary tract infection, site not specified: Secondary | ICD-10-CM | POA: Diagnosis not present

## 2015-09-04 DIAGNOSIS — Z466 Encounter for fitting and adjustment of urinary device: Secondary | ICD-10-CM | POA: Diagnosis not present

## 2015-09-04 DIAGNOSIS — I110 Hypertensive heart disease with heart failure: Secondary | ICD-10-CM | POA: Diagnosis not present

## 2015-09-04 DIAGNOSIS — E119 Type 2 diabetes mellitus without complications: Secondary | ICD-10-CM | POA: Diagnosis not present

## 2015-09-05 DIAGNOSIS — I69320 Aphasia following cerebral infarction: Secondary | ICD-10-CM | POA: Diagnosis not present

## 2015-09-05 DIAGNOSIS — Z466 Encounter for fitting and adjustment of urinary device: Secondary | ICD-10-CM | POA: Diagnosis not present

## 2015-09-05 DIAGNOSIS — N390 Urinary tract infection, site not specified: Secondary | ICD-10-CM | POA: Diagnosis not present

## 2015-09-05 DIAGNOSIS — I110 Hypertensive heart disease with heart failure: Secondary | ICD-10-CM | POA: Diagnosis not present

## 2015-09-05 DIAGNOSIS — E119 Type 2 diabetes mellitus without complications: Secondary | ICD-10-CM | POA: Diagnosis not present

## 2015-09-06 ENCOUNTER — Telehealth: Payer: Self-pay

## 2015-09-06 DIAGNOSIS — I110 Hypertensive heart disease with heart failure: Secondary | ICD-10-CM | POA: Diagnosis not present

## 2015-09-06 DIAGNOSIS — I69320 Aphasia following cerebral infarction: Secondary | ICD-10-CM | POA: Diagnosis not present

## 2015-09-06 DIAGNOSIS — Z466 Encounter for fitting and adjustment of urinary device: Secondary | ICD-10-CM | POA: Diagnosis not present

## 2015-09-06 DIAGNOSIS — E119 Type 2 diabetes mellitus without complications: Secondary | ICD-10-CM | POA: Diagnosis not present

## 2015-09-06 DIAGNOSIS — N390 Urinary tract infection, site not specified: Secondary | ICD-10-CM | POA: Diagnosis not present

## 2015-09-06 NOTE — Telephone Encounter (Signed)
Received a call from [facility] stating pt wanted to cancel her nursing visit for this week.  Pt had her foley catheter taken out last Friday and has been doing well.  Nira Conn is scheduled to see pt next week and she will discharge for nursing then.     Ok per [place] to discharge patient if no further assistance is needed.   Nira Conn is aware.

## 2015-09-10 DIAGNOSIS — E119 Type 2 diabetes mellitus without complications: Secondary | ICD-10-CM | POA: Diagnosis not present

## 2015-09-10 DIAGNOSIS — I69320 Aphasia following cerebral infarction: Secondary | ICD-10-CM | POA: Diagnosis not present

## 2015-09-10 DIAGNOSIS — N390 Urinary tract infection, site not specified: Secondary | ICD-10-CM | POA: Diagnosis not present

## 2015-09-10 DIAGNOSIS — I110 Hypertensive heart disease with heart failure: Secondary | ICD-10-CM | POA: Diagnosis not present

## 2015-09-10 DIAGNOSIS — Z466 Encounter for fitting and adjustment of urinary device: Secondary | ICD-10-CM | POA: Diagnosis not present

## 2015-09-11 DIAGNOSIS — N390 Urinary tract infection, site not specified: Secondary | ICD-10-CM | POA: Diagnosis not present

## 2015-09-11 DIAGNOSIS — E119 Type 2 diabetes mellitus without complications: Secondary | ICD-10-CM | POA: Diagnosis not present

## 2015-09-11 DIAGNOSIS — I69320 Aphasia following cerebral infarction: Secondary | ICD-10-CM | POA: Diagnosis not present

## 2015-09-11 DIAGNOSIS — I110 Hypertensive heart disease with heart failure: Secondary | ICD-10-CM | POA: Diagnosis not present

## 2015-09-11 DIAGNOSIS — Z466 Encounter for fitting and adjustment of urinary device: Secondary | ICD-10-CM | POA: Diagnosis not present

## 2015-09-13 DIAGNOSIS — E119 Type 2 diabetes mellitus without complications: Secondary | ICD-10-CM | POA: Diagnosis not present

## 2015-09-13 DIAGNOSIS — I69320 Aphasia following cerebral infarction: Secondary | ICD-10-CM | POA: Diagnosis not present

## 2015-09-13 DIAGNOSIS — N390 Urinary tract infection, site not specified: Secondary | ICD-10-CM | POA: Diagnosis not present

## 2015-09-13 DIAGNOSIS — I110 Hypertensive heart disease with heart failure: Secondary | ICD-10-CM | POA: Diagnosis not present

## 2015-09-13 DIAGNOSIS — Z466 Encounter for fitting and adjustment of urinary device: Secondary | ICD-10-CM | POA: Diagnosis not present

## 2015-09-14 DIAGNOSIS — N390 Urinary tract infection, site not specified: Secondary | ICD-10-CM | POA: Diagnosis not present

## 2015-09-14 DIAGNOSIS — R338 Other retention of urine: Secondary | ICD-10-CM | POA: Diagnosis not present

## 2015-09-14 DIAGNOSIS — Z0000 Encounter for general adult medical examination without abnormal findings: Secondary | ICD-10-CM | POA: Diagnosis not present

## 2015-09-20 ENCOUNTER — Encounter: Payer: Self-pay | Admitting: Neurology

## 2015-09-20 ENCOUNTER — Ambulatory Visit (INDEPENDENT_AMBULATORY_CARE_PROVIDER_SITE_OTHER): Payer: Medicare HMO | Admitting: Neurology

## 2015-09-20 VITALS — BP 161/83 | HR 82 | Ht 59.0 in | Wt 121.8 lb

## 2015-09-20 DIAGNOSIS — R569 Unspecified convulsions: Secondary | ICD-10-CM | POA: Diagnosis not present

## 2015-09-20 NOTE — Patient Instructions (Signed)
I had a long discussion with the patient and her health power of attorney [name] regarding her remote stroke as well as seizures both of which appear to be quite stable. I recommend she continue on aspirin for stroke prevention and maintain strict control of hypertension with blood pressure goal below 130/90 and lipids with LDL cholesterol goal below 70 mg percent and diabetes with hemoglobin A1c goal below 6.5%. Continue Keppra 500 mg twice daily for seizures. She was advised to have regular follow-up with her primary care physician and have lipid profile and hemoglobin A1c checked at next visit. She was also counseled to use a walker at all times and fall prevention .She will return for follow-up in 6 months with Gilford Raid, nurse practitioner or call earlier if necessary

## 2015-09-20 NOTE — Progress Notes (Signed)
PATIENT: April [name] DOB: 18-Sep-1940  HISTORICAL  April [name] is a 75 years old female, referred by her primary care nurse practitioner Debbrah Alar, accompanied by her niece Meriel Flavors, who is in this process of become her power of attorney, and her niece Zigmund Daniel at today's evaluation,  She had past medical history of hypertension, hyperlipidemia, diabetes, most recent A1c was 10.7, she was admitted to the hospital March 5 to October 25 2014, after she was found by her neighbor that she is confused  Had extensive evaluation during her hospital stay, EEG October 23 2014 showed buildup of occipital spike and slow wave activity from the left  occipital region. It achieves a frequency of 3-4 Hz with some spread noted to the right hemisphere as well. These episodes last from 100 to 155 seconds with normal awake background  activity returning after these periods. Suggestive of subclinical seizure, she was put on Keppra 500 mg twice a day, repeat EEG October 25 2014, showed significant improvement  Echocardiogram ejection fraction 60%, ultrasound of carotid artery showed less than 39% stenosis, anterograde flow of bilateral vertebral system   I have reviewed MRI of brain, MRA of the brain October 21 2014, MRI of the brain October 23 2014: 5 mm focus of restricted diffusion within the inferior right thalamus as above, highly suspicious for a small acute ischemic  infarct. Generalized cerebral atrophy with moderate chronic microvascular ischemic disease. There was no significant medium large size vessel disease by MRA of the brain, Laboratory evaluation showed  LDL 46, A1c 10.7, normal vitamin B12, TSH,   Before she was admitted to the hospital, she lives by herself, has widowed for many years, has no relationship to her only son, was still driving, but he was noticed by her nieces, who intact with her frequently, she has gradual onset memory trouble, also moody, tends to cry, she has been  missing her diabetic, hypertension, hyperlipidemia medication regularly,   Since hospital admission, she was discharged to [facility] place, continue has intermittent memory loss, mild confusion, in addition, she was noted to have increased gait difficulty, urinary urgency, occasionally bladder accident, constipation, with laxative, she occasionally has bowel incontinence, contributed to not able to make to the bathroom by Asst. in timely fashion,   She is tearful emotional during today's interview, she will be moved to a long-term assisted living, her niece is in the process of getting power of attorney  Update 03/23/2015 : She is accompanied today by her daughter following her last visit with Dr. Krista Blue in March 2016. She reports overall significant improvement in her confusion and memory difficulties. She's had no recurrent stroke or TIA symptoms since my last hospital visit with her in March 2015 when she had a small right thalamic infarct. She also had a urinary tract infection at that time. Patient is now been diagnosed with a new colonic mass and plans to see GI surgeon Dr. Marcello Moores for consideration for surgery. She is presently living in assisted living and has recently started walking with a walker and is doing well. She is tolerating Keppra well without any side effects. She has had no recurrent seizure episodes. She states her blood pressure is normal and today it is slightly low at 103/67. Her fasting sugars have ranged consistently in the 90 and today was 93 mg percent on Mini-Mental status exam today she scored 29/30.Marland Kitchen Update 09/20/2015 : She returns for follow-up after last visit 6 months ago. She is doing well  she's had no recurrent seizures. She remains on Keppra. She's had no TIA or stroke symptoms. She lives alone at home. She has some help with household work and her medications. She feels her memory is stable. Blood pressure is usually well controlled though it is elevated at 161/83 today. She has  regular follow-up with her primary care physician. She has chronic urinary tract infections and in fact just finished a course of antibiotics. She is currently significantly urologist Dr. Diona Fanti for that. She plans on getting her annual physical to the end of this month and will have lipid profile checked at that time. She is also seen Dr. Valentina Shaggy last month. REVIEW OF SYSTEMS: Full 14 system review of systems performed and notable only for  fatigue, night sweats sensitivity, constipation, incontinence of bladder, difficulty urinating and all other systems negative  ALLERGIES: Allergies  Allergen Reactions  . Codeine Nausea And Vomiting  . Sulfa Antibiotics Nausea And Vomiting    HOME MEDICATIONS: Current Outpatient Prescriptions  Medication Sig Dispense Refill  . aspirin EC 325 MG tablet Take 1 tablet (325 mg total) by mouth daily. 30 tablet 5  . atorvastatin (LIPITOR) 20 MG tablet Take 1 tablet (20 mg total) by mouth daily at 6 PM. 30 tablet 5  . baclofen (LIORESAL) 10 MG tablet Take 10 mg by mouth as needed for muscle spasms.    . bisacodyl (DULCOLAX) 10 MG suppository Place 1 suppository (10 mg total) rectally daily as needed for moderate constipation. 12 suppository 0  . carvedilol (COREG) 3.125 MG tablet Take 1 tablet (3.125 mg total) by mouth 2 (two) times daily with a meal. 180 tablet 0  . furosemide (LASIX) 40 MG tablet Take 1 tablet daily. If you gain > 3lbs in 1 day or 5 lbs in 1 week, take an extra dose. 180 tablet 0  . glimepiride (AMARYL) 2 MG tablet Take 1 tablet (2 mg total) by mouth daily with breakfast. 90 tablet 0  . glucose blood (ONETOUCH VERIO) test strip Test twice daily. 100 each 5  . isosorbide dinitrate (ISORDIL) 5 MG tablet Take 1 tablet (5 mg total) by mouth 3 (three) times daily. Take 1 tablet by mouth three times a day 90 tablet 5  . levETIRAcetam (KEPPRA) 500 MG tablet Take 1 tablet (500 mg total) by mouth 2 (two) times daily. 180 tablet 0  . metFORMIN  (GLUCOPHAGE) 500 MG tablet Take 1 tablet (500 mg total) by mouth 2 (two) times daily with a meal. 60 tablet 5  . omeprazole (PRILOSEC) 20 MG capsule Take 20 mg by mouth daily.    . ondansetron (ZOFRAN-ODT) 4 MG disintegrating tablet Take 4 mg by mouth every 8 (eight) hours as needed for nausea or vomiting.    Glory Rosebush DELICA LANCETS FINE MISC Test twice daily. 100 each 5  . saccharomyces boulardii (FLORASTOR) 250 MG capsule Take 1 capsule (250 mg total) by mouth 2 (two) times daily. 90 capsule 0  . senna (SENOKOT) 8.6 MG TABS tablet Take 1 tablet (8.6 mg total) by mouth at bedtime. 120 each 1  . traMADol (ULTRAM) 50 MG tablet Take by mouth every 6 (six) hours as needed.     No current facility-administered medications for this visit.    PAST MEDICAL HISTORY: Past Medical History  Diagnosis Date  . Diabetes mellitus   . Hypertension   . Retinopathy 06/06/2013    Per eye exam 05/31/13 eye exam, Moderate retinopathy   . Aphasia   . Seizures ([place])   .  Rectum bleeding   . HYPERLIPIDEMIA 10/15/2006    Qualifier: Diagnosis of  By: Beryle Lathe    . GERD (gastroesophageal reflux disease) 05/06/2013  . Gram-positive cocci bacteremia   . 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 Cerebral infarction due to unspecified mechanism   . Chronic diastolic CHF (congestive heart failure), grade I 01/18/2015    ?   Marland Kitchen Family history of adverse reaction to anesthesia   . Stroke [facility]) 10/21/2014    PAST SURGICAL HISTORY: Past Surgical History  Procedure Laterality Date  . Appendectomy  11-30-70  . Abdominal hysterectomy  11-30-1970  . Tee without cardioversion N/A 10/24/2014    Procedure: TRANSESOPHAGEAL ECHOCARDIOGRAM (TEE);  Surgeon: Dorothy Spark, MD;  Location: [facility];  Service: Cardiovascular;  Laterality: N/A;  . Cystoscopy w/ ureteral stent placement Left 02/08/2015    Procedure: CYSTOSCOPY WITH RETROGRADE left PYELOGRAM/URETERAL STENT PLACEMENT left insertion of foley;   Surgeon: Franchot Gallo, MD;  Location: [facility];  Service: Urology;  Laterality: Left;  . Colonoscopy with propofol N/A 04/06/2015    Procedure: COLONOSCOPY WITH PROPOFOL;  Surgeon: Carol Ada, MD;  Location: [facility];  Service: Endoscopy;  Laterality: N/A;  . Left leg fracture      broken leg-left in 3 places  . Eye surgery  08/2014    bilateral cataract with lens implants  . Tonsillectomy      as child-age 81  . Laparoscopic partial colectomy N/A 05/03/2015    Procedure: LAPAROSCOPIC PARTIAL COLECTOMY;  Surgeon: [name], MD;  Location: [facility];  Service: General;  Laterality: N/A;    FAMILY HISTORY: Family History  Problem Relation Age of Onset  . Arthritis Maternal Grandmother   . Heart disease Maternal Grandmother 11-30-1978  . Diabetes Mother   . Stomach cancer Mother     SOCIAL HISTORY:  History   Social History  . Marital Status: Widowed    Spouse Name: N/A  . Number of Children: 1  . Years of Education: 13   Occupational History  . Retired    Social History Main Topics  . Smoking status: Never Smoker   . Smokeless tobacco: Never Used  . Alcohol Use: No  . Drug Use: No  . Sexual Activity: Not on file   Other Topics Concern  . Not on file   Social History Narrative   Widowed, husband died in 2001-11-29.   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   Right-handed.   1 cup caffeine per day.   She is being cared for by her two neices.        PHYSICAL EXAM   Filed Vitals:   09/20/15 0836  BP: 161/83  Pulse: 82  Height: 4\' 11"  (1.499 m)  Weight: 121 lb 12.8 oz (55.248 kg)    Not recorded      Body mass index is 24.59 kg/(m^2).  PHYSICAL EXAMNIATION:  Gen: NAD, conversant, well nourised, obese, well groomed                     Cardiovascular: Regular rate rhythm, no peripheral edema,  warm, nontender. Eyes: Conjunctivae clear without exudates or hemorrhage Neck: Supple, no carotid bruise. Pulmonary: Clear to auscultation bilaterally   NEUROLOGICAL EXAM:  MENTAL STATUS: Speech:    Speech is normal; fluent  and spontaneous with normal comprehension.  Cognition:Mini-Mental Status Examination is 29out of 30 . Clock drawing 2/4    The patient is oriented to person, place, and time;     recent and remote memory intact;     language fluent;     normal attention, concentration,     fund of knowledge.  CRANIAL NERVES: CN II: Visual fields are full to confrontation. Fundoscopic exam is normal with sharp discs and no vascular changes. Venous pulsations are present bilaterally. Pupils are 4 mm and briskly reactive to light. Visual acuity is 20/20 bilaterally. CN III, IV, VI: extraocular movement are normal. No ptosis. CN V: Facial sensation is intact to pinprick in all 3 divisions bilaterally. Corneal responses are intact.  CN VII: Face is symmetric with normal eye closure and smile. CN VIII: Hearing is normal to rubbing fingers CN IX, X: Palate elevates symmetrically. Phonation is normal. CN XI: Head turning and shoulder shrug are intact CN XII: Tongue is midline with normal movements and no atrophy.  MOTOR: There is no pronator drift of out-stretched arms. Muscle bulk and tone are normal. Muscle strength is normal.   Shoulder abduction Shoulder external rotation Elbow flexion Elbow extension Wrist flexion Wrist extension Finger abduction Hip flexion Knee flexion Knee extension Ankle dorsi flexion Ankle plantar flexion  R 5 5 5 5 5 5 5 5 5 5  4 5   L 5 5 5 5 5 5 5 5 5 5  4 5     REFLEXES: Reflexes are 2+ and symmetric at the biceps, triceps, knees, and ankles. Plantar responses are flexor.  SENSORY: Bilateral lower extremity pitting edema, mildly length dependent decreased pinprick, vibratory sensation at toes,  Coordination : Rapid alternating movements and fine  finger movements are intact. There is no dysmetria on finger-to-nose and heel-knee-shin. There are no abnormal or extraneous movements.   GAIT/STANCE: She needs assistance to get up from seated position walks with a wheeled walker. DIAGNOSTIC DATA (LABS, IMAGING, TESTING) - I reviewed patient records, labs, notes, testing and imaging myself where available.  Lab Results  Component Value Date   WBC 11.8* 05/06/2015   HGB 11.7* 05/06/2015   HCT 35.8* 05/06/2015   MCV 91.6 05/06/2015   PLT 382 05/06/2015      Component Value Date/Time   NA 143 05/06/2015 0504   K 3.7 05/06/2015 0504   CL 103 05/06/2015 0504   CO2 30 05/06/2015 0504   GLUCOSE 125* 05/06/2015 0504   BUN 12 05/06/2015 0504   CREATININE 0.62 05/06/2015 0504   CREATININE 0.52 05/17/2013 1144   CALCIUM 9.2 05/06/2015 0504   PROT 7.8 05/03/2015 1500   ALBUMIN 3.6 05/03/2015 1500   AST 18 05/03/2015 1500   ALT 13* 05/03/2015 1500   ALKPHOS 173* 05/03/2015 1500   BILITOT 0.3 05/03/2015 1500   GFRNONAA >60 05/06/2015 0504   GFRAA >60 05/06/2015 0504   Lab Results  Component Value Date   CHOL 169 10/22/2014   HDL 28* 10/22/2014   LDLCALC 105* 10/22/2014   TRIG 178* 10/22/2014   CHOLHDL 6.0 10/22/2014   Lab Results  Component Value Date   HGBA1C 7.3* 05/03/2015   Lab Results  Component Value Date   VITAMINB12 605 10/22/2014   Lab Results  Component Value Date   TSH 1.533 10/22/2014      ASSESSMENT AND PLAN  April [name] is a 75 y.o. 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 I recommend she continue on  aspirin for stroke prevention and maintain strict control of hypertension with blood pressure goal below 130/90 and lipids with LDL cholesterol goal below 70 mg percent and diabetes with hemoglobin A1c goal below 6.5%. Continue Keppra 500 mg twice daily for seizures. She was advised to have regular follow-up with her primary care physician and have lipid profile and hemoglobin A1c checked at next visit. She was also counseled to use a walker at all times and fall prevention .She will return for follow-up in 6 months with Gilford Raid, nurse practitioner or call earlier if necessary   Antony Contras, MD [facility] [name], [place],  [address] Ph: [phone] Fax: [phone]

## 2015-09-21 ENCOUNTER — Telehealth: Payer: Self-pay | Admitting: Neurology

## 2015-09-21 NOTE — Telephone Encounter (Signed)
Pt's niece called inquiring if pt would be a candidate for a handicapp placard? If pt is candidate, pls mail completed form to [facility]  [place]

## 2015-09-21 NOTE — Telephone Encounter (Signed)
Rn stated pt can get form from [facility] and bring it for Dr.Sethi to filled out. PTs niece CIndy verbalized understanding.

## 2015-09-24 DIAGNOSIS — I679 Cerebrovascular disease, unspecified: Secondary | ICD-10-CM | POA: Diagnosis not present

## 2015-09-24 DIAGNOSIS — R569 Unspecified convulsions: Secondary | ICD-10-CM | POA: Diagnosis not present

## 2015-09-24 DIAGNOSIS — R269 Unspecified abnormalities of gait and mobility: Secondary | ICD-10-CM | POA: Diagnosis not present

## 2015-10-05 ENCOUNTER — Telehealth: Payer: Self-pay | Admitting: Adult Health

## 2015-10-05 DIAGNOSIS — R338 Other retention of urine: Secondary | ICD-10-CM | POA: Diagnosis not present

## 2015-10-05 NOTE — Telephone Encounter (Signed)
We can discuss this on Friday at her physical

## 2015-10-05 NOTE — Telephone Encounter (Signed)
Pt does not like the meter Cory gave her, and she would prefer a different meter. The one touch verio is hard for her to use.  Daughter would like to know if she can get a plain one touch. Pt is out of the test strips.  Pt had so much trouble and had errors. Pt has been unable to record her blood sugar daily,  Daughter would like to know if we have any extra strips? Pt has cpe next Friday.  [facility]

## 2015-10-09 NOTE — Telephone Encounter (Signed)
Called and spoke with pt's daughter and she states mother has had a stroke and cannot remember to check her bs.  Pt has difficulty using the lancing device and does not get enough blood to test the bs most of the time.

## 2015-10-12 ENCOUNTER — Telehealth: Payer: Self-pay | Admitting: Adult Health

## 2015-10-12 ENCOUNTER — Ambulatory Visit (INDEPENDENT_AMBULATORY_CARE_PROVIDER_SITE_OTHER): Payer: Medicare HMO | Admitting: Adult Health

## 2015-10-12 ENCOUNTER — Encounter: Payer: Self-pay | Admitting: Adult Health

## 2015-10-12 ENCOUNTER — Other Ambulatory Visit: Payer: Self-pay | Admitting: Adult Health

## 2015-10-12 VITALS — BP 130/70 | HR 86 | Temp 98.4°F | Ht 59.0 in | Wt 122.8 lb

## 2015-10-12 DIAGNOSIS — E1149 Type 2 diabetes mellitus with other diabetic neurological complication: Secondary | ICD-10-CM | POA: Diagnosis not present

## 2015-10-12 DIAGNOSIS — Z0000 Encounter for general adult medical examination without abnormal findings: Secondary | ICD-10-CM

## 2015-10-12 DIAGNOSIS — I10 Essential (primary) hypertension: Secondary | ICD-10-CM

## 2015-10-12 DIAGNOSIS — E785 Hyperlipidemia, unspecified: Secondary | ICD-10-CM | POA: Diagnosis not present

## 2015-10-12 DIAGNOSIS — R69 Illness, unspecified: Secondary | ICD-10-CM | POA: Diagnosis not present

## 2015-10-12 DIAGNOSIS — N390 Urinary tract infection, site not specified: Secondary | ICD-10-CM | POA: Diagnosis not present

## 2015-10-12 LAB — POC URINALSYSI DIPSTICK (AUTOMATED)
BILIRUBIN UA: NEGATIVE
GLUCOSE UA: NEGATIVE
KETONES UA: NEGATIVE
Nitrite, UA: NEGATIVE
Protein, UA: NEGATIVE
SPEC GRAV UA: 1.01
Urobilinogen, UA: 0.2
pH, UA: 7

## 2015-10-12 LAB — CBC WITH DIFFERENTIAL/PLATELET
BASOS ABS: 0 10*3/uL (ref 0.0–0.1)
Basophils Relative: 0.4 % (ref 0.0–3.0)
Eosinophils Absolute: 0.5 10*3/uL (ref 0.0–0.7)
Eosinophils Relative: 4.9 % (ref 0.0–5.0)
HCT: 39.3 % (ref 36.0–46.0)
Hemoglobin: 12.9 g/dL (ref 12.0–15.0)
LYMPHS ABS: 4.7 10*3/uL — AB (ref 0.7–4.0)
Lymphocytes Relative: 43.4 % (ref 12.0–46.0)
MCHC: 32.9 g/dL (ref 30.0–36.0)
MCV: 90.9 fl (ref 78.0–100.0)
MONOS PCT: 7.7 % (ref 3.0–12.0)
Monocytes Absolute: 0.8 10*3/uL (ref 0.1–1.0)
NEUTROS ABS: 4.8 10*3/uL (ref 1.4–7.7)
NEUTROS PCT: 43.6 % (ref 43.0–77.0)
PLATELETS: 376 10*3/uL (ref 150.0–400.0)
RBC: 4.32 Mil/uL (ref 3.87–5.11)
RDW: 13.8 % (ref 11.5–15.5)
WBC: 10.9 10*3/uL — ABNORMAL HIGH (ref 4.0–10.5)

## 2015-10-12 LAB — HEPATIC FUNCTION PANEL
ALBUMIN: 4.2 g/dL (ref 3.5–5.2)
ALT: 12 U/L (ref 0–35)
AST: 16 U/L (ref 0–37)
Alkaline Phosphatase: 74 U/L (ref 39–117)
BILIRUBIN DIRECT: 0.1 mg/dL (ref 0.0–0.3)
TOTAL PROTEIN: 7.9 g/dL (ref 6.0–8.3)
Total Bilirubin: 0.4 mg/dL (ref 0.2–1.2)

## 2015-10-12 LAB — LIPID PANEL
Cholesterol: 140 mg/dL (ref 0–200)
HDL: 41.6 mg/dL (ref >39.00)
LDL Cholesterol: 59 mg/dL (ref 0–99)
NonHDL: 98.58
TRIGLYCERIDES: 199 mg/dL — AB (ref 0.0–149.0)
Total CHOL/HDL Ratio: 3
VLDL: 39.8 mg/dL (ref 0.0–40.0)

## 2015-10-12 LAB — BASIC METABOLIC PANEL
BUN: 21 mg/dL (ref 6–23)
CALCIUM: 9.6 mg/dL (ref 8.4–10.5)
CO2: 34 meq/L — AB (ref 19–32)
CREATININE: 0.87 mg/dL (ref 0.40–1.20)
Chloride: 98 mEq/L (ref 96–112)
GFR: 67.59 mL/min (ref >60.00)
GLUCOSE: 118 mg/dL — AB (ref 70–99)
Potassium: 3.8 mEq/L (ref 3.5–5.1)
SODIUM: 139 meq/L (ref 135–145)

## 2015-10-12 LAB — TSH: TSH: 4.92 u[IU]/mL — ABNORMAL HIGH (ref 0.35–4.50)

## 2015-10-12 LAB — HEMOGLOBIN A1C: Hgb A1c MFr Bld: 6.9 % — ABNORMAL HIGH (ref 4.6–6.5)

## 2015-10-12 LAB — VITAMIN D 25 HYDROXY (VIT D DEFICIENCY, FRACTURES): VITD: 18.73 ng/mL — ABNORMAL LOW (ref 30.00–100.00)

## 2015-10-12 MED ORDER — VITAMIN B-12 1000 MCG SL SUBL: 1000.0000 ug | SUBLINGUAL_TABLET | Freq: Every day | SUBLINGUAL | Status: AC

## 2015-10-12 MED ORDER — ACCU-CHEK SOFT TOUCH LANCETS MISC: Status: DC

## 2015-10-12 MED ORDER — GLIMEPIRIDE 2 MG PO TABS: 2.0000 mg | ORAL_TABLET | Freq: Every day | ORAL | Status: DC

## 2015-10-12 MED ORDER — ATORVASTATIN CALCIUM 20 MG PO TABS: 20.0000 mg | ORAL_TABLET | Freq: Every day | ORAL | Status: DC

## 2015-10-12 MED ORDER — GLUCOSE BLOOD VI STRP: ORAL_STRIP | Status: DC

## 2015-10-12 NOTE — Progress Notes (Signed)
Subjective:    Patient ID: April [name], female    DOB: 07-23-1941, 75 y.o.   MRN: [id number]  HPI  Patient presents for yearly preventative medicine examination. She is a pleasant caucasian female  has a past medical history of Diabetes mellitus; Hypertension; Retinopathy (06/06/2013); Aphasia; Seizures ([place]); Rectum bleeding; HYPERLIPIDEMIA (10/15/2006); GERD (gastroesophageal reflux disease) (05/06/2013); Gram-positive cocci bacteremia; Seizure disorder, status epilepticus, nonconvulsive ([place]); Cerebral infarction due to unspecified mechanism; Chronic diastolic CHF (congestive heart failure), grade I (01/18/2015); Family history of adverse reaction to anesthesia; and Stroke ([place]) (10/21/2014). She presents with her care taker [name].   Medicare questionnaire was completed  All immunizations and health maintenance protocols were reviewed with the patient and needed orders were placed.  Medication reconciliation,  past medical history, social history, problem list and allergies were reviewed in detail with the patient  Goals were established with regard to weight loss, exercise, and  diet in compliance with medications  End of life planning was discussed. She does have a living will and advanced directives.   Tumor markers are up - follows up in May. - Colon Cancer.   She had a mammogram in December 2016 - which was normal.   She is staying active and exercising a few times a week. She is using her walker all the time.   The last time we saw her, we gave her a glucometer. She is having trouble using it and the lancing device. We will give her a different machine.   She continues to live at home by her self. She denies having any recent seizures or stroke like symtpoms. She has been seen by her Neurologist Dr. Leonie Man at the beginning of this month and is going to see her Urologist in March. She no longer has a catheter.   Review of Systems  Constitutional: Negative.   HENT: Negative.    Eyes: Negative.   Respiratory: Negative.   Cardiovascular: Negative.   Gastrointestinal: Negative.   Endocrine: Negative.   Genitourinary: Negative.   Musculoskeletal: Negative.   Skin: Negative.   Allergic/Immunologic: Negative.   Neurological: Negative.   Hematological: Negative.   Psychiatric/Behavioral: Negative.   All other systems reviewed and are negative.  Past Medical History  Diagnosis Date  . Diabetes mellitus   . Hypertension   . Retinopathy 06/06/2013    Per eye exam 05/31/13 eye exam, Moderate retinopathy   . Aphasia   . Seizures ([place])   . Rectum bleeding   . HYPERLIPIDEMIA 10/15/2006    Qualifier: Diagnosis of  By: Beryle Lathe    . GERD (gastroesophageal reflux disease) 05/06/2013  . Gram-positive cocci bacteremia   . Seizure disorder, status epilepticus, nonconvulsive ([place])   . Cerebral infarction due to unspecified mechanism   . Chronic diastolic CHF (congestive heart failure), grade I 01/18/2015    ?   Marland Kitchen Family history of adverse reaction to anesthesia   . Stroke [facility]) 10/21/2014    Social History   Social History  . Marital Status: Widowed    Spouse Name: N/A  . Number of Children: 1  . Years of Education: 13   Occupational History  . Retired    Social History Main Topics  . Smoking status: Never Smoker   . Smokeless tobacco: Never Used  . Alcohol Use: No  . Drug Use: No  . Sexual Activity: Not on file   Other Topics Concern  . Not on file   Social History Narrative   Widowed, husband died in  2003.   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   Right-handed.   1 cup caffeine per day.   She is being cared for by her two neices.       Past Surgical History  Procedure Laterality Date  . Appendectomy  1972  . Abdominal hysterectomy  1972  . Tee without cardioversion N/A  10/24/2014    Procedure: TRANSESOPHAGEAL ECHOCARDIOGRAM (TEE);  Surgeon: Dorothy Spark, MD;  Location: [facility];  Service: Cardiovascular;  Laterality: N/A;  . Cystoscopy w/ ureteral stent placement Left 02/08/2015    Procedure: CYSTOSCOPY WITH RETROGRADE left PYELOGRAM/URETERAL STENT PLACEMENT left insertion of foley;  Surgeon: Franchot Gallo, MD;  Location: [facility];  Service: Urology;  Laterality: Left;  . Colonoscopy with propofol N/A 04/06/2015    Procedure: COLONOSCOPY WITH PROPOFOL;  Surgeon: Carol Ada, MD;  Location: [facility];  Service: Endoscopy;  Laterality: N/A;  . Left leg fracture      broken leg-left in 3 places  . Eye surgery  08/2014    bilateral cataract with lens implants  . Tonsillectomy      as child-age 56  . Laparoscopic partial colectomy N/A 05/03/2015    Procedure: LAPAROSCOPIC PARTIAL COLECTOMY;  Surgeon: [name], MD;  Location: [facility];  Service: General;  Laterality: N/A;    Family History  Problem Relation Age of Onset  . Arthritis Maternal Grandmother   . Heart disease Maternal Grandmother 80  . Diabetes Mother   . Stomach cancer Mother     Allergies  Allergen Reactions  . Codeine Nausea And Vomiting  . Sulfa Antibiotics Nausea And Vomiting    Current Outpatient Prescriptions on File Prior to Visit  Medication Sig Dispense Refill  . aspirin EC 325 MG tablet Take 1 tablet (325 mg total) by mouth daily. 30 tablet 5  . atorvastatin (LIPITOR) 20 MG tablet Take 1 tablet (20 mg total) by mouth daily at 6 PM. 30 tablet 5  . baclofen (LIORESAL) 10 MG tablet Take 10 mg by mouth as needed for muscle spasms.    . bisacodyl (DULCOLAX) 10 MG suppository Place 1 suppository (10 mg total) rectally daily as needed for moderate constipation. 12 suppository 0  . carvedilol (COREG) 3.125 MG tablet Take 1 tablet (3.125 mg total) by mouth 2 (two) times daily with a meal. 180 tablet 0  . furosemide (LASIX) 40 MG tablet Take 1 tablet daily. If you gain > 3lbs  in 1 day or 5 lbs in 1 week, take an extra dose. 180 tablet 0  . glimepiride (AMARYL) 2 MG tablet Take 1 tablet (2 mg total) by mouth daily with breakfast. 90 tablet 0  . glucose blood (ONETOUCH VERIO) test strip Test twice daily. 100 each 5  . isosorbide dinitrate (ISORDIL) 5 MG tablet Take 1 tablet (5 mg total) by mouth 3 (three) times daily. Take 1 tablet by mouth three times a day 90 tablet 5  . levETIRAcetam (KEPPRA) 500 MG tablet Take 1 tablet (500 mg total) by mouth 2 (two) times daily. 180 tablet 0  . metFORMIN (GLUCOPHAGE) 500 MG tablet Take 1 tablet (500 mg total) by mouth 2 (two) times daily with a meal. 60 tablet 5  . omeprazole (PRILOSEC) 20 MG capsule Take 20 mg by mouth daily.    . ondansetron (  ZOFRAN-ODT) 4 MG disintegrating tablet Take 4 mg by mouth every 8 (eight) hours as needed for nausea or vomiting.    Glory Rosebush DELICA LANCETS FINE MISC Test twice daily. 100 each 5  . saccharomyces boulardii (FLORASTOR) 250 MG capsule Take 1 capsule (250 mg total) by mouth 2 (two) times daily. 90 capsule 0  . senna (SENOKOT) 8.6 MG TABS tablet Take 1 tablet (8.6 mg total) by mouth at bedtime. 120 each 1  . traMADol (ULTRAM) 50 MG tablet Take by mouth every 6 (six) hours as needed.     No current facility-administered medications on file prior to visit.    BP 130/70 mmHg  Pulse 86  Temp(Src) 98.4 F (36.9 C) (Oral)  Ht 4\' 11"  (1.499 m)  Wt 122 lb 12.8 oz (55.702 kg)  BMI 24.79 kg/m2  LMP 08/18/1970       Objective:   Physical Exam  Constitutional: She is oriented to person, place, and time. She appears well-developed and well-nourished. No distress.  HENT:  Head: Normocephalic and atraumatic.  Right Ear: External ear normal.  Left Ear: External ear normal.  Nose: Nose normal.  Mouth/Throat: Oropharynx is clear and moist. No oropharyngeal exudate.  Eyes: Conjunctivae are normal. Right eye exhibits no discharge. Left eye exhibits no discharge.  Neck: Normal range of motion.  Neck supple. No tracheal deviation present. No thyromegaly present.  Cardiovascular: Normal rate, regular rhythm, normal heart sounds and intact distal pulses.  Exam reveals no gallop and no friction rub.   No murmur heard. Pulmonary/Chest: Effort normal and breath sounds normal. No respiratory distress. She has no wheezes. She has no rales. She exhibits no tenderness.  Abdominal: Soft. Bowel sounds are normal. She exhibits no distension and no mass. There is no tenderness. There is no rebound and no guarding.  Musculoskeletal: Normal range of motion.  Lymphadenopathy:    She has no cervical adenopathy.  Neurological: She is alert and oriented to person, place, and time. No cranial nerve deficit. Coordination normal.  Skin: Skin is warm and dry. No rash noted. No erythema. No pallor.  Psychiatric: She has a normal mood and affect. Her behavior is normal. Judgment and thought content normal.  Nursing note and vitals reviewed.     Assessment & Plan:  1. Type II diabetes mellitus with neurological manifestations ([place]) -  Lab Results  Component Value Date   HGBA1C 7.3* 05/03/2015   Currently taking 500mg  Metformin BID  - POCT Urinalysis Dipstick (Automated) - Basic metabolic panel - CBC with Differential/Platelet - Hemoglobin A1c - Hepatic function panel - Lipid panel - TSH - EKG 12-Lead- NSR, rate 82 - Consider increasing Metformin. - Follow up in 3 months 2. Hyperlipidemia - Crestor 20 mg currently . - POCT Urinalysis Dipstick (Automated) - Basic metabolic panel - CBC with Differential/Platelet - Hemoglobin A1c - Hepatic function panel - Lipid panel - TSH - EKG 12-Lead  3. Essential hypertension - BP per patient reports are varying between 111-150/70-80.  - POCT Urinalysis Dipstick (Automated) - Basic metabolic panel - CBC with Differential/Platelet - Hemoglobin A1c - Hepatic function panel - Lipid panel - TSH - EKG 12-Lead - she is normal in the office today  130/70. Will consider going up on BP medication but there is concern that she is not correctly using BP cuff. She lives alone and I am afraind that if we go up on her medications it will drop her too much.   4. Routine general medical examination at a health  care facility - POCT Urinalysis Dipstick (Automated) - Basic metabolic panel - CBC with Differential/Platelet - Hemoglobin A1c - Hepatic function panel - Lipid panel - TSH - EKG 12-Lead - Vitamin D, 25-hydroxy - Follow up in one year for next CPE - Follow up sooner if needed - Continue to stay active and eat healthy.

## 2015-10-12 NOTE — Addendum Note (Signed)
Addended by: Gari Crown D on: 10/12/2015 02:30 PM   Modules accepted: Orders

## 2015-10-12 NOTE — Telephone Encounter (Signed)
noted 

## 2015-10-12 NOTE — Addendum Note (Signed)
Addended by: Ailene Rud E on: 10/12/2015 02:42 PM   Modules accepted: Orders

## 2015-10-12 NOTE — Addendum Note (Signed)
Addended by: Ailene Rud E on: 10/12/2015 09:25 AM   Modules accepted: Orders, Medications

## 2015-10-12 NOTE — Patient Instructions (Addendum)
It was great seeing you today!  I will follow up with you regarding your blood work.   I want to keep your medications at the current dose until we talk later today.   Continue to stay active and eat healthy.   Follow up with me in 3 months for diabetes check.  If you need anything, please let me know  Health Maintenance, Female Adopting a healthy lifestyle and getting preventive care can go a long way to promote health and wellness. Talk with your health care provider about what schedule of regular examinations is right for you. This is a good chance for you to check in with your provider about disease prevention and staying healthy. In between checkups, there are plenty of things you can do on your own. Experts have done a lot of research about which lifestyle changes and preventive measures are most likely to keep you healthy. Ask your health care provider for more information. WEIGHT AND DIET  Eat a healthy diet  Be sure to include plenty of vegetables, fruits, low-fat dairy products, and lean protein.  Do not eat a lot of foods high in solid fats, added sugars, or salt.  Get regular exercise. This is one of the most important things you can do for your health.  Most adults should exercise for at least 150 minutes each week. The exercise should increase your heart rate and make you sweat (moderate-intensity exercise).  Most adults should also do strengthening exercises at least twice a week. This is in addition to the moderate-intensity exercise.  Maintain a healthy weight  Body mass index (BMI) is a measurement that can be used to identify possible weight problems. It estimates body fat based on height and weight. Your health care provider can help determine your BMI and help you achieve or maintain a healthy weight.  For females 31 years of age and older:   A BMI below 18.5 is considered underweight.  A BMI of 18.5 to 24.9 is normal.  A BMI of 25 to 29.9 is considered  overweight.  A BMI of 30 and above is considered obese.  Watch levels of cholesterol and blood lipids  You should start having your blood tested for lipids and cholesterol at 75 years of age, then have this test every 5 years.  You may need to have your cholesterol levels checked more often if:  Your lipid or cholesterol levels are high.  You are older than 75 years of age.  You are at high risk for heart disease.  CANCER SCREENING   Lung Cancer  Lung cancer screening is recommended for adults 79-47 years old who are at high risk for lung cancer because of a history of smoking.  A yearly low-dose CT scan of the lungs is recommended for people who:  Currently smoke.  Have quit within the past 15 years.  Have at least a 30-pack-year history of smoking. A pack year is smoking an average of one pack of cigarettes a day for 1 year.  Yearly screening should continue until it has been 15 years since you quit.  Yearly screening should stop if you develop a health problem that would prevent you from having lung cancer treatment.  Breast Cancer  Practice breast self-awareness. This means understanding how your breasts normally appear and feel.  It also means doing regular breast self-exams. Let your health care provider know about any changes, no matter how small.  If you are in your 20s or 30s, you  should have a clinical breast exam (CBE) by a health care provider every 1-3 years as part of a regular health exam.  If you are 31 or older, have a CBE every year. Also consider having a breast X-ray (mammogram) every year.  If you have a family history of breast cancer, talk to your health care provider about genetic screening.  If you are at high risk for breast cancer, talk to your health care provider about having an MRI and a mammogram every year.  Breast cancer gene (BRCA) assessment is recommended for women who have family members with BRCA-related cancers. BRCA-related  cancers include:  Breast.  Ovarian.  Tubal.  Peritoneal cancers.  Results of the assessment will determine the need for genetic counseling and BRCA1 and BRCA2 testing. Cervical Cancer Your health care provider may recommend that you be screened regularly for cancer of the pelvic organs (ovaries, uterus, and vagina). This screening involves a pelvic examination, including checking for microscopic changes to the surface of your cervix (Pap test). You may be encouraged to have this screening done every 3 years, beginning at age 53.  For women ages 8-65, health care providers may recommend pelvic exams and Pap testing every 3 years, or they may recommend the Pap and pelvic exam, combined with testing for human papilloma virus (HPV), every 5 years. Some types of HPV increase your risk of cervical cancer. Testing for HPV may also be done on women of any age with unclear Pap test results.  Other health care providers may not recommend any screening for nonpregnant women who are considered low risk for pelvic cancer and who do not have symptoms. Ask your health care provider if a screening pelvic exam is right for you.  If you have had past treatment for cervical cancer or a condition that could lead to cancer, you need Pap tests and screening for cancer for at least 20 years after your treatment. If Pap tests have been discontinued, your risk factors (such as having a new sexual partner) need to be reassessed to determine if screening should resume. Some women have medical problems that increase the chance of getting cervical cancer. In these cases, your health care provider may recommend more frequent screening and Pap tests. Colorectal Cancer  This type of cancer can be detected and often prevented.  Routine colorectal cancer screening usually begins at 75 years of age and continues through 75 years of age.  Your health care provider may recommend screening at an earlier age if you have risk  factors for colon cancer.  Your health care provider may also recommend using home test kits to check for hidden blood in the stool.  A small camera at the end of a tube can be used to examine your colon directly (sigmoidoscopy or colonoscopy). This is done to check for the earliest forms of colorectal cancer.  Routine screening usually begins at age 11.  Direct examination of the colon should be repeated every 5-10 years through 75 years of age. However, you may need to be screened more often if early forms of precancerous polyps or small growths are found. Skin Cancer  Check your skin from head to toe regularly.  Tell your health care provider about any new moles or changes in moles, especially if there is a change in a mole's shape or color.  Also tell your health care provider if you have a mole that is larger than the size of a pencil eraser.  Always use sunscreen.  Apply sunscreen liberally and repeatedly throughout the day.  Protect yourself by wearing long sleeves, pants, a wide-brimmed hat, and sunglasses whenever you are outside. HEART DISEASE, DIABETES, AND HIGH BLOOD PRESSURE   High blood pressure causes heart disease and increases the risk of stroke. High blood pressure is more likely to develop in:  People who have blood pressure in the high end of the normal range (130-139/85-89 mm Hg).  People who are overweight or obese.  People who are African American.  If you are 96-41 years of age, have your blood pressure checked every 3-5 years. If you are 12 years of age or older, have your blood pressure checked every year. You should have your blood pressure measured twice--once when you are at a hospital or clinic, and once when you are not at a hospital or clinic. Record the average of the two measurements. To check your blood pressure when you are not at a hospital or clinic, you can use:  An automated blood pressure machine at a pharmacy.  A home blood pressure  monitor.  If you are between 1 years and 15 years old, ask your health care provider if you should take aspirin to prevent strokes.  Have regular diabetes screenings. This involves taking a blood sample to check your fasting blood sugar level.  If you are at a normal weight and have a low risk for diabetes, have this test once every three years after 75 years of age.  If you are overweight and have a high risk for diabetes, consider being tested at a younger age or more often. PREVENTING INFECTION  Hepatitis B  If you have a higher risk for hepatitis B, you should be screened for this virus. You are considered at high risk for hepatitis B if:  You were born in a country where hepatitis B is common. Ask your health care provider which countries are considered high risk.  Your parents were born in a high-risk country, and you have not been immunized against hepatitis B (hepatitis B vaccine).  You have HIV or AIDS.  You use needles to inject street drugs.  You live with someone who has hepatitis B.  You have had sex with someone who has hepatitis B.  You get hemodialysis treatment.  You take certain medicines for conditions, including cancer, organ transplantation, and autoimmune conditions. Hepatitis C  Blood testing is recommended for:  Everyone born from [birth year] through 1965.  Anyone with known risk factors for hepatitis C. Sexually transmitted infections (STIs)  You should be screened for sexually transmitted infections (STIs) including gonorrhea and chlamydia if:  You are sexually active and are younger than 75 years of age.  You are older than 75 years of age and your health care provider tells you that you are at risk for this type of infection.  Your sexual activity has changed since you were last screened and you are at an increased risk for chlamydia or gonorrhea. Ask your health care provider if you are at risk.  If you do not have HIV, but are at risk, it may be  recommended that you take a prescription medicine daily to prevent HIV infection. This is called pre-exposure prophylaxis (PrEP). You are considered at risk if:  You are sexually active and do not regularly use condoms or know the HIV status of your partner(s).  You take drugs by injection.  You are sexually active with a partner who has HIV. Talk with your health care provider about  whether you are at high risk of being infected with HIV. If you choose to begin PrEP, you should first be tested for HIV. You should then be tested every 3 months for as long as you are taking PrEP.  PREGNANCY   If you are premenopausal and you may become pregnant, ask your health care provider about preconception counseling.  If you may become pregnant, take 400 to 800 micrograms (mcg) of folic acid every day.  If you want to prevent pregnancy, talk to your health care provider about birth control (contraception). OSTEOPOROSIS AND MENOPAUSE   Osteoporosis is a disease in which the bones lose minerals and strength with aging. This can result in serious bone fractures. Your risk for osteoporosis can be identified using a bone density scan.  If you are 35 years of age or older, or if you are at risk for osteoporosis and fractures, ask your health care provider if you should be screened.  Ask your health care provider whether you should take a calcium or vitamin D supplement to lower your risk for osteoporosis.  Menopause may have certain physical symptoms and risks.  Hormone replacement therapy may reduce some of these symptoms and risks. Talk to your health care provider about whether hormone replacement therapy is right for you.  HOME CARE INSTRUCTIONS   Schedule regular health, dental, and eye exams.  Stay current with your immunizations.   Do not use any tobacco products including cigarettes, chewing tobacco, or electronic cigarettes.  If you are pregnant, do not drink alcohol.  If you are  breastfeeding, limit how much and how often you drink alcohol.  Limit alcohol intake to no more than 1 drink per day for nonpregnant women. One drink equals 12 ounces of beer, 5 ounces of wine, or 1 ounces of hard liquor.  Do not use street drugs.  Do not share needles.  Ask your health care provider for help if you need support or information about quitting drugs.  Tell your health care provider if you often feel depressed.  Tell your health care provider if you have ever been abused or do not feel safe at home.   This information is not intended to replace advice given to you by your health care provider. Make sure you discuss any questions you have with your health care provider.   Document Released: 02/17/2011 Document Revised: 08/25/2014 Document Reviewed: 07/06/2013 Elsevier Interactive Patient Education [employer].

## 2015-10-12 NOTE — Telephone Encounter (Addendum)
[name],April  Called for pt to advise she has spoken with Dr Tomasa Rand office and he does NOT   want to treat the UTI at this point.  So she would like April [name] to know not to call in a prescription.

## 2015-10-12 NOTE — Progress Notes (Signed)
Pre visit review using our clinic review tool, if applicable. No additional management support is needed unless otherwise documented below in the visit note. 

## 2015-10-12 NOTE — Telephone Encounter (Signed)
Patient need refill of Levetiracetam 500 mg 90 day supply. Send to [facility] on [address].

## 2015-10-15 NOTE — Telephone Encounter (Signed)
Lab work faxed to Dr. Diona Fanti.

## 2015-10-16 LAB — URINE CULTURE: Colony Count: >=100000

## 2015-10-17 ENCOUNTER — Telehealth: Payer: Self-pay | Admitting: Adult Health

## 2015-10-17 DIAGNOSIS — E1149 Type 2 diabetes mellitus with other diabetic neurological complication: Secondary | ICD-10-CM

## 2015-10-17 MED ORDER — GLUCOSE BLOOD VI STRP: ORAL_STRIP | Status: AC

## 2015-10-17 NOTE — Telephone Encounter (Signed)
Rx sent and Ms April [name] is aware

## 2015-10-17 NOTE — Telephone Encounter (Signed)
Pharm needs to know how often pt is checking her bs. [facility] [address]

## 2015-10-17 NOTE — Telephone Encounter (Signed)
Ok per [place] for twice a week

## 2015-10-17 NOTE — Telephone Encounter (Signed)
Left message on machine for April [name] to return our call.

## 2015-10-18 DIAGNOSIS — R69 Illness, unspecified: Secondary | ICD-10-CM | POA: Diagnosis not present

## 2015-10-18 MED ORDER — GLUCOSE BLOOD VI STRP: ORAL_STRIP | Status: DC

## 2015-10-18 MED ORDER — ACCU-CHEK SOFT TOUCH LANCETS MISC: Status: DC

## 2015-10-22 DIAGNOSIS — R269 Unspecified abnormalities of gait and mobility: Secondary | ICD-10-CM | POA: Diagnosis not present

## 2015-10-22 DIAGNOSIS — R569 Unspecified convulsions: Secondary | ICD-10-CM | POA: Diagnosis not present

## 2015-10-22 DIAGNOSIS — I679 Cerebrovascular disease, unspecified: Secondary | ICD-10-CM | POA: Diagnosis not present

## 2015-10-22 NOTE — Telephone Encounter (Addendum)
Niece states the wrong lancets were sent to pharmacy. Pt needs the one with a lance drum with 6 needles. Pt has a Accu check fast click machine. (not soft touch) [facility] [address].   Pt opened the box last night and was the wrong thing. She hopes they will take back .

## 2015-10-23 MED ORDER — ACCU-CHEK FASTCLIX LANCETS MISC: Status: AC

## 2015-10-23 NOTE — Telephone Encounter (Signed)
Rx sent to pharmacy   

## 2015-10-23 NOTE — Addendum Note (Signed)
Addended by: Colleen Can on: 10/23/2015 04:56 PM   Modules accepted: Orders, Medications

## 2015-11-08 NOTE — Progress Notes (Signed)
Patient ID: April [name], female   DOB: 07/18/1941, 75 y.o.   MRN: [id number]    HISTORY AND PHYSICAL   DATE: 05/10/15  Location:  [place] of Service: SNF [phone])   Extended Emergency Contact Information Primary Emergency Contact: Barham,Cindy Address: [address]          [place] [address] Johnnette Litter of [place] Phone: [phone] Relation: Niece Secondary Emergency Contact: JDBZMCE,YEM Address: [address]          [place], [place] [address] Johnnette Litter of [place] Mobile Phone: [phone] Relation: Niece  Advanced Directive information    Chief Complaint  Patient presents with  . New Admit To SNF    HPI:  75 yo female seen today as a new admission into SNF following hospital stay for mesenteric mass, hx obstructing renal stone with sepsis, sz d/o, left oopherectomy, DM and hx CVA. CT revealed right mesenteric mass concerning for malignancy/metastatic disease. Colonoscopy revealed terminal ileal mass. bx (+) neuroendocrine tumor. She went to the OR on 9/15th and had right colectomy. Albumin 3.6. A1c 7.3%  She has no concerns. No fatigue, n/V, f/c. Appetite reduced. She has min abdominal pain and was able to wash up this AM on her own. No nursing issues. No falls. Pain controlled   DM - stable on amaryl 2 mg daily and levemir 10 units nightly. A1c 7.3%  Dyslipidemia - stable on lipitor 20 mg daily   Chronic diastolic heart failure - stable on  lasix 40 mg daily with k+ 40 meq daily, isordil 5 mg three times daily and coreg 3.125 mg twice daily   Seizure - no reports of seizure activity present; takes keppra 500 mg twice daily   GERD - stable on prilosec 20 mg daily  Constipation - stable on dulcolax supp daily and prn; senna nightly and florastor daily   Hx CVA - is neurologically stable. Takes  asa 325 mg daily  Hypertension - stable on coreg 3.125 mg twice daily   Past Medical History  Diagnosis Date  .  Diabetes mellitus   . Hypertension   . Retinopathy 06/06/2013    Per eye exam 05/31/13 eye exam, Moderate retinopathy   . Aphasia   . Seizures ([place])   . Rectum bleeding   . HYPERLIPIDEMIA 10/15/2006    Qualifier: Diagnosis of  By: Beryle Lathe    . GERD (gastroesophageal reflux disease) 05/06/2013  . Gram-positive cocci bacteremia   . 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 Cerebral infarction due to unspecified mechanism   . Chronic diastolic CHF (congestive heart failure), grade I 01/18/2015    ?   Marland Kitchen Family history of adverse reaction to anesthesia   . Stroke [facility]) 10/21/2014    Past Surgical History  Procedure Laterality Date  . Appendectomy  1972  . Abdominal hysterectomy  1972  . Tee without cardioversion N/A 10/24/2014    Procedure: TRANSESOPHAGEAL ECHOCARDIOGRAM (TEE);  Surgeon: Dorothy Spark, MD;  Location: [facility];  Service: Cardiovascular;  Laterality: N/A;  . Cystoscopy w/ ureteral stent placement Left 02/08/2015    Procedure: CYSTOSCOPY WITH RETROGRADE left PYELOGRAM/URETERAL STENT PLACEMENT left insertion of foley;  Surgeon: Franchot Gallo, MD;  Location: [facility];  Service: Urology;  Laterality: Left;  . Colonoscopy with propofol N/A 04/06/2015    Procedure: COLONOSCOPY WITH PROPOFOL;  Surgeon: Carol Ada, MD;  Location: [facility];  Service: Endoscopy;  Laterality: N/A;  . Left leg fracture  broken leg-left in 3 places  . Eye surgery  08/2014    bilateral cataract with lens implants  . Tonsillectomy      as child-age 2  . Laparoscopic partial colectomy N/A 05/03/2015    Procedure: LAPAROSCOPIC PARTIAL COLECTOMY;  Surgeon: [name], MD;  Location: [facility];  Service: General;  Laterality: N/A;    Patient Care Team: Dorothyann Peng, NP as PCP - General (Family Medicine)  Social History   Social History  . Marital Status: Widowed    Spouse Name: N/A  . Number of Children: 1  . Years of Education: 13   Occupational  History  . Retired    Social History Main Topics  . Smoking status: Never Smoker   . Smokeless tobacco: Never Used  . Alcohol Use: No  . Drug Use: No  . Sexual Activity: Not on file   Other Topics Concern  . Not on file   Social History Narrative   Widowed, husband died in 2001/11/04.   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   Right-handed.   1 cup caffeine per day.   She is being cared for by her two neices.        reports that she has never smoked. She has never used smokeless tobacco. She reports that she does not drink alcohol or use illicit drugs.  Family History  Problem Relation Age of Onset  . Arthritis Maternal Grandmother   . Heart disease Maternal Grandmother Nov 05, 1978  . Diabetes Mother   . Stomach cancer Mother    Family Status  Relation Status Death Age  . Mother Deceased   . Father Deceased     Immunization History  Administered Date(s) Administered  . Influenza,inj,Quad PF,36+ Mos 05/06/2013, 05/05/2015  . Pneumococcal Polysaccharide-23 05/19/1999, 10/25/2014  . Td 08/19/1995    Allergies  Allergen Reactions  . Codeine Nausea And Vomiting  . Sulfa Antibiotics Nausea And Vomiting    Medications: Patient's Medications  New Prescriptions   ACCU-CHEK FASTCLIX LANCETS MISC    Test once daily.   GLUCOSE BLOOD (ACCU-CHEK SMARTVIEW) TEST STRIP    Test once daily.  Previous Medications   BACLOFEN (LIORESAL) 10 MG TABLET    Take 10 mg by mouth as needed for muscle spasms.   BISACODYL (DULCOLAX) 10 MG SUPPOSITORY    Place 1 suppository (10 mg total) rectally daily as needed for moderate constipation.   OMEPRAZOLE (PRILOSEC) 20 MG CAPSULE    Take 20 mg by mouth daily.   ONDANSETRON (ZOFRAN-ODT) 4 MG DISINTEGRATING TABLET    Take 4 mg by mouth every 8 (eight) hours as needed for nausea or vomiting.    TRAMADOL (ULTRAM) 50 MG TABLET    Take by mouth every 6 (six) hours as needed.  Modified Medications   Modified Medication Previous Medication   ASPIRIN EC 325 MG TABLET aspirin EC 325 MG tablet      Take 1 tablet (325 mg total) by mouth daily.    Take 1 tablet (325 mg total) by mouth daily.   ATORVASTATIN (LIPITOR) 20 MG TABLET atorvastatin (LIPITOR) 20 MG tablet      Take 1 tablet (20 mg total) by mouth daily at 6 PM.    Take 1 tablet (20 mg total) by mouth daily at 6 PM.   CARVEDILOL (  COREG) 3.125 MG TABLET carvedilol (COREG) 3.125 MG tablet      Take 1 tablet (3.125 mg total) by mouth 2 (two) times daily with a meal.    Take 1 tablet (3.125 mg total) by mouth 2 (two) times daily with a meal.   CYANOCOBALAMIN (VITAMIN B-12) 1000 MCG SUBL Cyanocobalamin (VITAMIN B-12) 1000 MCG SUBL      Place 1 tablet (1,000 mcg total) under the tongue daily.    Place under the tongue.   FUROSEMIDE (LASIX) 40 MG TABLET furosemide (LASIX) 40 MG tablet      Take 1 tablet daily. If you gain > 3lbs in 1 day or 5 lbs in 1 week, take an extra dose.    Take 1 tablet daily. If you gain > 3lbs in 1 day or 5 lbs in 1 week, take an extra dose.   GLIMEPIRIDE (AMARYL) 2 MG TABLET glimepiride (AMARYL) 2 MG tablet      Take 1 tablet (2 mg total) by mouth daily with breakfast.    Take 1 tablet (2 mg total) by mouth daily with breakfast.   GLUCOSE BLOOD (ACCU-CHEK SMARTVIEW) TEST STRIP glucose blood (ACCU-CHEK SMARTVIEW) test strip      Use daily    Use as instructed   ISOSORBIDE DINITRATE (ISORDIL) 5 MG TABLET isosorbide dinitrate (ISORDIL) 5 MG tablet      Take 1 tablet (5 mg total) by mouth 3 (three) times daily. Take 1 tablet by mouth three times a day    Take 1 tablet (5 mg total) by mouth 3 (three) times daily. Take 1 tablet by mouth three times a day   LEVETIRACETAM (KEPPRA) 500 MG TABLET levETIRAcetam (KEPPRA) 500 MG tablet      TAKE ONE TABLET BY MOUTH TWICE DAILY    Take 1 tablet (500 mg total) by mouth 2 (two) times  daily.   METFORMIN (GLUCOPHAGE) 500 MG TABLET metFORMIN (GLUCOPHAGE) 500 MG tablet      Take 1 tablet (500 mg total) by mouth 2 (two) times daily with a meal.    Take by mouth 2 (two) times daily with a meal.   SACCHAROMYCES BOULARDII (FLORASTOR) 250 MG CAPSULE saccharomyces boulardii (FLORASTOR) 250 MG capsule      Take 1 capsule (250 mg total) by mouth 2 (two) times daily.    Take 1 capsule (250 mg total) by mouth 2 (two) times daily.   SENNA (SENOKOT) 8.6 MG TABS TABLET senna (SENOKOT) 8.6 MG TABS tablet      Take 1 tablet (8.6 mg total) by mouth at bedtime.    Take 1 tablet by mouth at bedtime.  Discontinued Medications   AMINO ACIDS-PROTEIN HYDROLYS (FEEDING SUPPLEMENT, PRO-STAT SUGAR FREE 64,) LIQD    Take 30 mLs by mouth 2 (two) times daily. Reported on 09/20/2015   ATORVASTATIN (LIPITOR) 20 MG TABLET    Take 1 tablet (20 mg total) by mouth daily at 6 PM.   FUROSEMIDE (LASIX) 40 MG TABLET    Take 1 tablet daily. If you gain > 3lbs in 1 day or 5 lbs in 1 week, take an extra dose.   GLIMEPIRIDE (AMARYL) 2 MG TABLET    Take 1 tablet (2 mg total) by mouth daily with breakfast.   INSULIN DETEMIR (LEVEMIR) 100 UNIT/ML INJECTION    Inject 0.1 mLs (10 Units total) into the skin daily.   LEVETIRACETAM (KEPPRA) 500 MG TABLET    Take 1 tablet (500 mg total) by mouth 2 (two) times daily.  METHOCARBAMOL (ROBAXIN) 500 MG TABLET    Take 1 tablet (500 mg total) by mouth once.   ONDANSETRON (ZOFRAN ODT) 8 MG DISINTEGRATING TABLET    Take 1 tablet (8 mg total) by mouth every 8 (eight) hours as needed for nausea or vomiting.   POTASSIUM CHLORIDE SA (K-DUR,KLOR-CON) 20 MEQ TABLET    Take 2 tablets (40 mEq total) by mouth daily.   TRAMADOL (ULTRAM) 50 MG TABLET    Take 1-2 tablets (50-100 mg total) by mouth every 6 (six) hours as needed for moderate pain or severe pain.    Review of Systems  Constitutional: Positive for appetite change.  Gastrointestinal: Positive for abdominal pain.  All other systems  reviewed and are negative.   Filed Vitals:   05/10/15 1131  BP: 109/73  Pulse: 71  Temp: 97 F (36.1 C)  Weight: 110 lb (49.896 kg)  SpO2: 98%   Body mass index is 22.21 kg/(m^2).  Physical Exam  Constitutional: She is oriented to person, place, and time. She appears well-developed and well-nourished.  Lying in bed resting in NAD  HENT:  Mouth/Throat: Oropharynx is clear and moist. No oropharyngeal exudate.  mmm  Eyes: Pupils are equal, round, and reactive to light. No scleral icterus.  Neck: Neck supple. Carotid bruit is not present. No tracheal deviation present. No thyromegaly present.  Cardiovascular: Normal rate, regular rhythm and intact distal pulses.  Exam reveals no gallop and no friction rub.   Murmur (1/6 sem) heard. No LE edema b/l. no calf TTP.   Pulmonary/Chest: Effort normal and breath sounds normal. No stridor. No respiratory distress. She has no wheezes. She has no rales.  Abdominal: Soft. Bowel sounds are normal. She exhibits no distension and no mass. There is no hepatomegaly. There is tenderness (midepigastric). There is no rebound and no guarding.  Min swelling at midline incision; incision intact, no d/c or redness  Genitourinary:  Foley DTG clear yellow urine  Lymphadenopathy:    She has no cervical adenopathy.  Neurological: She is alert and oriented to person, place, and time. She has normal reflexes.  Skin: Skin is warm and dry. No rash noted.  Psychiatric: She has a normal mood and affect. Her behavior is normal. Judgment and thought content normal.     Labs reviewed: Admission on 05/03/2015, Discharged on 05/07/2015  Component Date Value Ref Range Status  . Glucose-Capillary 05/03/2015 111* 65 - 99 mg/dL Final  . Comment 1 05/03/2015 Notify RN   Final  . ABO/RH(D) 05/03/2015 O POS   Final  . Antibody Screen 05/03/2015 NEG   Final  . Sample Expiration 05/03/2015 05/06/2015   Final  . ABO/RH(D) 05/03/2015 O POS   Final  . Hgb A1c MFr Bld  05/03/2015 7.3* 4.8 - 5.6 % Final   Comment: (NOTE)         Pre-diabetes: 5.7 - 6.4         Diabetes: >6.4         Glycemic control for adults with diabetes: <7.0   . Mean Plasma Glucose 05/03/2015 163   Final   Comment: (NOTE) Performed At: [facility] [place], [place] [id number] Lindon Romp MD WU:[id number]   . Sodium 05/03/2015 141  135 - 145 mmol/L Final  . Potassium 05/03/2015 3.8  3.5 - 5.1 mmol/L Final  . Chloride 05/03/2015 106  101 - 111 mmol/L Final  . CO2 05/03/2015 26  22 - 32 mmol/L Final  . Glucose, Bld 05/03/2015 169* 65 -  99 mg/dL Final  . BUN 05/03/2015 13  6 - 20 mg/dL Final  . Creatinine, Ser 05/03/2015 0.76  0.44 - 1.00 mg/dL Final  . Calcium 05/03/2015 8.9  8.9 - 10.3 mg/dL Final  . Total Protein 05/03/2015 7.8  6.5 - 8.1 g/dL Final  . Albumin 05/03/2015 3.6  3.5 - 5.0 g/dL Final  . AST 05/03/2015 18  15 - 41 U/L Final  . ALT 05/03/2015 13* 14 - 54 U/L Final  . Alkaline Phosphatase 05/03/2015 173* 38 - 126 U/L Final  . Total Bilirubin 05/03/2015 0.3  0.3 - 1.2 mg/dL Final  . GFR calc non Af Amer 05/03/2015 >60  >60 mL/min Final  . GFR calc Af Amer 05/03/2015 >60  >60 mL/min Final   Comment: (NOTE) The eGFR has been calculated using the CKD EPI equation. This calculation has not been validated in all clinical situations. eGFR's persistently <60 mL/min signify possible Chronic Kidney Disease.   . Anion gap 05/03/2015 9  5 - 15 Final  . Glucose-Capillary 05/03/2015 155* 65 - 99 mg/dL Final  . Sodium 05/04/2015 141  135 - 145 mmol/L Final  . Potassium 05/04/2015 4.2  3.5 - 5.1 mmol/L Final  . Chloride 05/04/2015 108  101 - 111 mmol/L Final  . CO2 05/04/2015 24  22 - 32 mmol/L Final  . Glucose, Bld 05/04/2015 256* 65 - 99 mg/dL Final  . BUN 05/04/2015 15  6 - 20 mg/dL Final  . Creatinine, Ser 05/04/2015 0.86  0.44 - 1.00 mg/dL Final  . Calcium 05/04/2015 8.8* 8.9 - 10.3 mg/dL Final  . GFR calc non Af Amer 05/04/2015 >60  >60  mL/min Final  . GFR calc Af Amer 05/04/2015 >60  >60 mL/min Final   Comment: (NOTE) The eGFR has been calculated using the CKD EPI equation. This calculation has not been validated in all clinical situations. eGFR's persistently <60 mL/min signify possible Chronic Kidney Disease.   . Anion gap 05/04/2015 9  5 - 15 Final  . WBC 05/04/2015 13.2* 4.0 - 10.5 K/uL Final  . RBC 05/04/2015 3.75* 3.87 - 5.11 MIL/uL Final  . Hemoglobin 05/04/2015 10.9* 12.0 - 15.0 g/dL Final  . HCT 05/04/2015 34.4* 36.0 - 46.0 % Final  . MCV 05/04/2015 91.7  78.0 - 100.0 fL Final  . MCH 05/04/2015 29.1  26.0 - 34.0 pg Final  . MCHC 05/04/2015 31.7  30.0 - 36.0 g/dL Final  . RDW 05/04/2015 15.0  11.5 - 15.5 % Final  . Platelets 05/04/2015 408* 150 - 400 K/uL Final  . Glucose-Capillary 05/03/2015 209* 65 - 99 mg/dL Final  . Comment 1 05/03/2015 Notify RN   Final  . Comment 2 05/03/2015 Document in Chart   Final  . Glucose-Capillary 05/03/2015 180* 65 - 99 mg/dL Final  . Glucose-Capillary 05/04/2015 211* 65 - 99 mg/dL Final  . Glucose-Capillary 05/04/2015 162* 65 - 99 mg/dL Final  . Sodium 05/05/2015 141  135 - 145 mmol/L Final  . Potassium 05/05/2015 4.2  3.5 - 5.1 mmol/L Final  . Chloride 05/05/2015 108  101 - 111 mmol/L Final  . CO2 05/05/2015 28  22 - 32 mmol/L Final  . Glucose, Bld 05/05/2015 115* 65 - 99 mg/dL Final  . BUN 05/05/2015 11  6 - 20 mg/dL Final  . Creatinine, Ser 05/05/2015 0.63  0.44 - 1.00 mg/dL Final  . Calcium 05/05/2015 8.7* 8.9 - 10.3 mg/dL Final  . GFR calc non Af Amer 05/05/2015 >60  >60 mL/min Final  .  GFR calc Af Amer 05/05/2015 >60  >60 mL/min Final   Comment: (NOTE) The eGFR has been calculated using the CKD EPI equation. This calculation has not been validated in all clinical situations. eGFR's persistently <60 mL/min signify possible Chronic Kidney Disease.   . Anion gap 05/05/2015 5  5 - 15 Final  . WBC 05/05/2015 13.5* 4.0 - 10.5 K/uL Final  . RBC 05/05/2015 3.37* 3.87  - 5.11 MIL/uL Final  . Hemoglobin 05/05/2015 10.0* 12.0 - 15.0 g/dL Final  . HCT 05/05/2015 31.0* 36.0 - 46.0 % Final  . MCV 05/05/2015 92.0  78.0 - 100.0 fL Final  . MCH 05/05/2015 29.7  26.0 - 34.0 pg Final  . MCHC 05/05/2015 32.3  30.0 - 36.0 g/dL Final  . RDW 05/05/2015 14.9  11.5 - 15.5 % Final  . Platelets 05/05/2015 359  150 - 400 K/uL Final  . Glucose-Capillary 05/04/2015 152* 65 - 99 mg/dL Final  . Glucose-Capillary 05/04/2015 129* 65 - 99 mg/dL Final  . Comment 1 05/04/2015 Notify RN   Final  . Glucose-Capillary 05/05/2015 87  65 - 99 mg/dL Final  . Glucose-Capillary 05/05/2015 122* 65 - 99 mg/dL Final  . Sodium 05/06/2015 143  135 - 145 mmol/L Final  . Potassium 05/06/2015 3.7  3.5 - 5.1 mmol/L Final  . Chloride 05/06/2015 103  101 - 111 mmol/L Final  . CO2 05/06/2015 30  22 - 32 mmol/L Final  . Glucose, Bld 05/06/2015 125* 65 - 99 mg/dL Final  . BUN 05/06/2015 12  6 - 20 mg/dL Final  . Creatinine, Ser 05/06/2015 0.62  0.44 - 1.00 mg/dL Final  . Calcium 05/06/2015 9.2  8.9 - 10.3 mg/dL Final  . GFR calc non Af Amer 05/06/2015 >60  >60 mL/min Final  . GFR calc Af Amer 05/06/2015 >60  >60 mL/min Final   Comment: (NOTE) The eGFR has been calculated using the CKD EPI equation. This calculation has not been validated in all clinical situations. eGFR's persistently <60 mL/min signify possible Chronic Kidney Disease.   . Anion gap 05/06/2015 10  5 - 15 Final  . WBC 05/06/2015 11.8* 4.0 - 10.5 K/uL Final  . RBC 05/06/2015 3.91  3.87 - 5.11 MIL/uL Final  . Hemoglobin 05/06/2015 11.7* 12.0 - 15.0 g/dL Final  . HCT 05/06/2015 35.8* 36.0 - 46.0 % Final  . MCV 05/06/2015 91.6  78.0 - 100.0 fL Final  . MCH 05/06/2015 29.9  26.0 - 34.0 pg Final  . MCHC 05/06/2015 32.7  30.0 - 36.0 g/dL Final  . RDW 05/06/2015 14.9  11.5 - 15.5 % Final  . Platelets 05/06/2015 382  150 - 400 K/uL Final  . Glucose-Capillary 05/05/2015 92  65 - 99 mg/dL Final  . Glucose-Capillary 05/05/2015 192* 65 -  99 mg/dL Final  . Glucose-Capillary 05/06/2015 121* 65 - 99 mg/dL Final  . Glucose-Capillary 05/06/2015 166* 65 - 99 mg/dL Final  . Glucose-Capillary 05/06/2015 116* 65 - 99 mg/dL Final  . Glucose-Capillary 05/06/2015 146* 65 - 99 mg/dL Final  . Glucose-Capillary 05/07/2015 129* 65 - 99 mg/dL Final  . Comment 1 05/07/2015 Notify RN   Final  . Comment 2 05/07/2015 Document in Chart   Final  . Glucose-Capillary 05/07/2015 196* 65 - 99 mg/dL Final  . Comment 1 05/07/2015 Notify RN   Final  . Comment 2 05/07/2015 Document in Chart   Final  . Glucose-Capillary 05/07/2015 136* 65 - 99 mg/dL Final  . Comment 1 05/07/2015 Notify RN  Final  . Comment 2 05/07/2015 Document in Chart   Final  Admission on 04/22/2015, Discharged on 04/22/2015  Component Date Value Ref Range Status  . WBC 04/22/2015 11.4* 4.0 - 10.5 K/uL Final  . RBC 04/22/2015 3.79* 3.87 - 5.11 MIL/uL Final  . Hemoglobin 04/22/2015 11.0* 12.0 - 15.0 g/dL Final  . HCT 04/22/2015 34.2* 36.0 - 46.0 % Final  . MCV 04/22/2015 90.2  78.0 - 100.0 fL Final  . MCH 04/22/2015 29.0  26.0 - 34.0 pg Final  . MCHC 04/22/2015 32.2  30.0 - 36.0 g/dL Final  . RDW 04/22/2015 14.9  11.5 - 15.5 % Final  . Platelets 04/22/2015 390  150 - 400 K/uL Final  . Neutrophils Relative % 04/22/2015 37* 43 - 77 % Final  . Neutro Abs 04/22/2015 4.2  1.7 - 7.7 K/uL Final  . Lymphocytes Relative 04/22/2015 46  12 - 46 % Final  . Lymphs Abs 04/22/2015 5.4* 0.7 - 4.0 K/uL Final  . Monocytes Relative 04/22/2015 13* 3 - 12 % Final  . Monocytes Absolute 04/22/2015 1.4* 0.1 - 1.0 K/uL Final  . Eosinophils Relative 04/22/2015 3  0 - 5 % Final  . Eosinophils Absolute 04/22/2015 0.3  0.0 - 0.7 K/uL Final  . Basophils Relative 04/22/2015 1  0 - 1 % Final  . Basophils Absolute 04/22/2015 0.1  0.0 - 0.1 K/uL Final  . Sodium 04/22/2015 138  135 - 145 mmol/L Final  . Potassium 04/22/2015 3.8  3.5 - 5.1 mmol/L Final  . Chloride 04/22/2015 101  101 - 111 mmol/L Final  . BUN  04/22/2015 19  6 - 20 mg/dL Final  . Creatinine, Ser 04/22/2015 0.70  0.44 - 1.00 mg/dL Final  . Glucose, Bld 04/22/2015 78  65 - 99 mg/dL Final  . Calcium, Ion 04/22/2015 1.04* 1.13 - 1.30 mmol/L Final  . TCO2 04/22/2015 28  0 - 100 mmol/L Final  . Hemoglobin 04/22/2015 11.6* 12.0 - 15.0 g/dL Final  . HCT 04/22/2015 34.0* 36.0 - 46.0 % Final  . Color, Urine 04/22/2015 YELLOW  YELLOW Final  . APPearance 04/22/2015 CLEAR  CLEAR Final  . Specific Gravity, Urine 04/22/2015 1.006  1.005 - 1.030 Final  . pH 04/22/2015 7.0  5.0 - 8.0 Final  . Glucose, UA 04/22/2015 NEGATIVE  NEGATIVE mg/dL Final  . Hgb urine dipstick 04/22/2015 MODERATE* NEGATIVE Final  . Bilirubin Urine 04/22/2015 NEGATIVE  NEGATIVE Final  . Ketones, ur 04/22/2015 NEGATIVE  NEGATIVE mg/dL Final  . Protein, ur 04/22/2015 NEGATIVE  NEGATIVE mg/dL Final  . Urobilinogen, UA 04/22/2015 0.2  0.0 - 1.0 mg/dL Final  . Nitrite 04/22/2015 POSITIVE* NEGATIVE Final  . Leukocytes, UA 04/22/2015 MODERATE* NEGATIVE Final  . WBC, UA 04/22/2015 11-20  <3 WBC/hpf Final   WITH CLUMPS  . RBC / HPF 04/22/2015 3-6  <3 RBC/hpf Final  . Bacteria, UA 04/22/2015 FEW* RARE Final  Admission on 04/06/2015, Discharged on 04/06/2015  Component Date Value Ref Range Status  . Glucose-Capillary 04/06/2015 114* 65 - 99 mg/dL Final  Admission on 02/07/2015, Discharged on 02/13/2015  No results displayed because visit has over 200 results.      No results found.   Assessment/Plan   ICD-9-CM ICD-10-CM   1. 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 Seizure disorder ([place]) 345.90 G40.909   3. Uncontrolled type 2 diabetes mellitus with insulin therapy ([place]) 250.02 E11.65    V58.67    4. History of stroke V12.54 Z86.73   5. Hyperlipidemia 272.4  E78.5   6. HYPERTENSION, BENIGN SYSTEMIC 401.1 I10   7. Chronic diastolic CHF (congestive heart failure), grade I 428.32 I50.32    428.0    8. Physical deconditioning 799.3 R53.81   9. Protein-calorie  malnutrition, severe (HCC) - albumin stable 262 E43     Cont current meds as ordered  PT/OT/ST as ordered  Cont nutritional supplements  F/u with specialists as scheduled  address code status  GOAL: short term rehab and d/c back to ALF when medically appropriate. Communicated with pt and nursing.  Will follow  Rodnesha Elie S. Perlie Gold  [facility] [address] [place], [place] [address] [phone] Cell (Monday-Friday 8 AM - 5 PM) [phone] After 5 PM and follow prompts

## 2015-11-12 ENCOUNTER — Other Ambulatory Visit: Payer: Self-pay | Admitting: Adult Health

## 2015-11-16 DIAGNOSIS — R338 Other retention of urine: Secondary | ICD-10-CM | POA: Diagnosis not present

## 2015-11-22 DIAGNOSIS — I679 Cerebrovascular disease, unspecified: Secondary | ICD-10-CM | POA: Diagnosis not present

## 2015-11-22 DIAGNOSIS — R269 Unspecified abnormalities of gait and mobility: Secondary | ICD-10-CM | POA: Diagnosis not present

## 2015-11-22 DIAGNOSIS — R569 Unspecified convulsions: Secondary | ICD-10-CM | POA: Diagnosis not present

## 2015-11-23 DIAGNOSIS — R338 Other retention of urine: Secondary | ICD-10-CM | POA: Diagnosis not present

## 2015-12-10 ENCOUNTER — Other Ambulatory Visit: Payer: Self-pay | Admitting: *Deleted

## 2015-12-10 MED ORDER — LEVETIRACETAM 500 MG PO TABS: 500.0000 mg | ORAL_TABLET | Freq: Two times a day (BID) | ORAL | Status: DC

## 2015-12-11 DIAGNOSIS — C7A8 Other malignant neuroendocrine tumors: Secondary | ICD-10-CM | POA: Diagnosis not present

## 2015-12-11 DIAGNOSIS — C7A019 Malignant carcinoid tumor of the small intestine, unspecified portion: Secondary | ICD-10-CM | POA: Diagnosis not present

## 2015-12-21 DIAGNOSIS — Z961 Presence of intraocular lens: Secondary | ICD-10-CM | POA: Diagnosis not present

## 2015-12-21 DIAGNOSIS — H40013 Open angle with borderline findings, low risk, bilateral: Secondary | ICD-10-CM | POA: Diagnosis not present

## 2015-12-21 DIAGNOSIS — H21232 Degeneration of iris (pigmentary), left eye: Secondary | ICD-10-CM | POA: Diagnosis not present

## 2015-12-21 DIAGNOSIS — H04123 Dry eye syndrome of bilateral lacrimal glands: Secondary | ICD-10-CM | POA: Diagnosis not present

## 2015-12-22 DIAGNOSIS — I679 Cerebrovascular disease, unspecified: Secondary | ICD-10-CM | POA: Diagnosis not present

## 2015-12-22 DIAGNOSIS — R569 Unspecified convulsions: Secondary | ICD-10-CM | POA: Diagnosis not present

## 2015-12-22 DIAGNOSIS — R269 Unspecified abnormalities of gait and mobility: Secondary | ICD-10-CM | POA: Diagnosis not present

## 2016-01-22 DIAGNOSIS — R269 Unspecified abnormalities of gait and mobility: Secondary | ICD-10-CM | POA: Diagnosis not present

## 2016-01-22 DIAGNOSIS — R569 Unspecified convulsions: Secondary | ICD-10-CM | POA: Diagnosis not present

## 2016-01-22 DIAGNOSIS — I679 Cerebrovascular disease, unspecified: Secondary | ICD-10-CM | POA: Diagnosis not present

## 2016-02-02 ENCOUNTER — Other Ambulatory Visit: Payer: Self-pay | Admitting: Adult Health

## 2016-02-04 NOTE — Telephone Encounter (Signed)
Refills sent to pharmacy. 

## 2016-02-04 NOTE — Telephone Encounter (Signed)
Pt need new Rx for metformin 90 day supply, levetiracetam, senna and isosorbide.  Pharm:  [facility] on [address]

## 2016-02-08 DIAGNOSIS — R3912 Poor urinary stream: Secondary | ICD-10-CM | POA: Diagnosis not present

## 2016-02-15 ENCOUNTER — Ambulatory Visit (INDEPENDENT_AMBULATORY_CARE_PROVIDER_SITE_OTHER): Payer: Medicare HMO | Admitting: Adult Health

## 2016-02-15 ENCOUNTER — Telehealth: Payer: Self-pay | Admitting: Adult Health

## 2016-02-15 ENCOUNTER — Encounter: Payer: Self-pay | Admitting: Adult Health

## 2016-02-15 VITALS — BP 116/72 | HR 85 | Temp 98.1°F | Ht 59.0 in | Wt 115.4 lb

## 2016-02-15 DIAGNOSIS — N939 Abnormal uterine and vaginal bleeding, unspecified: Secondary | ICD-10-CM

## 2016-02-15 LAB — POCT URINALYSIS DIPSTICK
BILIRUBIN UA: NEGATIVE
GLUCOSE UA: NEGATIVE
KETONES UA: NEGATIVE
Nitrite, UA: NEGATIVE
PH UA: 5.5
Protein, UA: NEGATIVE
Spec Grav, UA: 1.01
Urobilinogen, UA: 0.2

## 2016-02-15 LAB — CBC WITH DIFFERENTIAL/PLATELET
BASOS ABS: 0.1 10*3/uL (ref 0.0–0.1)
BASOS PCT: 0.5 % (ref 0.0–3.0)
EOS ABS: 0.3 10*3/uL (ref 0.0–0.7)
Eosinophils Relative: 3.3 % (ref 0.0–5.0)
HCT: 33.6 % — ABNORMAL LOW (ref 36.0–46.0)
Hemoglobin: 11.1 g/dL — ABNORMAL LOW (ref 12.0–15.0)
LYMPHS ABS: 4.5 10*3/uL — AB (ref 0.7–4.0)
Lymphocytes Relative: 45.3 % (ref 12.0–46.0)
MCHC: 32.9 g/dL (ref 30.0–36.0)
MCV: 87.2 fl (ref 78.0–100.0)
MONOS PCT: 9 % (ref 3.0–12.0)
Monocytes Absolute: 0.9 10*3/uL (ref 0.1–1.0)
NEUTROS ABS: 4.2 10*3/uL (ref 1.4–7.7)
NEUTROS PCT: 41.9 % — AB (ref 43.0–77.0)
PLATELETS: 547 10*3/uL — AB (ref 150.0–400.0)
RBC: 3.86 Mil/uL — ABNORMAL LOW (ref 3.87–5.11)
RDW: 14.5 % (ref 11.5–15.5)
WBC: 10 10*3/uL (ref 4.0–10.5)

## 2016-02-15 IMAGING — CT CT ABD-PEL WO/W CM
3 of 9 series · 12 of 36 positions shown, 17 images · IV contrast (READICAT/WATER & [ID] ISOVUE 300)
Comparison: 04/22/2015 CT abdomen/pelvis.

CLINICAL DATA: Invasive neuroendocrine tumor of the terminal ileum
involving the ileocecal valve with mesenteric nodal metastases
status post right colectomy and terminal ileal resection on
05/03/2015, evaluate for liver metastases.

EXAM:
CT ABDOMEN AND PELVIS WITHOUT AND WITH CONTRAST
TECHNIQUE: Multidetector CT imaging of the abdomen and pelvis was performed
following the standard protocol before and following the bolus
administration of intravenous contrast.
CONTRAST:  100mL [id number] IOPAMIDOL ([id number]) INJECTION 61%

[Series 5: arterial (id) · axial · arterial · 0.74mm/px · z∈[-122,+3]mm · 3 of 100 slices shown]
[im 25/100  soft-tissue]
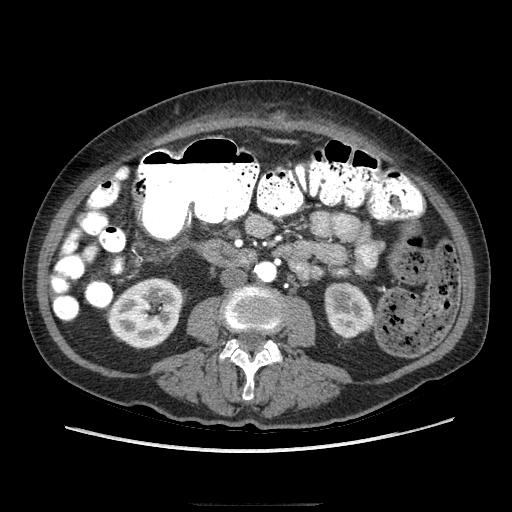
[im 50/100  soft-tissue]
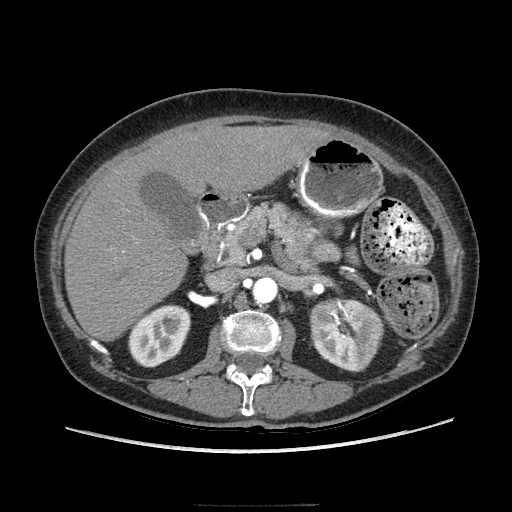
[im 75/100  soft-tissue]
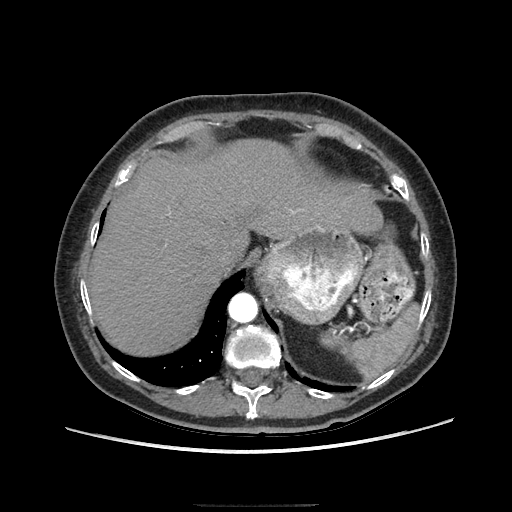

[Series 8: portal venous (id) · axial · portal-venous · 0.74mm/px · z∈[-300,+3]mm · 6 of 171 slices shown, 11 images]
[im 25/171  soft-tissue]
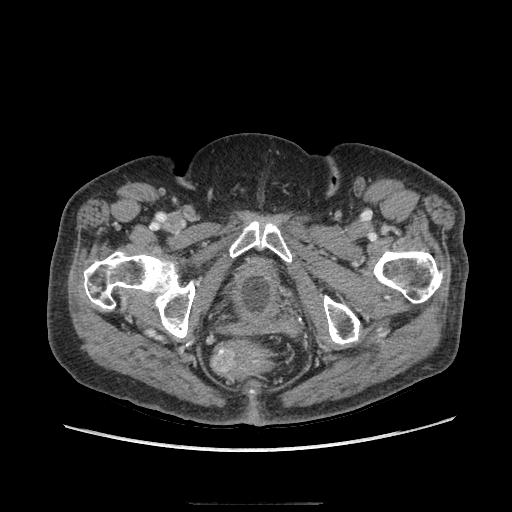
[im 25/171  bone]
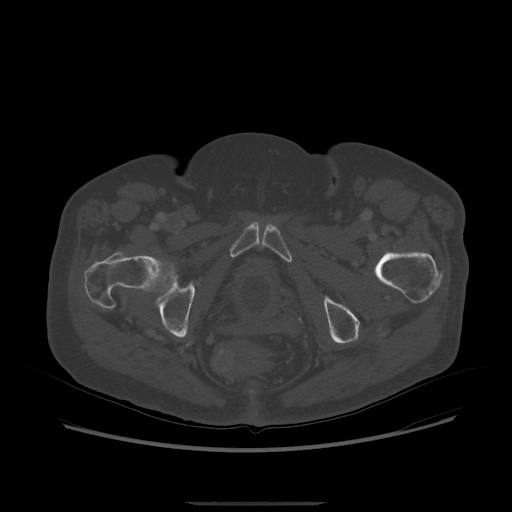
[im 49/171  soft-tissue]
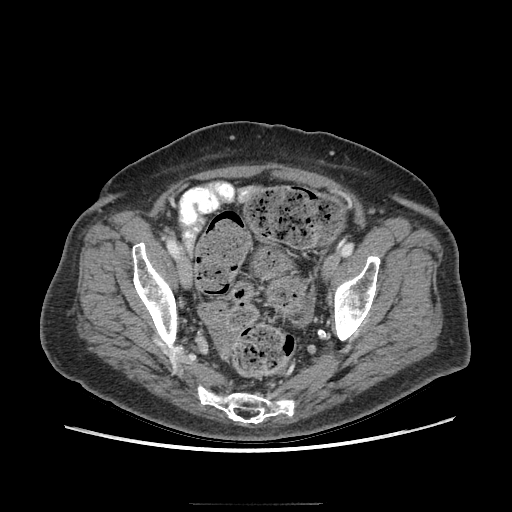
[im 73/171  soft-tissue]
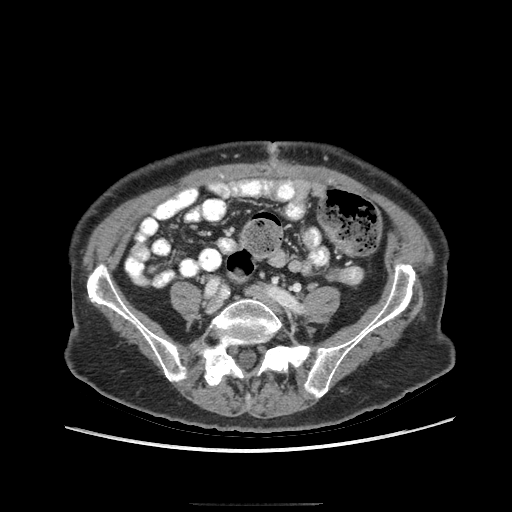
[im 73/171  lung]
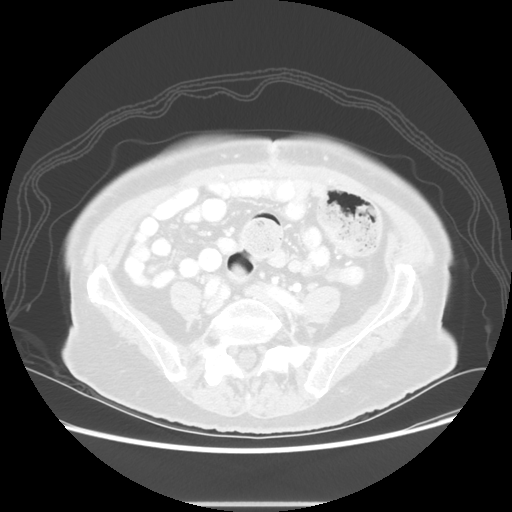
[im 98/171  soft-tissue]
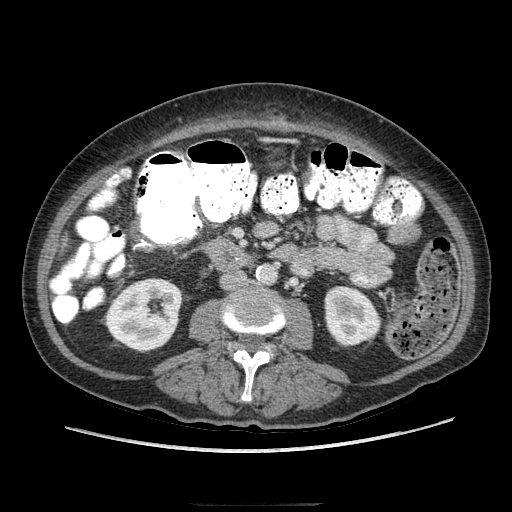
[im 98/171  lung]
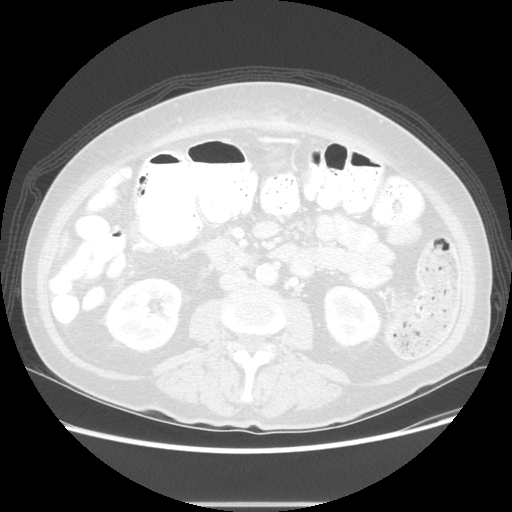
[im 122/171  soft-tissue]
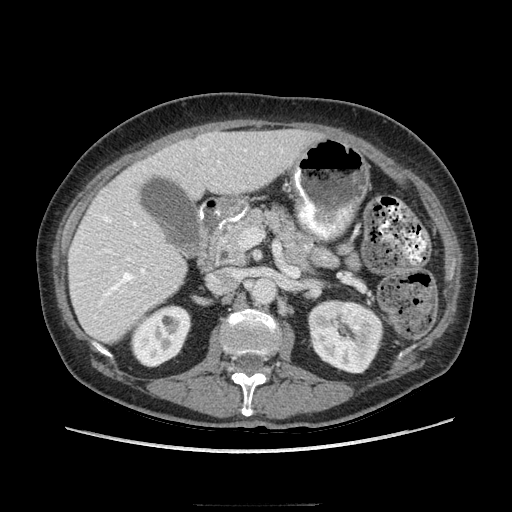
[im 122/171  lung]
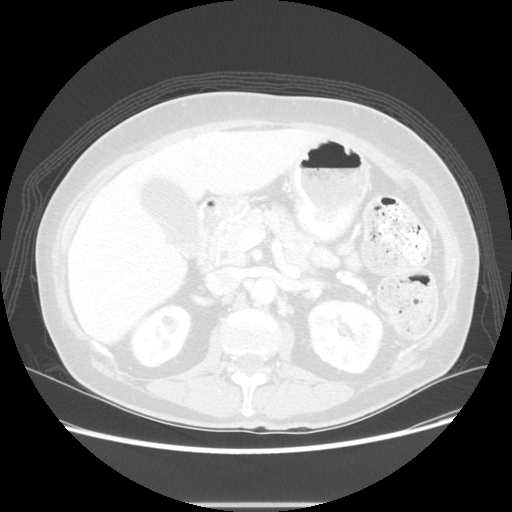
[im 146/171  soft-tissue]
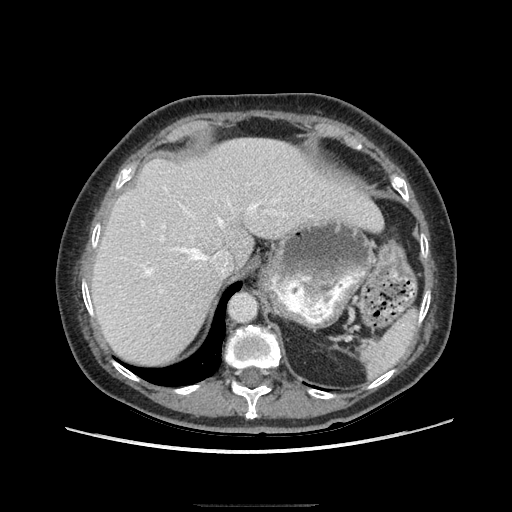
[im 146/171  lung]
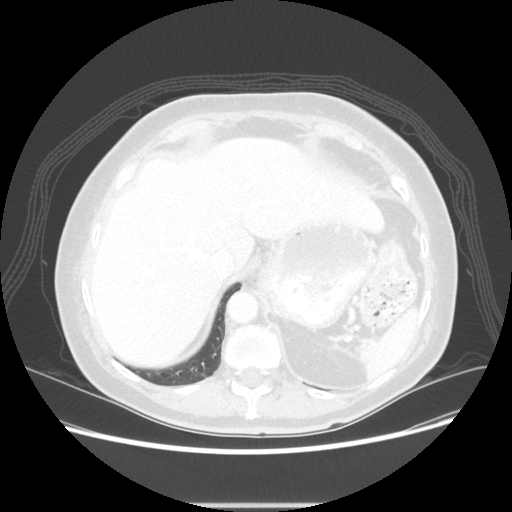

[Series 602: sagittal body · sagittal · 0.74mm/px · 3 of 141 slices shown]
[im 24/141  soft-tissue]
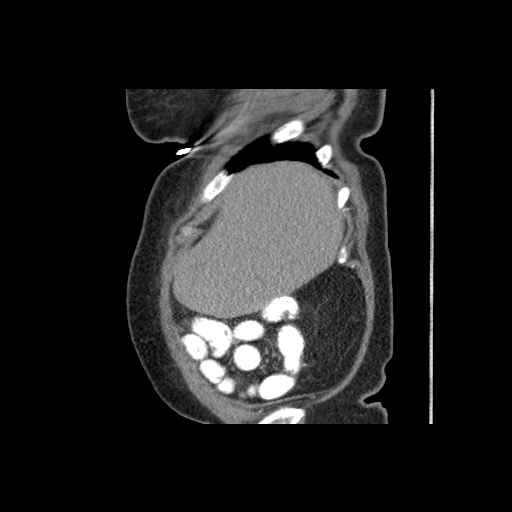
[im 47/141  soft-tissue]
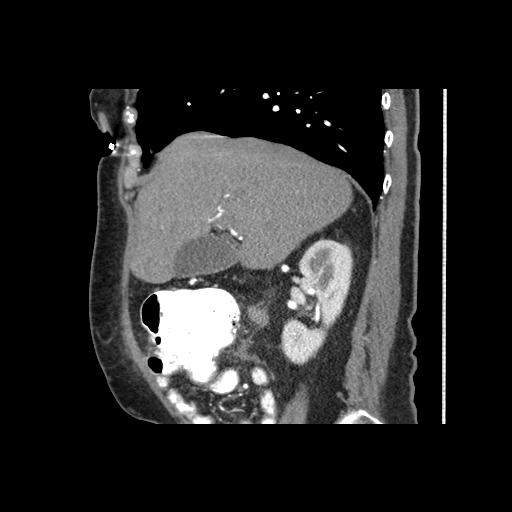
[im 71/141  soft-tissue]
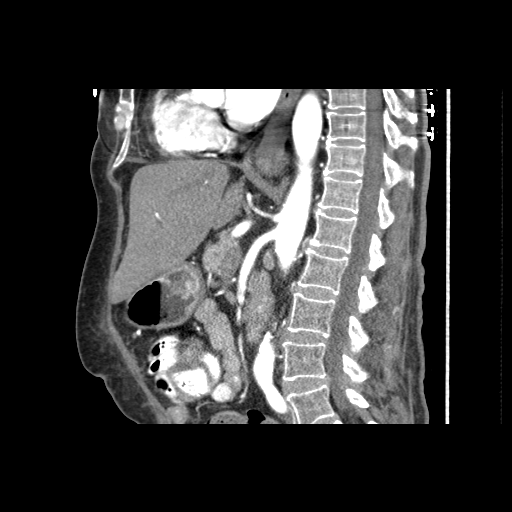

[12 of 36 positions shown; findings below may reference images not displayed]

FINDINGS: Lower chest: No significant pulmonary nodules or acute consolidative
airspace disease. Right coronary artery calcifications are present.

Hepatobiliary: Normal liver with no liver mass. There is a transient
subcapsular focus of hypoenhancement in the anterior medial segment
left liver lobe adjacent to the intersegmental fissure (series 8/
image 45), which is occult on all other sequences and benign. The
gallbladder contains layering subcentimeter calcified stones and
dense sludge. No gallbladder wall thickening or pericholecystic fat
stranding. No biliary ductal dilatation.

Pancreas: Normal, with no mass or duct dilation.

Spleen: Normal size spleen with no splenic mass. Stable scattered
punctate granulomatous splenic calcifications.

Adrenals/Urinary Tract: Normal adrenals. Nonobstructing 3 mm
interpolar right renal stone. No left renal stones. No
hydronephrosis. There are subcentimeter hypodense lesions in the
upper kidneys bilaterally, too small to characterize, stable since
02/07/2015, likely benign renal cysts. No new renal masses. There
patchy segmental regions of relative hypoenhancement throughout the
left renal cortex (for example series 5/image 49), a nonspecific
finding that could indicate acute pyelonephritis. The urinary
bladder is relatively collapsed by an indwelling Foley catheter,
with the suggestion of diffuse bladder wall thickening.

Stomach/Bowel: Grossly normal stomach. There are interval
postsurgical changes from subtotal right hemicolectomy and terminal
ileal resection, with neo ileocolic anastomosis in the right upper
abdomen, which appears widely patent with no evidence of
extraluminal contrast leak and no wall thickening to suggest local
tumor recurrence. Oral contrast progresses into the descending
colon. No wall thickening in the residual small or large bowel.
Scratch

Vascular/Lymphatic: Atherosclerotic nonaneurysmal abdominal aorta.
Patent portal, splenic, hepatic and renal veins. No pathologically
enlarged lymph nodes in the abdomen or pelvis.

Reproductive: Status posthysterectomy, with no abnormal findings of
the vaginal cuff. No adnexal mass.

Other: No pneumoperitoneum, ascites or focal fluid collection.

Musculoskeletal: There is new curvilinear patchy sclerosis
throughout the bilateral sacrum without discrete osseous mass. Mild
degenerative changes in the visualized thoracolumbar spine.
IMPRESSION: 1. No evidence of local tumor recurrence at the neo-ileocolic
anastomosis in the right abdomen.
2. No evidence of metastatic disease in the abdomen or pelvis.
3. New patchy segmental regions of hypoenhancement throughout the
left renal cortex. Suggestion of diffuse bladder wall thickening.
While nonspecific, these findings are worrisome for acute urinary
tract infection with acute left pyelonephritis. No hydronephrosis.
No evidence of a renal abscess. Nonobstructing 3 mm right renal
stone.
4. New nonspecific patchy curvilinear sclerosis throughout the
bilateral sacrum, without discrete osseous mass. This finding is
worrisome for new sacral insufficiency fractures. Consider further
evaluation with bone protocol MRI of the sacrum/pelvis.
5. Cholelithiasis and sludge in the gallbladder, with no evidence of
acute cholecystitis.
These results will be called to the ordering clinician or
representative by the Radiologist Assistant, and communication
documented in the PACS or zVision Dashboard.

## 2016-02-15 NOTE — Progress Notes (Signed)
Pre visit review using our clinic review tool, if applicable. No additional management support is needed unless otherwise documented below in the visit note. 

## 2016-02-15 NOTE — Patient Instructions (Signed)
It was great seeing you again   I am going to refer you to GYN for further evaluation.   Please stop by the lab and give a urine sample.

## 2016-02-15 NOTE — Progress Notes (Signed)
Subjective:    Patient ID: April [name], female    DOB: 1941/06/10, 75 y.o.   MRN: [id number]  HPI  75 year old female who presents to the office today for post menopausal vaginal bleeding for the last 3-4 days. The bleeding started off mostly at night but as the days have progressed she has noticed bleeding throughout the day as well. She also reports having to change her pads 1-2 times per day.   The blood is described as " sticky" and is dark red. She denies any rectal pain, rectal bleeding, or blood in stool. She does have a history of colon cancer  She denies any acute hematuria. Within the last two days she was started on Flomax by her Urologist so she has been having to urinate more often   Review of Systems  Constitutional: Negative.   HENT: Negative.   Eyes: Negative.   Respiratory: Negative.   Cardiovascular: Negative.   Gastrointestinal: Negative.   Endocrine: Negative.   Genitourinary: Positive for vaginal bleeding and pelvic pain. Negative for hematuria, vaginal discharge, difficulty urinating and vaginal pain.  Musculoskeletal: Negative.   Skin: Negative.   Allergic/Immunologic: Negative.   Neurological: Negative.   Psychiatric/Behavioral: Negative.   All other systems reviewed and are negative.  Past Medical History  Diagnosis Date  . Diabetes mellitus   . Hypertension   . Retinopathy 06/06/2013    Per eye exam 05/31/13 eye exam, Moderate retinopathy   . Aphasia   . Seizures ([place])   . Rectum bleeding   . HYPERLIPIDEMIA 10/15/2006    Qualifier: Diagnosis of  By: Beryle Lathe    . GERD (gastroesophageal reflux disease) 05/06/2013  . Gram-positive cocci bacteremia   . 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 Cerebral infarction due to unspecified mechanism   . Chronic diastolic CHF (congestive heart failure), grade I 01/18/2015    ?   Marland Kitchen Family history of adverse reaction to anesthesia   . Stroke [facility]) 10/21/2014    Social  History   Social History  . Marital Status: Widowed    Spouse Name: N/A  . Number of Children: 1  . Years of Education: 13   Occupational History  . Retired    Social History Main Topics  . Smoking status: Never Smoker   . Smokeless tobacco: Never Used  . Alcohol Use: No  . Drug Use: No  . Sexual Activity: Not on file   Other Topics Concern  . Not on file   Social History Narrative   Widowed, husband died in 2001/11/26.   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   Right-handed.   1 cup caffeine per day.   She is being cared for by her two neices.       Past Surgical History  Procedure Laterality Date  . Appendectomy  1970-11-27  . Abdominal hysterectomy  Nov 27, 1970  . Tee without cardioversion N/A 10/24/2014    Procedure: TRANSESOPHAGEAL ECHOCARDIOGRAM (TEE);  Surgeon: Dorothy Spark, MD;  Location: [place];  Service: Cardiovascular;  Laterality: N/A;  . Cystoscopy w/ ureteral stent placement Left 02/08/2015    Procedure: CYSTOSCOPY WITH RETROGRADE left PYELOGRAM/URETERAL STENT PLACEMENT left insertion of foley;  Surgeon: Franchot Gallo, MD;  Location: [facility];  Service: Urology;  Laterality: Left;  .  Colonoscopy with propofol N/A 04/06/2015    Procedure: COLONOSCOPY WITH PROPOFOL;  Surgeon: Carol Ada, MD;  Location: [facility];  Service: Endoscopy;  Laterality: N/A;  . Left leg fracture      broken leg-left in 3 places  . Eye surgery  08/2014    bilateral cataract with lens implants  . Tonsillectomy      as child-age 55  . Laparoscopic partial colectomy N/A 05/03/2015    Procedure: LAPAROSCOPIC PARTIAL COLECTOMY;  Surgeon: [name], MD;  Location: [facility];  Service: General;  Laterality: N/A;    Family History  Problem Relation Age of Onset  . Arthritis Maternal Grandmother   . Heart disease Maternal  Grandmother 80  . Diabetes Mother   . Stomach cancer Mother     Allergies  Allergen Reactions  . Codeine Nausea And Vomiting  . Sulfa Antibiotics Nausea And Vomiting    Current Outpatient Prescriptions on File Prior to Visit  Medication Sig Dispense Refill  . ACCU-CHEK FASTCLIX LANCETS MISC Test once daily. 102 each 5  . aspirin EC 325 MG tablet Take 1 tablet (325 mg total) by mouth daily. 30 tablet 5  . atorvastatin (LIPITOR) 20 MG tablet Take 1 tablet (20 mg total) by mouth daily at 6 PM. 90 tablet 1  . bisacodyl (DULCOLAX) 10 MG suppository Place 1 suppository (10 mg total) rectally daily as needed for moderate constipation. 12 suppository 0  . carvedilol (COREG) 3.125 MG tablet TAKE ONE TABLET BY MOUTH TWICE DAILY WITH A MEAL 180 tablet 2  . Cyanocobalamin (VITAMIN B-12) 1000 MCG SUBL Place 1 tablet (1,000 mcg total) under the tongue daily. 90 tablet 1  . furosemide (LASIX) 40 MG tablet TAKE 1 TABLET BY MOUTH DAILY. IF YOU GAIN MORE THAN 3 POUNDS IN 1 DAY OR 5 POUNDS IN 1 WEEK TAKE AN EXTRA DOSE 180 tablet 2  . glimepiride (AMARYL) 2 MG tablet Take 1 tablet (2 mg total) by mouth daily with breakfast. 90 tablet 1  . glucose blood (ACCU-CHEK SMARTVIEW) test strip Use daily 100 each 3  . glucose blood (ACCU-CHEK SMARTVIEW) test strip Test once daily. 100 each 3  . isosorbide dinitrate (ISORDIL) 5 MG tablet TAKE ONE TABLET BY MOUTH THREE TIMES DAILY 90 tablet 2  . levETIRAcetam (KEPPRA) 500 MG tablet Take 1 tablet (500 mg total) by mouth 2 (two) times daily. 180 tablet 0  . metFORMIN (GLUCOPHAGE) 500 MG tablet TAKE ONE TABLET BY MOUTH TWICE DAILY WITH A MEAL 180 tablet 2  . omeprazole (PRILOSEC) 20 MG capsule Take 20 mg by mouth daily.    Marland Kitchen saccharomyces boulardii (FLORASTOR) 250 MG capsule Take 1 capsule (250 mg total) by mouth 2 (two) times daily. 90 capsule 0  . senna (SENOKOT) 8.6 MG TABS tablet Take 1 tablet (8.6 mg total) by mouth at bedtime. 120 each 1  . baclofen (LIORESAL) 10 MG  tablet Take 10 mg by mouth as needed for muscle spasms. Reported on 02/15/2016    . ondansetron (ZOFRAN-ODT) 4 MG disintegrating tablet Take 4 mg by mouth every 8 (eight) hours as needed for nausea or vomiting. Reported on 02/15/2016    . traMADol (ULTRAM) 50 MG tablet Take by mouth every 6 (six) hours as needed. Reported on 02/15/2016     No current facility-administered medications on file prior to visit.    BP 116/72 mmHg  Pulse 85  Temp(Src) 98.1 F (36.7 C) (Oral)  Ht 4\' 11"  (1.499 m)  Wt 115 lb 6.4 oz (  52.345 kg)  BMI 23.30 kg/m2  SpO2 97%  LMP 08/18/1970       Objective:   Physical Exam  Constitutional: She is oriented to person, place, and time. She appears well-developed and well-nourished. No distress.  Cardiovascular: Normal rate, regular rhythm, normal heart sounds and intact distal pulses.  Exam reveals no gallop and no friction rub.   No murmur heard. Pulmonary/Chest: Effort normal and breath sounds normal. No respiratory distress. She has no wheezes. She has no rales. She exhibits no tenderness.  Genitourinary: Rectum normal. Rectal exam shows no tenderness and anal tone normal. Guaiac negative stool. Right adnexum displays no mass, no tenderness and no fullness. Left adnexum displays no mass, no tenderness and no fullness. No erythema, tenderness or bleeding in the vagina. No foreign body around the vagina. No signs of injury around the vagina. No vaginal discharge found.  Slightly red cervix. No acute bleeding noted. No fistula felt, ovaries not palpated   Musculoskeletal: Normal range of motion. She exhibits no edema or tenderness.  Neurological: She is alert and oriented to person, place, and time. She has normal reflexes.  Skin: Skin is warm and dry. No rash noted. She is not diaphoretic. No erythema. No pallor.  Psychiatric: She has a normal mood and affect. Her behavior is normal. Judgment and thought content normal.  Nursing note and vitals reviewed.       Assessment & Plan:  1. Vaginal bleeding - No acute bleeding noticed. Due to symptoms I will refer her to GYN for further evaluation.  - POC Urinalysis Dipstick- urine with +1 blood which is normal for her.  - CBC with Differential/Platelet - Ambulatory referral to Gynecologic Oncology - Follow up as needed or if symptoms worsen

## 2016-02-15 NOTE — Telephone Encounter (Signed)
FYI

## 2016-02-15 NOTE — Telephone Encounter (Signed)
Patient Name: April [name]  DOB: Nov 25, 1940    Initial Comment Caller states her aunt is having post menopause vaginal bleeding,   Nurse Assessment  Nurse: Raphael Gibney, RN, Vanita Ingles Date/Time (Eastern Time): 02/15/2016 9:41:21 AM  Confirm and document reason for call. If symptomatic, describe symptoms. You must click the next button to save text entered. ---Caller states she is having vaginal bleeding. Has had vaginal bleeding for 3-4 days. Bleeding is intermittent. Changing pads 1-2 times a day. Had hysterectomy several years ago.  Has the patient traveled out of the country within the last 30 days? ---Not Applicable  Does the patient have any new or worsening symptoms? ---Yes  Will a triage be completed? ---Yes  Related visit to physician within the last 2 weeks? ---No  Does the PT have any chronic conditions? (i.e. diabetes, asthma, etc.) ---Yes  List chronic conditions. ---colon cancer; had several lymph nodes removed, CVA;  Is this a behavioral health or substance abuse call? ---No     Guidelines    Guideline Title Affirmed Question Affirmed Notes  Vaginal Bleeding - Postmenopausal Postmenopausal vaginal bleeding    Final Disposition User   See PCP within 2 Maudry Diego, RN, Vanita Ingles    Comments  Caller states her aunt has vaginal bleeding. She has had many lymph nodes removed due to cancer. She is not with her aunt.  Appt scheduled for 02/15/2016 at 1:30 pm with Sallee Provencal   Disagree/Comply: Comply

## 2016-02-15 NOTE — Telephone Encounter (Signed)
Patient has appt scheduled for today at 1:30

## 2016-02-18 ENCOUNTER — Telehealth: Payer: Self-pay | Admitting: Adult Health

## 2016-02-18 NOTE — Telephone Encounter (Signed)
Lab results are back, but without interpretation. Can you please give insight on results?

## 2016-02-18 NOTE — Telephone Encounter (Signed)
Niece would like to have the lab results from 6/30

## 2016-02-18 NOTE — Telephone Encounter (Signed)
April [name] is back Wednesday- he can interpret at that time. After hours and patient will not be called until Wednesday anyway

## 2016-02-20 NOTE — Telephone Encounter (Signed)
Please call niece and inform them that the patients labs look fine. She is slightly anemic but this is probably due to the blood loss and will correct itself.

## 2016-02-20 NOTE — Telephone Encounter (Signed)
Patient's niece notified and verbalized understanding.

## 2016-02-21 DIAGNOSIS — R269 Unspecified abnormalities of gait and mobility: Secondary | ICD-10-CM | POA: Diagnosis not present

## 2016-02-21 DIAGNOSIS — R569 Unspecified convulsions: Secondary | ICD-10-CM | POA: Diagnosis not present

## 2016-02-21 DIAGNOSIS — I679 Cerebrovascular disease, unspecified: Secondary | ICD-10-CM | POA: Diagnosis not present

## 2016-03-01 DIAGNOSIS — R69 Illness, unspecified: Secondary | ICD-10-CM | POA: Diagnosis not present

## 2016-03-06 DIAGNOSIS — N950 Postmenopausal bleeding: Secondary | ICD-10-CM | POA: Diagnosis not present

## 2016-03-10 DIAGNOSIS — C7A019 Malignant carcinoid tumor of the small intestine, unspecified portion: Secondary | ICD-10-CM | POA: Diagnosis not present

## 2016-03-10 DIAGNOSIS — C7A8 Other malignant neuroendocrine tumors: Secondary | ICD-10-CM | POA: Diagnosis not present

## 2016-03-14 ENCOUNTER — Other Ambulatory Visit: Payer: Self-pay | Admitting: General Surgery

## 2016-03-14 DIAGNOSIS — C7A8 Other malignant neuroendocrine tumors: Secondary | ICD-10-CM

## 2016-03-17 DIAGNOSIS — R69 Illness, unspecified: Secondary | ICD-10-CM | POA: Diagnosis not present

## 2016-03-20 ENCOUNTER — Encounter: Payer: Self-pay | Admitting: Nurse Practitioner

## 2016-03-20 ENCOUNTER — Ambulatory Visit
Admission: RE | Admit: 2016-03-20 | Discharge: 2016-03-20 | Disposition: A | Discharge status: Home / Self Care | Payer: Medicare HMO | Source: Ambulatory Visit | Attending: General Surgery | Admitting: General Surgery

## 2016-03-20 ENCOUNTER — Ambulatory Visit (INDEPENDENT_AMBULATORY_CARE_PROVIDER_SITE_OTHER): Payer: Medicare HMO | Admitting: Nurse Practitioner

## 2016-03-20 VITALS — BP 133/77 | HR 85 | Ht 59.0 in | Wt 117.2 lb

## 2016-03-20 DIAGNOSIS — I10 Essential (primary) hypertension: Secondary | ICD-10-CM

## 2016-03-20 DIAGNOSIS — G40909 Epilepsy, unspecified, not intractable, without status epilepticus: Secondary | ICD-10-CM

## 2016-03-20 DIAGNOSIS — I6330 Cerebral infarction due to thrombosis of unspecified cerebral artery: Secondary | ICD-10-CM | POA: Diagnosis not present

## 2016-03-20 DIAGNOSIS — C7A012 Malignant carcinoid tumor of the ileum: Secondary | ICD-10-CM | POA: Diagnosis not present

## 2016-03-20 DIAGNOSIS — C7A8 Other malignant neuroendocrine tumors: Secondary | ICD-10-CM

## 2016-03-20 MED ORDER — LEVETIRACETAM 500 MG PO TABS: 500.0000 mg | ORAL_TABLET | Freq: Two times a day (BID) | ORAL | 3 refills | Status: DC

## 2016-03-20 MED ORDER — IOPAMIDOL (ISOVUE-300) INJECTION 61%
100.0000 mL | Freq: Once | INTRAVENOUS | Status: AC | PRN
  Administered 2016-03-20: 100 mL via INTRAVENOUS

## 2016-03-20 NOTE — Progress Notes (Signed)
[facility]  PATIENT: April [name] DOB: 30-Sep-1940   REASON FOR VISIT: Follow-up for seizure disorder, history of stroke  HISTORY FROM: Patient and niece April [name] POA    HISTORY OF PRESENT ILLNESS:April [name] is a 75 years old female, referred by her primary care nurse practitioner Debbrah Alar, accompanied by her niece April [name], who is in this process of become her power of attorney, and her niece April [name] at today's evaluation,  She had past medical history of hypertension, hyperlipidemia, diabetes, most recent A1c was 10.7, she was admitted to the hospital March 5 to October 25 2014, after she was found by her neighbor that she is confused  Had extensive evaluation during her hospital stay, EEG October 23 2014 showed buildup of occipital spike and slow wave activity from the left  occipital region. It achieves a frequency of 3-4 Hz with some spread noted to the right hemisphere as well. These episodes last from 100 to 155 seconds with normal awake background  activity returning after these periods. Suggestive of subclinical seizure, she was put on Keppra 500 mg twice a day, repeat EEG October 25 2014, showed significant improvement  Echocardiogram ejection fraction 60%, ultrasound of carotid artery showed less than 39% stenosis, anterograde flow of bilateral vertebral system   I have reviewed MRI of brain, MRA of the brain October 21 2014, MRI of the brain October 23 2014: 5 mm focus of restricted diffusion within the inferior right thalamus as above, highly suspicious for a small acute ischemic  infarct. Generalized cerebral atrophy with moderate chronic microvascular ischemic disease. There was no significant medium large size vessel disease by MRA of the brain, Laboratory evaluation showed  LDL 46, A1c 10.7, normal vitamin B12, TSH,   Before she was admitted to the hospital, she lives by herself, has widowed for many years, has no relationship to her only  son, was still driving, but he was noticed by her nieces, who intact with her frequently, she has gradual onset memory trouble, also moody, tends to cry, she has been missing her diabetic, hypertension, hyperlipidemia medication regularly,   Since hospital admission, she was discharged to [facility] place, continue has intermittent memory loss, mild confusion, in addition, she was noted to have increased gait difficulty, urinary urgency, occasionally bladder accident, constipation, with laxative, she occasionally has bowel incontinence, contributed to not able to make to the bathroom by Asst. in timely fashion,   She is tearful emotional during today's interview, she will be moved to a long-term assisted living, her niece is in the process of getting power of attorney  Update 03/23/2015 PS: She is accompanied today by her daughter following her last visit with Dr. Krista Blue in March 2016. She reports overall significant improvement in her confusion and memory difficulties. She's had no recurrent stroke or TIA symptoms since my last hospital visit with her in March 2015 when she had a small right thalamic infarct. She also had a urinary tract infection at that time. Patient is now been diagnosed with a new colonic mass and plans to see GI surgeon Dr. Marcello Moores for consideration for surgery. She is presently living in assisted living and has recently started walking with a walker and is doing well. She is tolerating Keppra well without any side effects. She has had no recurrent seizure episodes. She states her blood pressure is normal and today it is slightly low at 103/67. Her fasting sugars have ranged consistently in the 90 and today was 93 mg  percent on Mini-Mental status exam today she scored 29/30.Marland Kitchen Update 2/2/2017PS : She returns for follow-up after last visit 6 months ago. She is doing well she's had no recurrent seizures. She remains on Keppra. She's had no TIA or stroke symptoms. She lives alone at home. She has  some help with household work and her medications. She feels her memory is stable. Blood pressure is usually well controlled though it is elevated at 161/83 today. She has regular follow-up with her primary care physician. She has chronic urinary tract infections and in fact just finished a course of antibiotics. She is currently seeing urologist Dr. Diona Fanti for that. She plans on getting her annual physical to the end of this month and will have lipid profile checked at that time. She is also seen Dr. Valentina Shaggy last month UPDATE 08/03/2017CM April [name], 75 year old female returns for follow-up. She has not had recurrent seizure events since hospitalization in March 2015. No further stroke or TIA symptoms. She continues to live at home with family checking in on her frequently. No safety issues identified she can manage to do her ADLs. Medications are managed by her niece in a medi planner. She has not fallen. She ambulates with a rolling walker. She had surgery last year for a colon mass. She is having a repeat CT of the abdomen today. She returns for reevaluation  REVIEW OF SYSTEMS: Full 14 system review of systems performed and notable only for those listed, all others are neg:  Constitutional: neg  Cardiovascular: neg Ear/Nose/Throat: neg  Skin: neg Eyes: neg Respiratory: neg Gastroitestinal: neg  Hematology/Lymphatic: neg  Endocrine: neg Musculoskeletal:neg Allergy/Immunology: neg Neurological: neg Psychiatric: neg Sleep : neg   ALLERGIES: Allergies  Allergen Reactions  . Codeine Nausea And Vomiting  . Sulfa Antibiotics Nausea And Vomiting    HOME MEDICATIONS: Outpatient Medications Prior to Visit  Medication Sig Dispense Refill  . ACCU-CHEK FASTCLIX LANCETS MISC Test once daily. 102 each 5  . aspirin EC 325 MG tablet Take 1 tablet (325 mg total) by mouth daily. 30 tablet 5  . atorvastatin (LIPITOR) 20 MG tablet Take 1 tablet (20 mg total) by mouth daily at 6 PM. 90 tablet 1  .  bisacodyl (DULCOLAX) 10 MG suppository Place 1 suppository (10 mg total) rectally daily as needed for moderate constipation. 12 suppository 0  . carvedilol (COREG) 3.125 MG tablet TAKE ONE TABLET BY MOUTH TWICE DAILY WITH A MEAL 180 tablet 2  . Cyanocobalamin (VITAMIN B-12) 1000 MCG SUBL Place 1 tablet (1,000 mcg total) under the tongue daily. 90 tablet 1  . furosemide (LASIX) 40 MG tablet TAKE 1 TABLET BY MOUTH DAILY. IF YOU GAIN MORE THAN 3 POUNDS IN 1 DAY OR 5 POUNDS IN 1 WEEK TAKE AN EXTRA DOSE 180 tablet 2  . glimepiride (AMARYL) 2 MG tablet Take 1 tablet (2 mg total) by mouth daily with breakfast. 90 tablet 1  . glucose blood (ACCU-CHEK SMARTVIEW) test strip Use daily 100 each 3  . glucose blood (ACCU-CHEK SMARTVIEW) test strip Test once daily. 100 each 3  . isosorbide dinitrate (ISORDIL) 5 MG tablet TAKE ONE TABLET BY MOUTH THREE TIMES DAILY 90 tablet 2  . levETIRAcetam (KEPPRA) 500 MG tablet Take 1 tablet (500 mg total) by mouth 2 (two) times daily. 180 tablet 0  . metFORMIN (GLUCOPHAGE) 500 MG tablet TAKE ONE TABLET BY MOUTH TWICE DAILY WITH A MEAL 180 tablet 2  . omeprazole (PRILOSEC) 20 MG capsule Take 20 mg by mouth daily.    Marland Kitchen  senna (SENOKOT) 8.6 MG TABS tablet Take 1 tablet (8.6 mg total) by mouth at bedtime. 120 each 1  . traMADol (ULTRAM) 50 MG tablet Take by mouth every 6 (six) hours as needed. Reported on 02/15/2016    . baclofen (LIORESAL) 10 MG tablet Take 10 mg by mouth as needed for muscle spasms. Reported on 02/15/2016    . ondansetron (ZOFRAN-ODT) 4 MG disintegrating tablet Take 4 mg by mouth every 8 (eight) hours as needed for nausea or vomiting. Reported on 02/15/2016    . saccharomyces boulardii (FLORASTOR) 250 MG capsule Take 1 capsule (250 mg total) by mouth 2 (two) times daily. (Patient not taking: Reported on 03/20/2016) 90 capsule 0  . tamsulosin (FLOMAX) 0.4 MG CAPS capsule Take 1 capsule by mouth daily.     No facility-administered medications prior to visit.      PAST MEDICAL HISTORY: Past Medical History:  Diagnosis Date  . Aphasia   . Cerebral infarction due to unspecified mechanism   . Chronic diastolic CHF (congestive heart failure), grade I 01/18/2015   ?   Marland Kitchen Diabetes mellitus   . Family history of adverse reaction to anesthesia   . GERD (gastroesophageal reflux disease) 05/06/2013  . Gram-positive cocci bacteremia   . HYPERLIPIDEMIA 10/15/2006   Qualifier: Diagnosis of  By: Beryle Lathe    . Hypertension   . Rectum bleeding   . Retinopathy 06/06/2013   Per eye exam 05/31/13 eye exam, Moderate retinopathy   . 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 Seizures ([place])   . Stroke [facility]) 10/21/2014    PAST SURGICAL HISTORY: Past Surgical History:  Procedure Laterality Date  . ABDOMINAL HYSTERECTOMY  1972  . APPENDECTOMY  1972  . COLONOSCOPY WITH PROPOFOL N/A 04/06/2015   Procedure: COLONOSCOPY WITH PROPOFOL;  Surgeon: Carol Ada, MD;  Location: [facility] ENDOSCOPY;  Service: Endoscopy;  Laterality: N/A;  . CYSTOSCOPY W/ URETERAL STENT PLACEMENT Left 02/08/2015   Procedure: CYSTOSCOPY WITH RETROGRADE left PYELOGRAM/URETERAL STENT PLACEMENT left insertion of foley;  Surgeon: Franchot Gallo, MD;  Location: [facility] ORS;  Service: Urology;  Laterality: Left;  . EYE SURGERY  08/2014   bilateral cataract with lens implants  . LAPAROSCOPIC PARTIAL COLECTOMY N/A 05/03/2015   Procedure: LAPAROSCOPIC PARTIAL COLECTOMY;  Surgeon: [name], MD;  Location: [facility] ORS;  Service: General;  Laterality: N/A;  . LEFT LEG FRACTURE     broken leg-left in 3 places  . TEE WITHOUT CARDIOVERSION N/A 10/24/2014   Procedure: TRANSESOPHAGEAL ECHOCARDIOGRAM (TEE);  Surgeon: Dorothy Spark, MD;  Location: [facility] ENDOSCOPY;  Service: Cardiovascular;  Laterality: N/A;  . TONSILLECTOMY     as child-age 73    FAMILY HISTORY: Family History  Problem Relation Age of Onset  . Diabetes Mother   . Stomach cancer Mother   . Arthritis Maternal Grandmother    . Heart disease Maternal Grandmother 80    SOCIAL HISTORY: Social History   Social History  . Marital status: Widowed    Spouse name: N/A  . Number of children: 1  . Years of education: 2   Occupational History  . Retired    Social History Main Topics  . Smoking status: Never Smoker  . Smokeless tobacco: Never Used  . Alcohol use No  . Drug use: No  . Sexual activity: Not on file   Other Topics Concern  . Not on file   Social History Narrative   Widowed, husband died in 2001/12/02.   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   Right-handed.   1 cup caffeine per day.   She is being cared for by her two neices.        PHYSICAL EXAM  Vitals:   03/20/16 0824  BP: 133/77  Pulse: 85  Weight: 117 lb 3.2 oz (53.2 kg)  Height: 4\' 11"  (1.499 m)   Body mass index is 23.67 kg/m.  Generalized: Well developed, in no acute distress  Head: normocephalic and atraumatic,. Oropharynx benign  Neck: Supple, no carotid bruits  Cardiac: Regular rate rhythm, no murmur  Musculoskeletal: No deformity   Neurological examination   Mentation: Alert oriented to time, place, history taking. Attention span and concentration appropriate. Recent and remote memory intact.  Follows all commands speech and language fluent.   Cranial nerve II-XII: Fundoscopic exam reveals sharp disc margins.Pupils were equal round reactive to light extraocular movements were full, visual field were full on confrontational test. Facial sensation and strength were normal. hearing was intact to finger rubbing bilaterally. Uvula tongue midline. head turning and shoulder shrug were normal and symmetric.Tongue protrusion into cheek strength was normal. Motor: normal bulk and tone, full strength in the BUE, BLE, fine finger movements normal, no pronator drift.  No focal weakness Sensory: normal and symmetric to light touch, pinprick, and  Vibration, in the upper and lower extremities Coordination: finger-nose-finger, heel-to-shin bilaterally, no dysmetria Reflexes: 1+ upper lower and symmetric plantar responses were flexor bilaterally. Gait and Station: Rising up from seated position without assistance, ambulates with a rolling walker, gait is slow and steady  DIAGNOSTIC DATA (LABS, IMAGING, TESTING) - I reviewed patient records, labs, notes, testing and imaging myself where available.  Lab Results  Component Value Date   WBC 10.0 02/15/2016   HGB 11.1 (L) 02/15/2016   HCT 33.6 (L) 02/15/2016   MCV 87.2 02/15/2016   PLT 547.0 (H) 02/15/2016      Component Value Date/Time   NA 139 10/12/2015 0912   K 3.8 10/12/2015 0912   CL 98 10/12/2015 0912   CO2 34 (H) 10/12/2015 0912   GLUCOSE 118 (H) 10/12/2015 0912   BUN 21 10/12/2015 0912   CREATININE 0.87 10/12/2015 0912   CREATININE 0.52 05/17/2013 1144   CALCIUM 9.6 10/12/2015 0912   PROT 7.9 10/12/2015 0912   ALBUMIN 4.2 10/12/2015 0912   AST 16 10/12/2015 0912   ALT 12 10/12/2015 0912   ALKPHOS 74 10/12/2015 0912   BILITOT 0.4 10/12/2015 0912   GFRNONAA >60 05/06/2015 0504   GFRAA >60 05/06/2015 0504   Lab Results  Component Value Date   CHOL 140 10/12/2015   HDL 41.60 10/12/2015   LDLCALC 59 10/12/2015   TRIG 199.0 (H) 10/12/2015   CHOLHDL 3 10/12/2015   Lab Results  Component Value Date   HGBA1C 6.9 (H) 10/12/2015   Lab Results  Component Value Date   VITAMINB12 605 10/22/2014   Lab Results  Component Value Date   TSH 4.92 (H) 10/12/2015      ASSESSMENT AND PLAN Ginnifer Oncale Ziska is a 75 y.o. 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  Records reviewed  PLAN: Continue on aspirin for stroke prevention Strict control of hypertension with blood pressure goal below 130/90  todays reading 133/77 lipids with LDL cholesterol goal below 70 mg percent  Last was 59 diabetes with hemoglobin A1c goal below 6.5%. Last was 6.9 Continue Keppra 500 mg twice daily for seizures will refill Follow up in 6 months Dennie Bible, [facility], [facility], APRN  [facility] [address] [place], [place] [address] [phone]

## 2016-03-20 NOTE — Patient Instructions (Addendum)
Continue on aspirin for stroke prevention Strict control of hypertension with blood pressure goal below 130/90  todays reading 133/77 lipids with LDL cholesterol goal below 70 mg percent  Last was 59 diabetes with hemoglobin A1c goal below 6.5%. Last was 6.9 Continue Keppra 500 mg twice daily for seizures will refill Follow up in 6 months

## 2016-03-25 NOTE — Progress Notes (Signed)
I agree with the above plan 

## 2016-03-27 DIAGNOSIS — N950 Postmenopausal bleeding: Secondary | ICD-10-CM | POA: Diagnosis not present

## 2016-03-30 ENCOUNTER — Other Ambulatory Visit: Payer: Self-pay | Admitting: Adult Health

## 2016-04-03 DIAGNOSIS — E103399 Type 1 diabetes mellitus with moderate nonproliferative diabetic retinopathy without macular edema, unspecified eye: Secondary | ICD-10-CM | POA: Diagnosis not present

## 2016-04-03 DIAGNOSIS — H40013 Open angle with borderline findings, low risk, bilateral: Secondary | ICD-10-CM | POA: Diagnosis not present

## 2016-04-03 DIAGNOSIS — E1139 Type 2 diabetes mellitus with other diabetic ophthalmic complication: Secondary | ICD-10-CM | POA: Diagnosis not present

## 2016-04-03 DIAGNOSIS — H35372 Puckering of macula, left eye: Secondary | ICD-10-CM | POA: Diagnosis not present

## 2016-04-22 DIAGNOSIS — C7A8 Other malignant neuroendocrine tumors: Secondary | ICD-10-CM | POA: Diagnosis not present

## 2016-04-24 DIAGNOSIS — N950 Postmenopausal bleeding: Secondary | ICD-10-CM | POA: Diagnosis not present

## 2016-04-28 DIAGNOSIS — Z961 Presence of intraocular lens: Secondary | ICD-10-CM | POA: Diagnosis not present

## 2016-04-28 DIAGNOSIS — E113393 Type 2 diabetes mellitus with moderate nonproliferative diabetic retinopathy without macular edema, bilateral: Secondary | ICD-10-CM | POA: Diagnosis not present

## 2016-04-28 DIAGNOSIS — H43812 Vitreous degeneration, left eye: Secondary | ICD-10-CM | POA: Diagnosis not present

## 2016-05-11 ENCOUNTER — Other Ambulatory Visit: Payer: Self-pay | Admitting: Adult Health

## 2016-05-13 ENCOUNTER — Other Ambulatory Visit: Payer: Self-pay

## 2016-05-13 MED ORDER — ATORVASTATIN CALCIUM 20 MG PO TABS: 20.0000 mg | ORAL_TABLET | Freq: Every day | ORAL | 1 refills | Status: DC

## 2016-05-13 NOTE — Telephone Encounter (Signed)
Ok to refill 

## 2016-07-12 DIAGNOSIS — Z23 Encounter for immunization: Secondary | ICD-10-CM | POA: Diagnosis not present

## 2016-07-13 ENCOUNTER — Other Ambulatory Visit: Payer: Self-pay | Admitting: Adult Health

## 2016-07-29 ENCOUNTER — Other Ambulatory Visit: Payer: Self-pay | Admitting: Adult Health

## 2016-07-29 DIAGNOSIS — Z1231 Encounter for screening mammogram for malignant neoplasm of breast: Secondary | ICD-10-CM

## 2016-08-22 ENCOUNTER — Ambulatory Visit: Payer: Medicare HMO

## 2016-09-13 ENCOUNTER — Other Ambulatory Visit: Payer: Self-pay | Admitting: Adult Health

## 2016-09-15 ENCOUNTER — Ambulatory Visit
Admission: RE | Admit: 2016-09-15 | Discharge: 2016-09-15 | Disposition: A | Discharge status: Home / Self Care | Payer: Medicare HMO | Source: Ambulatory Visit | Attending: Adult Health | Admitting: Adult Health

## 2016-09-15 DIAGNOSIS — Z1231 Encounter for screening mammogram for malignant neoplasm of breast: Secondary | ICD-10-CM | POA: Diagnosis not present

## 2016-09-15 NOTE — Telephone Encounter (Signed)
Ok to refill for one year  

## 2016-09-16 ENCOUNTER — Other Ambulatory Visit: Payer: Self-pay | Admitting: Adult Health

## 2016-09-16 DIAGNOSIS — R928 Other abnormal and inconclusive findings on diagnostic imaging of breast: Secondary | ICD-10-CM

## 2016-09-19 ENCOUNTER — Ambulatory Visit
Admission: RE | Admit: 2016-09-19 | Discharge: 2016-09-19 | Disposition: A | Discharge status: Home / Self Care | Payer: Medicare HMO | Source: Ambulatory Visit | Attending: Adult Health | Admitting: Adult Health

## 2016-09-19 DIAGNOSIS — R928 Other abnormal and inconclusive findings on diagnostic imaging of breast: Secondary | ICD-10-CM

## 2016-09-19 DIAGNOSIS — N6489 Other specified disorders of breast: Secondary | ICD-10-CM | POA: Diagnosis not present

## 2016-09-22 DIAGNOSIS — R339 Retention of urine, unspecified: Secondary | ICD-10-CM | POA: Diagnosis not present

## 2016-09-22 DIAGNOSIS — R3912 Poor urinary stream: Secondary | ICD-10-CM | POA: Diagnosis not present

## 2016-09-25 ENCOUNTER — Ambulatory Visit: Payer: Medicare HMO | Admitting: Nurse Practitioner

## 2016-10-03 DIAGNOSIS — H40013 Open angle with borderline findings, low risk, bilateral: Secondary | ICD-10-CM | POA: Diagnosis not present

## 2016-10-03 DIAGNOSIS — H04123 Dry eye syndrome of bilateral lacrimal glands: Secondary | ICD-10-CM | POA: Diagnosis not present

## 2016-10-03 DIAGNOSIS — H21232 Degeneration of iris (pigmentary), left eye: Secondary | ICD-10-CM | POA: Diagnosis not present

## 2016-10-17 ENCOUNTER — Encounter: Payer: Self-pay | Admitting: Adult Health

## 2016-10-17 ENCOUNTER — Ambulatory Visit (INDEPENDENT_AMBULATORY_CARE_PROVIDER_SITE_OTHER): Payer: Medicare HMO | Admitting: Adult Health

## 2016-10-17 VITALS — BP 124/72 | Temp 98.3°F | Ht 59.0 in | Wt 121.4 lb

## 2016-10-17 DIAGNOSIS — I10 Essential (primary) hypertension: Secondary | ICD-10-CM

## 2016-10-17 DIAGNOSIS — R319 Hematuria, unspecified: Secondary | ICD-10-CM

## 2016-10-17 DIAGNOSIS — E559 Vitamin D deficiency, unspecified: Secondary | ICD-10-CM

## 2016-10-17 DIAGNOSIS — Z0001 Encounter for general adult medical examination with abnormal findings: Secondary | ICD-10-CM

## 2016-10-17 DIAGNOSIS — Z794 Long term (current) use of insulin: Secondary | ICD-10-CM | POA: Diagnosis not present

## 2016-10-17 DIAGNOSIS — Z23 Encounter for immunization: Secondary | ICD-10-CM

## 2016-10-17 DIAGNOSIS — Z0000 Encounter for general adult medical examination without abnormal findings: Secondary | ICD-10-CM

## 2016-10-17 DIAGNOSIS — E785 Hyperlipidemia, unspecified: Secondary | ICD-10-CM | POA: Diagnosis not present

## 2016-10-17 DIAGNOSIS — E2839 Other primary ovarian failure: Secondary | ICD-10-CM

## 2016-10-17 DIAGNOSIS — G40909 Epilepsy, unspecified, not intractable, without status epilepticus: Secondary | ICD-10-CM

## 2016-10-17 DIAGNOSIS — IMO0002 Reserved for concepts with insufficient information to code with codable children: Secondary | ICD-10-CM

## 2016-10-17 DIAGNOSIS — E1165 Type 2 diabetes mellitus with hyperglycemia: Secondary | ICD-10-CM

## 2016-10-17 LAB — URINALYSIS, MICROSCOPIC ONLY: RBC / HPF: NONE SEEN (ref 0–?)

## 2016-10-17 LAB — HEPATIC FUNCTION PANEL
ALT: 8 U/L (ref 0–35)
AST: 8 U/L (ref 0–37)
Albumin: 3.7 g/dL (ref 3.5–5.2)
Alkaline Phosphatase: 86 U/L (ref 39–117)
BILIRUBIN DIRECT: 0.1 mg/dL (ref 0.0–0.3)
BILIRUBIN TOTAL: 0.3 mg/dL (ref 0.2–1.2)
Total Protein: 7.5 g/dL (ref 6.0–8.3)

## 2016-10-17 LAB — POC URINALSYSI DIPSTICK (AUTOMATED)
Bilirubin, UA: NEGATIVE
GLUCOSE UA: NEGATIVE
Ketones, UA: NEGATIVE
Nitrite, UA: POSITIVE
SPEC GRAV UA: 1.015
UROBILINOGEN UA: 0.2
pH, UA: 6.5

## 2016-10-17 LAB — TSH: TSH: 4.76 u[IU]/mL — ABNORMAL HIGH (ref 0.35–4.50)

## 2016-10-17 LAB — BASIC METABOLIC PANEL
BUN: 37 mg/dL — ABNORMAL HIGH (ref 6–23)
CALCIUM: 9.4 mg/dL (ref 8.4–10.5)
CO2: 29 meq/L (ref 19–32)
CREATININE: 1.07 mg/dL (ref 0.40–1.20)
Chloride: 103 mEq/L (ref 96–112)
GFR: 53.09 mL/min — ABNORMAL LOW (ref >60.00)
GLUCOSE: 153 mg/dL — AB (ref 70–99)
Potassium: 4.7 mEq/L (ref 3.5–5.1)
Sodium: 142 mEq/L (ref 135–145)

## 2016-10-17 LAB — LIPID PANEL
CHOL/HDL RATIO: 3
Cholesterol: 120 mg/dL (ref 0–200)
HDL: 34.8 mg/dL — AB (ref >39.00)
LDL CALC: 54 mg/dL (ref 0–99)
NonHDL: 85.59
TRIGLYCERIDES: 159 mg/dL — AB (ref 0.0–149.0)
VLDL: 31.8 mg/dL (ref 0.0–40.0)

## 2016-10-17 LAB — CBC WITH DIFFERENTIAL/PLATELET
BASOS ABS: 0.1 10*3/uL (ref 0.0–0.1)
Basophils Relative: 0.5 % (ref 0.0–3.0)
EOS ABS: 0.3 10*3/uL (ref 0.0–0.7)
Eosinophils Relative: 2.8 % (ref 0.0–5.0)
HCT: 32 % — ABNORMAL LOW (ref 36.0–46.0)
Hemoglobin: 10.2 g/dL — ABNORMAL LOW (ref 12.0–15.0)
LYMPHS ABS: 3 10*3/uL (ref 0.7–4.0)
Lymphocytes Relative: 26.1 % (ref 12.0–46.0)
MCHC: 31.9 g/dL (ref 30.0–36.0)
MCV: 89.5 fl (ref 78.0–100.0)
MONO ABS: 1.3 10*3/uL — AB (ref 0.1–1.0)
Monocytes Relative: 11.3 % (ref 3.0–12.0)
NEUTROS ABS: 6.8 10*3/uL (ref 1.4–7.7)
NEUTROS PCT: 59.3 % (ref 43.0–77.0)
Platelets: 458 10*3/uL — ABNORMAL HIGH (ref 150.0–400.0)
RBC: 3.58 Mil/uL — ABNORMAL LOW (ref 3.87–5.11)
RDW: 14.8 % (ref 11.5–15.5)
WBC: 11.5 10*3/uL — ABNORMAL HIGH (ref 4.0–10.5)

## 2016-10-17 LAB — HEMOGLOBIN A1C: Hgb A1c MFr Bld: 7.6 % — ABNORMAL HIGH (ref 4.6–6.5)

## 2016-10-17 LAB — VITAMIN D 25 HYDROXY (VIT D DEFICIENCY, FRACTURES): VITD: 28.32 ng/mL — AB (ref 30.00–100.00)

## 2016-10-17 NOTE — Patient Instructions (Signed)
It was great seeing you today!  I will follow up with you regarding your labs.   Someone will call you to schedule the bone density screen   Please follow up with me in 6 months

## 2016-10-17 NOTE — Progress Notes (Signed)
Subjective:    Patient ID: April [name], female    DOB: July 17, 1941, 76 y.o.   MRN: [id number]  HPI  Patient presents for yearly preventative medicine examination. She is a pleasant 76 year old female who  has a past medical history of Aphasia; Cerebral infarction due to unspecified mechanism; Chronic diastolic CHF (congestive heart failure), grade I (01/18/2015); Diabetes mellitus; Family history of adverse reaction to anesthesia; GERD (gastroesophageal reflux disease) (05/06/2013); Gram-positive cocci bacteremia; HYPERLIPIDEMIA (10/15/2006); Hypertension; Rectum bleeding; Retinopathy (06/06/2013); Seizure disorder, status epilepticus, nonconvulsive ([place]); Seizures ([place]); and Stroke ([place]) (10/21/2014).  All immunizations and health maintenance protocols were reviewed with the patient and needed orders were placed. She is due for Prevnar 13  Appropriate screening laboratory values were ordered for the patient including screening of hyperlipidemia, renal function and hepatic function.  Medication reconciliation,  past medical history, social history, problem list and allergies were reviewed in detail with the patient  Goals were established with regard to weight loss, exercise, and  diet in compliance with medications. She does not follow a specific diet and does not exercise on a regular basis.   End of life planning was discussed. She has an advanced directive and living will.   She has not had any hospital visits over the last year.   She was seen by Cecille Rubin in 03/2016 who has kept her on Keppra 500mg  BID for seizures. Continue with ASA for stroke prevention. She has a follow up appointment with Hoyle Sauer at the end of this month. Denies any recent seizures  She has also been seen by Urology this year.   Today in the office she reports that she continues to live at home and family comes over often to check in on her.   She is up to date on her colonoscopy and mammogram. She has  seen her eye doctors this year. She would like to have a bone density screen done    Over all she feels as though she is doing well. She has no acute complaints.    Review of Systems  Constitutional: Negative.   HENT: Negative.   Eyes: Negative.   Respiratory: Negative.   Cardiovascular: Negative.   Gastrointestinal: Negative.   Endocrine: Negative.   Genitourinary: Negative.   Musculoskeletal: Negative.   Skin: Negative.   Allergic/Immunologic: Negative.   Neurological: Negative.   Hematological: Negative.   Psychiatric/Behavioral: Negative.   All other systems reviewed and are negative.  Past Medical History:  Diagnosis Date  . Aphasia   . Cerebral infarction due to unspecified mechanism   . Chronic diastolic CHF (congestive heart failure), grade I 01/18/2015   ?   Marland Kitchen Diabetes mellitus   . Family history of adverse reaction to anesthesia   . GERD (gastroesophageal reflux disease) 05/06/2013  . Gram-positive cocci bacteremia   . HYPERLIPIDEMIA 10/15/2006   Qualifier: Diagnosis of  By: Beryle Lathe    . Hypertension   . Rectum bleeding   . Retinopathy 06/06/2013   Per eye exam 05/31/13 eye exam, Moderate retinopathy   . Seizure disorder, status epilepticus, nonconvulsive ([place])   . Seizures ([place])   . Stroke [facility]) 10/21/2014    Social History   Social History  . Marital status: Widowed    Spouse name: N/A  . Number of children: 1  . Years of education: 26   Occupational History  . Retired    Social History Main Topics  . Smoking status: Never Smoker  . Smokeless tobacco: Never Used  .  Alcohol use No  . Drug use: No  . Sexual activity: Not on file   Other Topics Concern  . Not on file   Social History Narrative   Widowed, husband died in Dec 10, 2001.   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    Right-handed.   1 cup caffeine per day.   She is being cared for by her two neices.       Past Surgical History:  Procedure Laterality Date  . ABDOMINAL HYSTERECTOMY  1972  . APPENDECTOMY  1972  . COLONOSCOPY WITH PROPOFOL N/A 04/06/2015   Procedure: COLONOSCOPY WITH PROPOFOL;  Surgeon: Carol Ada, MD;  Location: [facility] ENDOSCOPY;  Service: Endoscopy;  Laterality: N/A;  . CYSTOSCOPY W/ URETERAL STENT PLACEMENT Left 02/08/2015   Procedure: CYSTOSCOPY WITH RETROGRADE left PYELOGRAM/URETERAL STENT PLACEMENT left insertion of foley;  Surgeon: Franchot Gallo, MD;  Location: [facility] ORS;  Service: Urology;  Laterality: Left;  . EYE SURGERY  08/2014   bilateral cataract with lens implants  . LAPAROSCOPIC PARTIAL COLECTOMY N/A 05/03/2015   Procedure: LAPAROSCOPIC PARTIAL COLECTOMY;  Surgeon: [name], MD;  Location: [facility] ORS;  Service: General;  Laterality: N/A;  . LEFT LEG FRACTURE     broken leg-left in 3 places  . TEE WITHOUT CARDIOVERSION N/A 10/24/2014   Procedure: TRANSESOPHAGEAL ECHOCARDIOGRAM (TEE);  Surgeon: Dorothy Spark, MD;  Location[facility] ENDOSCOPY;  Service: Cardiovascular;  Laterality: N/A;  . TONSILLECTOMY     as child-age 16    Family History  Problem Relation Age of Onset  . Diabetes Mother   . Stomach cancer Mother   . Arthritis Maternal Grandmother   . Heart disease Maternal Grandmother 80    Allergies  Allergen Reactions  . Codeine Nausea And Vomiting  . Sulfa Antibiotics Nausea And Vomiting    Current Outpatient Prescriptions on File Prior to Visit  Medication Sig Dispense Refill  . ACCU-CHEK FASTCLIX LANCETS MISC Test once daily. 102 each 5  . aspirin EC 325 MG tablet Take 1 tablet (325 mg total) by mouth daily. 30 tablet 5  . atorvastatin (LIPITOR) 20 MG tablet Take 1 tablet (20 mg total) by mouth daily at 6 PM. 90 tablet 1  . baclofen (LIORESAL) 10 MG tablet Take 10 mg by mouth as needed for muscle spasms. Reported on 02/15/2016    . bisacodyl (DULCOLAX) 10 MG  suppository Place 1 suppository (10 mg total) rectally daily as needed for moderate constipation. 12 suppository 0  . carvedilol (COREG) 3.125 MG tablet TAKE ONE TABLET BY MOUTH TWICE DAILY WITH A MEAL 180 tablet 3  . Cyanocobalamin (VITAMIN B-12) 1000 MCG SUBL Place 1 tablet (1,000 mcg total) under the tongue daily. 90 tablet 1  . furosemide (LASIX) 40 MG tablet TAKE 1 TABLET BY MOUTH DAILY. IF YOU GAIN MORE THAN 3 POUNDS IN 1 DAY OR 5 POUNDS IN 1 WEEK TAKE AN EXTRA DOSE 180 tablet 2  . glimepiride (AMARYL) 2 MG tablet Take 1 tablet (2 mg total) by mouth daily with breakfast. 90 tablet 1  . glimepiride (AMARYL) 2 MG tablet TAKE ONE TABLET BY MOUTH ONCE DAILY WITH  BREAKFAST 90 tablet 4  . glucose blood (ACCU-CHEK SMARTVIEW) test strip Use daily 100 each 3  . glucose blood (ACCU-CHEK SMARTVIEW) test strip Test once daily. 100 each 3  .  isosorbide dinitrate (ISORDIL) 5 MG tablet TAKE ONE TABLET BY MOUTH THREE TIMES DAILY 90 tablet 4  . levETIRAcetam (KEPPRA) 500 MG tablet Take 1 tablet (500 mg total) by mouth 2 (two) times daily. 180 tablet 3  . metFORMIN (GLUCOPHAGE) 500 MG tablet TAKE ONE TABLET BY MOUTH TWICE DAILY WITH  A  MEAL 180 tablet 5  . omeprazole (PRILOSEC) 20 MG capsule Take 20 mg by mouth daily.    . ondansetron (ZOFRAN-ODT) 4 MG disintegrating tablet Take 4 mg by mouth every 8 (eight) hours as needed for nausea or vomiting. Reported on 02/15/2016    . saccharomyces boulardii (FLORASTOR) 250 MG capsule Take 1 capsule (250 mg total) by mouth 2 (two) times daily. (Patient not taking: Reported on 03/20/2016) 90 capsule 0  . senna (SENOKOT) 8.6 MG TABS tablet TAKE ONE TABLET BY MOUTH AT BEDTIME 120 tablet 1  . tamsulosin (FLOMAX) 0.4 MG CAPS capsule Take 1 capsule by mouth daily.    . traMADol (ULTRAM) 50 MG tablet Take by mouth every 6 (six) hours as needed. Reported on 02/15/2016     No current facility-administered medications on file prior to visit.     BP 124/72   Temp 98.3 F (36.8  C) (Oral)   Ht 4\' 11"  (1.499 m)   Wt 121 lb 6.4 oz (55.1 kg)   LMP 08/18/1970   BMI 24.52 kg/m       Objective:   Physical Exam  Constitutional: She is oriented to person, place, and time. She appears well-developed and well-nourished. No distress.  HENT:  Head: Normocephalic and atraumatic.  Right Ear: Hearing, tympanic membrane, external ear and ear canal normal.  Left Ear: Hearing, tympanic membrane, external ear and ear canal normal.  Nose: Nose normal. No mucosal edema or rhinorrhea.  Mouth/Throat: Oropharynx is clear and moist and mucous membranes are normal. No oropharyngeal exudate.  Eyes: Conjunctivae and EOM are normal. Pupils are equal, round, and reactive to light. Right eye exhibits no discharge. Left eye exhibits no discharge. No scleral icterus.  Neck: Normal range of motion. Neck supple. No JVD present. Carotid bruit is not present. No tracheal deviation present. No thyromegaly present.  Cardiovascular: Normal rate, regular rhythm, normal heart sounds and intact distal pulses.  Exam reveals no gallop and no friction rub.   No murmur heard. Pulmonary/Chest: Effort normal and breath sounds normal. No stridor. No respiratory distress. She has no wheezes. She has no rales. She exhibits no tenderness.  Abdominal: Soft. Bowel sounds are normal. She exhibits no distension and no mass. There is no tenderness. There is no rebound and no guarding.  Genitourinary:  Genitourinary Comments: Refused  Musculoskeletal: Normal range of motion. She exhibits no edema, tenderness or deformity.  Lymphadenopathy:    She has no cervical adenopathy.  Neurological: She is alert and oriented to person, place, and time. She has normal reflexes. She displays normal reflexes. No cranial nerve deficit. She exhibits normal muscle tone. Coordination normal.  Skin: Skin is warm and dry. No rash noted. She is not diaphoretic. No erythema. No pallor.  Psychiatric: She has a normal mood and affect. Her  behavior is normal. Judgment and thought content normal.  Nursing note and vitals reviewed.     Assessment & Plan:  1. Routine general medical examination at a health care facility - Follow up in 6 months  - Encouraged a heart healthy diet and to stay active  - Basic metabolic panel - CBC with Differential/Platelet - Hemoglobin A1c -  Hepatic function panel - Lipid panel - TSH - POCT Urinalysis Dipstick (Automated) - DG Bone Density; Future - Vitamin D, 25-hydroxy  2. HYPERTENSION, BENIGN SYSTEMIC - At goal. No change in medications  - Basic metabolic panel - CBC with Differential/Platelet - Hemoglobin A1c - Hepatic function panel - Lipid panel - TSH - POCT Urinalysis Dipstick (Automated) - Vitamin D, 25-hydroxy  3. Uncontrolled type 2 diabetes mellitus with insulin therapy ([place]) - Basic metabolic panel - CBC with Differential/Platelet - Hemoglobin A1c - Hepatic function panel - Lipid panel - TSH - POCT Urinalysis Dipstick (Automated) - DG Bone Density; Future - Vitamin D, 25-hydroxy - Consider increasing Metformin   4. Seizure disorder ([place]) - No recent seizures. Keep follow up with Neurology   5. Hyperlipidemia, unspecified hyperlipidemia type  - Basic metabolic panel - CBC with Differential/Platelet - Hemoglobin A1c - Hepatic function panel - Lipid panel - TSH - POCT Urinalysis Dipstick (Automated) - Vitamin D, 25-hydroxy  6. Vitamin D deficiency  - DG Bone Density; Future - Vitamin D, 25-hydroxy - Consider increasing Vitamin D 7. Need for vaccination with 13-polyvalent pneumococcal conjugate vaccine  - Pneumococcal conjugate vaccine 13-valent IM   Dorothyann Peng, NP

## 2016-10-21 ENCOUNTER — Other Ambulatory Visit: Payer: Self-pay

## 2016-10-21 MED ORDER — METFORMIN HCL 1000 MG PO TABS: 1000.0000 mg | ORAL_TABLET | Freq: Two times a day (BID) | ORAL | 3 refills | Status: AC

## 2016-11-07 ENCOUNTER — Encounter: Payer: Self-pay | Admitting: Adult Health

## 2016-11-07 ENCOUNTER — Ambulatory Visit (INDEPENDENT_AMBULATORY_CARE_PROVIDER_SITE_OTHER): Payer: Medicare HMO | Admitting: Nurse Practitioner

## 2016-11-07 ENCOUNTER — Encounter: Payer: Self-pay | Admitting: Nurse Practitioner

## 2016-11-07 VITALS — BP 125/74 | HR 86 | Ht 59.0 in | Wt 120.6 lb

## 2016-11-07 DIAGNOSIS — I6330 Cerebral infarction due to thrombosis of unspecified cerebral artery: Secondary | ICD-10-CM | POA: Diagnosis not present

## 2016-11-07 DIAGNOSIS — E785 Hyperlipidemia, unspecified: Secondary | ICD-10-CM

## 2016-11-07 DIAGNOSIS — I10 Essential (primary) hypertension: Secondary | ICD-10-CM | POA: Diagnosis not present

## 2016-11-07 DIAGNOSIS — G40909 Epilepsy, unspecified, not intractable, without status epilepticus: Secondary | ICD-10-CM | POA: Diagnosis not present

## 2016-11-07 MED ORDER — LEVETIRACETAM 500 MG PO TABS: 500.0000 mg | ORAL_TABLET | Freq: Two times a day (BID) | ORAL | 3 refills | Status: AC

## 2016-11-07 NOTE — Patient Instructions (Addendum)
Continue on aspirin for stroke prevention Strict control of hypertension with blood pressure goal below 130/90  todays reading 125/74 lipids with LDL cholesterol goal below 70 mg percent  Continue Lipitor diabetes with hemoglobin A1c goal below 6.5%. Last was 7.6 Continue Keppra 500 mg twice daily for seizures will refill Call for any seizure activity Follow up in 1 year

## 2016-11-07 NOTE — Progress Notes (Signed)
I agree with the assessment and plan as directed by NP .The patient is known to me .   Kohl Polinsky, MD  

## 2016-11-07 NOTE — Progress Notes (Signed)
[facility]  PATIENT: April [name] Both DOB: 30-Sep-1940   REASON FOR VISIT: Follow-up for seizure disorder, history of stroke  HISTORY FROM: Patient and niece [name] POA    HISTORY OF PRESENT ILLNESS:April [name] is a 76 years old female, referred by her primary care nurse practitioner Debbrah Alar, accompanied by her niece Meriel Flavors, who is in this process of become her power of attorney, and her niece Zigmund Daniel at today's evaluation,  She had past medical history of hypertension, hyperlipidemia, diabetes, most recent A1c was 10.7, she was admitted to the hospital March 5 to October 25 2014, after she was found by her neighbor that she is confused  Had extensive evaluation during her hospital stay, EEG October 23 2014 showed buildup of occipital spike and slow wave activity from the left  occipital region. It achieves a frequency of 3-4 Hz with some spread noted to the right hemisphere as well. These episodes last from 100 to 155 seconds with normal awake background  activity returning after these periods. Suggestive of subclinical seizure, she was put on Keppra 500 mg twice a day, repeat EEG October 25 2014, showed significant improvement  Echocardiogram ejection fraction 60%, ultrasound of carotid artery showed less than 39% stenosis, anterograde flow of bilateral vertebral system   I have reviewed MRI of brain, MRA of the brain October 21 2014, MRI of the brain October 23 2014: 5 mm focus of restricted diffusion within the inferior right thalamus as above, highly suspicious for a small acute ischemic  infarct. Generalized cerebral atrophy with moderate chronic microvascular ischemic disease. There was no significant medium large size vessel disease by MRA of the brain, Laboratory evaluation showed  LDL 46, A1c 10.7, normal vitamin B12, TSH,   Before she was admitted to the hospital, she lives by herself, has widowed for many years, has no relationship to her only  son, was still driving, but he was noticed by her nieces, who intact with her frequently, she has gradual onset memory trouble, also moody, tends to cry, she has been missing her diabetic, hypertension, hyperlipidemia medication regularly,   Since hospital admission, she was discharged to [facility] place, continue has intermittent memory loss, mild confusion, in addition, she was noted to have increased gait difficulty, urinary urgency, occasionally bladder accident, constipation, with laxative, she occasionally has bowel incontinence, contributed to not able to make to the bathroom by Asst. in timely fashion,   She is tearful emotional during today's interview, she will be moved to a long-term assisted living, her niece is in the process of getting power of attorney  Update 03/23/2015 PS: She is accompanied today by her daughter following her last visit with Dr. Krista Blue in March 2016. She reports overall significant improvement in her confusion and memory difficulties. She's had no recurrent stroke or TIA symptoms since my last hospital visit with her in March 2015 when she had a small right thalamic infarct. She also had a urinary tract infection at that time. Patient is now been diagnosed with a new colonic mass and plans to see GI surgeon Dr. Marcello Moores for consideration for surgery. She is presently living in assisted living and has recently started walking with a walker and is doing well. She is tolerating Keppra well without any side effects. She has had no recurrent seizure episodes. She states her blood pressure is normal and today it is slightly low at 103/67. Her fasting sugars have ranged consistently in the 90 and today was 93 mg  percent on Mini-Mental status exam today she scored 29/30.Marland Kitchen Update 2/2/2017PS : She returns for follow-up after last visit 6 months ago. She is doing well she's had no recurrent seizures. She remains on Keppra. She's had no TIA or stroke symptoms. She lives alone at home. She has  some help with household work and her medications. She feels her memory is stable. Blood pressure is usually well controlled though it is elevated at 161/83 today. She has regular follow-up with her primary care physician. She has chronic urinary tract infections and in fact just finished a course of antibiotics. She is currently seeing urologist Dr. Diona Fanti for that. She plans on getting her annual physical to the end of this month and will have lipid profile checked at that time. She is also seen Dr. Valentina Shaggy last month UPDATE 08/03/2017CM April [name], 76 year old female returns for follow-up. She has not had recurrent seizure events since hospitalization in March 2015. No further stroke or TIA symptoms. She continues to live at home with family checking in on her frequently. No safety issues identified she can manage to do her ADLs. Medications are managed by her niece in a medi planner. She has not fallen. She ambulates with a rolling walker. She had surgery last year for a colon mass. She is having a repeat CT of the abdomen today. She returns for reevaluation UPDATE 03/23/2018CM April [name], 76 year old female returns for follow-up. She has a history of stroke in March 2015. She is currently on aspirin for secondary stroke prevention without recurrent stroke or TIA symptoms. She has no bruising or bleeding. She had recent labs at primary care BMP, CBC, hemoglobin A1c lipid  panel TSH on 10/17/2016. Results reviewed. Hemoglobin A1c 7.6 other labs are currently. Blood pressure in the office today 125/74. She also has a history of seizure disorder without recurrent seizure events since 2015. She is currently on Keppra 500 twice daily she needs refills. She continues to live alone family checks on her frequently. She can manage to do her ADLs independently. Medications are managed by her niece in a medi planner. She uses a rolling walker to ambulate. She denies any falls. She returns for reevaluation   REVIEW  OF SYSTEMS: Full 14 system review of systems performed and notable only for those listed, all others are neg:  Constitutional: neg  Cardiovascular: neg Ear/Nose/Throat: neg  Skin: neg Eyes: Blurred vision Respiratory: neg Gastroitestinal: neg  Hematology/Lymphatic: neg  Endocrine: Excessive thirst, intolerance to cold Musculoskeletal:neg Allergy/Immunology: neg Neurological: History of stroke, seizure disorder Psychiatric: neg Sleep : neg   ALLERGIES: Allergies  Allergen Reactions  . Codeine Nausea And Vomiting  . Sulfa Antibiotics Nausea And Vomiting    HOME MEDICATIONS: Outpatient Medications Prior to Visit  Medication Sig Dispense Refill  . ACCU-CHEK FASTCLIX LANCETS MISC Test once daily. 102 each 5  . aspirin EC 325 MG tablet Take 1 tablet (325 mg total) by mouth daily. 30 tablet 5  . atorvastatin (LIPITOR) 20 MG tablet Take 1 tablet (20 mg total) by mouth daily at 6 PM. 90 tablet 1  . bisacodyl (DULCOLAX) 10 MG suppository Place 1 suppository (10 mg total) rectally daily as needed for moderate constipation. 12 suppository 0  . carvedilol (COREG) 3.125 MG tablet TAKE ONE TABLET BY MOUTH TWICE DAILY WITH A MEAL 180 tablet 3  . Cyanocobalamin (VITAMIN B-12) 1000 MCG SUBL Place 1 tablet (1,000 mcg total) under the tongue daily. 90 tablet 1  . furosemide (LASIX) 40 MG tablet TAKE 1  TABLET BY MOUTH DAILY. IF YOU GAIN MORE THAN 3 POUNDS IN 1 DAY OR 5 POUNDS IN 1 WEEK TAKE AN EXTRA DOSE 180 tablet 2  . glimepiride (AMARYL) 2 MG tablet TAKE ONE TABLET BY MOUTH ONCE DAILY WITH  BREAKFAST 90 tablet 4  . glucose blood (ACCU-CHEK SMARTVIEW) test strip Use daily 100 each 3  . isosorbide dinitrate (ISORDIL) 5 MG tablet TAKE ONE TABLET BY MOUTH THREE TIMES DAILY 90 tablet 4  . levETIRAcetam (KEPPRA) 500 MG tablet Take 1 tablet (500 mg total) by mouth 2 (two) times daily. 180 tablet 3  . metFORMIN (GLUCOPHAGE) 1000 MG tablet Take 1 tablet (1,000 mg total) by mouth 2 (two) times daily with a  meal. 180 tablet 3  . ondansetron (ZOFRAN-ODT) 4 MG disintegrating tablet Take 4 mg by mouth every 8 (eight) hours as needed for nausea or vomiting. Reported on 02/15/2016    . senna (SENOKOT) 8.6 MG TABS tablet TAKE ONE TABLET BY MOUTH AT BEDTIME 120 tablet 1  . tamsulosin (FLOMAX) 0.4 MG CAPS capsule Take 1 capsule by mouth daily.    . baclofen (LIORESAL) 10 MG tablet Take 10 mg by mouth as needed for muscle spasms. Reported on 02/15/2016    . omeprazole (PRILOSEC) 20 MG capsule Take 20 mg by mouth daily.    . traMADol (ULTRAM) 50 MG tablet Take by mouth every 6 (six) hours as needed. Reported on 02/15/2016    . saccharomyces boulardii (FLORASTOR) 250 MG capsule Take 1 capsule (250 mg total) by mouth 2 (two) times daily. (Patient not taking: Reported on 11/07/2016) 90 capsule 0   No facility-administered medications prior to visit.     PAST MEDICAL HISTORY: Past Medical History:  Diagnosis Date  . Aphasia   . Cerebral infarction due to unspecified mechanism   . Chronic diastolic CHF (congestive heart failure), grade I 01/18/2015   ?   Marland Kitchen Diabetes mellitus   . Family history of adverse reaction to anesthesia   . GERD (gastroesophageal reflux disease) 05/06/2013  . Gram-positive cocci bacteremia   . HYPERLIPIDEMIA 10/15/2006   Qualifier: Diagnosis of  By: Beryle Lathe    . Hypertension   . Rectum bleeding   . Retinopathy 06/06/2013   Per eye exam 05/31/13 eye exam, Moderate retinopathy   . 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 Seizures ([place])   . Stroke [facility]) 10/21/2014    PAST SURGICAL HISTORY: Past Surgical History:  Procedure Laterality Date  . ABDOMINAL HYSTERECTOMY  1972  . APPENDECTOMY  1972  . COLONOSCOPY WITH PROPOFOL N/A 04/06/2015   Procedure: COLONOSCOPY WITH PROPOFOL;  Surgeon: Carol Ada, MD;  Location: [facility];  Service: Endoscopy;  Laterality: N/A;  . CYSTOSCOPY W/ URETERAL STENT PLACEMENT Left 02/08/2015   Procedure: CYSTOSCOPY WITH  RETROGRADE left PYELOGRAM/URETERAL STENT PLACEMENT left insertion of foley;  Surgeon: Franchot Gallo, MD;  Location: [facility];  Service: Urology;  Laterality: Left;  . EYE SURGERY  08/2014   bilateral cataract with lens implants  . LAPAROSCOPIC PARTIAL COLECTOMY N/A 05/03/2015   Procedure: LAPAROSCOPIC PARTIAL COLECTOMY;  Surgeon: [name], MD;  Location: [facility];  Service: General;  Laterality: N/A;  . LEFT LEG FRACTURE     broken leg-left in 3 places  . TEE WITHOUT CARDIOVERSION N/A 10/24/2014   Procedure: TRANSESOPHAGEAL ECHOCARDIOGRAM (TEE);  Surgeon: Dorothy Spark, MD;  Location: [facility];  Service: Cardiovascular;  Laterality: N/A;  . TONSILLECTOMY     as child-age 51    FAMILY  HISTORY: Family History  Problem Relation Age of Onset  . Diabetes Mother   . Stomach cancer Mother   . Arthritis Maternal Grandmother   . Heart disease Maternal Grandmother 80    SOCIAL HISTORY: Social History   Social History  . Marital status: Widowed    Spouse name: N/A  . Number of children: 1  . Years of education: 42   Occupational History  . Retired    Social History Main Topics  . Smoking status: Never Smoker  . Smokeless tobacco: Never Used  . Alcohol use No  . Drug use: No  . Sexual activity: Not on file   Other Topics Concern  . Not on file   Social History Narrative   Widowed, husband died in 12/11/2001.   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   Right-handed.   1 cup caffeine per day.   She is being cared for by her two neices.        PHYSICAL EXAM  Vitals:   11/07/16 0818  BP: 125/74  Pulse: 86  Weight: 120 lb 9.6 oz (54.7 kg)  Height: 4\' 11"  (1.499 m)   Body mass index is 24.36 kg/m.  Generalized: Well developed, in no acute distress  Head: normocephalic and atraumatic,. Oropharynx benign   Neck: Supple, no carotid bruits  Cardiac: Regular rate rhythm, no murmur  Musculoskeletal: No deformity   Neurological examination   Mentation: Alert oriented to time, place, history taking. Attention span and concentration appropriate. Recent and remote memory intact.  Follows all commands speech and language fluent.   Cranial nerve II-XII: Pupils were equal round reactive to light extraocular movements were full, visual field were full on confrontational test. Facial sensation and strength were normal. hearing was intact to finger rubbing bilaterally. Uvula tongue midline. head turning and shoulder shrug were normal and symmetric.Tongue protrusion into cheek strength was normal. Motor: normal bulk and tone, full strength in the BUE, BLE, fine finger movements normal, no pronator drift. No focal weakness Sensory: normal and symmetric to light touch, pinprick, and  Vibration, in the upper and lower extremities Coordination: finger-nose-finger, heel-to-shin bilaterally, no dysmetria Reflexes: 1+ upper lower and symmetric plantar responses were flexor bilaterally. Gait and Station: Rising up from seated position without assistance, ambulates with a rolling walker, gait is slow and steady, no difficulty with turns  DIAGNOSTIC DATA (LABS, IMAGING, TESTING) - I reviewed patient records, labs, notes, testing and imaging myself where available.  Lab Results  Component Value Date   WBC 11.5 (H) 10/17/2016   HGB 10.2 (L) 10/17/2016   HCT 32.0 (L) 10/17/2016   MCV 89.5 10/17/2016   PLT 458.0 (H) 10/17/2016      Component Value Date/Time   NA 142 10/17/2016 1007   K 4.7 10/17/2016 1007   CL 103 10/17/2016 1007   CO2 29 10/17/2016 1007   GLUCOSE 153 (H) 10/17/2016 1007   BUN 37 (H) 10/17/2016 1007   CREATININE 1.07 10/17/2016 1007   CREATININE 0.52 05/17/2013 1144   CALCIUM 9.4 10/17/2016 1007   PROT 7.5 10/17/2016 1007   ALBUMIN 3.7 10/17/2016 1007   AST 8 10/17/2016 1007   ALT 8  10/17/2016 1007   ALKPHOS 86 10/17/2016 1007   BILITOT 0.3 10/17/2016 1007   GFRNONAA >60 05/06/2015 0504  GFRAA >60 05/06/2015 0504   Lab Results  Component Value Date   CHOL 120 10/17/2016   HDL 34.80 (L) 10/17/2016   LDLCALC 54 10/17/2016   TRIG 159.0 (H) 10/17/2016   CHOLHDL 3 10/17/2016   Lab Results  Component Value Date   HGBA1C 7.6 (H) 10/17/2016    Lab Results  Component Value Date   TSH 4.76 (H) 10/17/2016      ASSESSMENT AND PLAN April [name] is a 76 y.o. 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 The patient is a current patient of Dr. Leonie Man  who is out of the office today . This note is sent to the work in doctor.      PLAN: Continue to manage stroke risk factors Continue on aspirin for stroke prevention Strict control of hypertension with blood pressure goal below 130/90  todays reading 125/74 lipids with LDL cholesterol goal below 70 mg percent  Continue Lipitor diabetes with hemoglobin A1c goal below 6.5%. Last was 7.6 continue diabetic medications  Continue Keppra 500 mg twice daily for seizures will refill Call for any seizure activity Follow up in 1 year April [name], [facility], [facility], [facility] [address], [place], [place] [address] [phone]

## 2016-11-11 NOTE — Addendum Note (Signed)
Addended by: Sandria Bales B on: 11/11/2016 10:30 AM   Modules accepted: Orders

## 2016-11-20 NOTE — Addendum Note (Signed)
Addended by: Sandria Bales B on: 11/20/2016 08:31 AM   Modules accepted: Orders

## 2016-11-21 ENCOUNTER — Ambulatory Visit
Admission: RE | Admit: 2016-11-21 | Discharge: 2016-11-21 | Disposition: A | Discharge status: Home / Self Care | Payer: Medicare HMO | Source: Ambulatory Visit | Attending: Adult Health | Admitting: Adult Health

## 2016-11-21 ENCOUNTER — Other Ambulatory Visit: Payer: Medicare HMO

## 2016-11-21 DIAGNOSIS — M810 Age-related osteoporosis without current pathological fracture: Secondary | ICD-10-CM | POA: Diagnosis not present

## 2016-11-21 DIAGNOSIS — Z780 Asymptomatic menopausal state: Secondary | ICD-10-CM | POA: Diagnosis not present

## 2016-11-21 DIAGNOSIS — E2839 Other primary ovarian failure: Secondary | ICD-10-CM

## 2016-12-05 ENCOUNTER — Other Ambulatory Visit: Payer: Self-pay | Admitting: Adult Health

## 2016-12-05 DIAGNOSIS — N3000 Acute cystitis without hematuria: Secondary | ICD-10-CM | POA: Diagnosis not present

## 2017-02-07 ENCOUNTER — Other Ambulatory Visit: Payer: Self-pay | Admitting: Adult Health

## 2017-02-13 DIAGNOSIS — H43812 Vitreous degeneration, left eye: Secondary | ICD-10-CM | POA: Diagnosis not present

## 2017-02-13 DIAGNOSIS — H3562 Retinal hemorrhage, left eye: Secondary | ICD-10-CM | POA: Diagnosis not present

## 2017-02-13 DIAGNOSIS — E113393 Type 2 diabetes mellitus with moderate nonproliferative diabetic retinopathy without macular edema, bilateral: Secondary | ICD-10-CM | POA: Diagnosis not present

## 2017-03-10 ENCOUNTER — Other Ambulatory Visit: Payer: Self-pay | Admitting: Adult Health

## 2017-03-11 NOTE — Telephone Encounter (Signed)
Sent to the pharmacy by e-scribe for 6 months.  Pt has upcoming appt on 04/24/17.  Had yearly on 10/17/16.

## 2017-04-06 ENCOUNTER — Telehealth: Payer: Self-pay | Admitting: Adult Health

## 2017-04-18 DIAGNOSIS — 419620001 Death: Secondary | SNOMED CT | POA: Diagnosis not present

## 2017-04-18 DEATH — deceased

## 2017-04-24 ENCOUNTER — Ambulatory Visit: Payer: Medicare HMO | Admitting: Adult Health

## 2017-11-09 ENCOUNTER — Ambulatory Visit: Payer: Medicare HMO | Admitting: Nurse Practitioner
